# Patient Record
Sex: Male | Born: 1955 | Race: White | Hispanic: No | Marital: Married | Smoking: Never smoker
Health system: Southern US, Community
[De-identification: ages and names within clinical notes are randomized; demographics above are authoritative.]

## PROBLEM LIST (undated history)

## (undated) DIAGNOSIS — K219 Gastro-esophageal reflux disease without esophagitis: Secondary | ICD-10-CM

## (undated) DIAGNOSIS — M47816 Spondylosis without myelopathy or radiculopathy, lumbar region: Secondary | ICD-10-CM

## (undated) DIAGNOSIS — I10 Essential (primary) hypertension: Secondary | ICD-10-CM

## (undated) DIAGNOSIS — M1990 Unspecified osteoarthritis, unspecified site: Secondary | ICD-10-CM

## (undated) HISTORY — PX: KNEE SURGERY: SHX244

## (undated) HISTORY — PX: JOINT REPLACEMENT: SHX530

## (undated) HISTORY — PX: HAND SURGERY: SHX662

## (undated) HISTORY — PX: WRIST FRACTURE SURGERY: SHX121

---

## 2012-08-17 ENCOUNTER — Ambulatory Visit (HOSPITAL_COMMUNITY)
Admission: RE | Admit: 2012-08-17 | Discharge: 2012-08-17 | Disposition: A | Discharge status: Home / Self Care | Payer: BC Managed Care – PPO | Source: Ambulatory Visit | Attending: Family Medicine | Admitting: Family Medicine

## 2012-08-17 ENCOUNTER — Other Ambulatory Visit: Payer: Self-pay | Admitting: Family Medicine

## 2012-08-17 DIAGNOSIS — R05 Cough: Secondary | ICD-10-CM

## 2012-08-17 DIAGNOSIS — R042 Hemoptysis: Secondary | ICD-10-CM | POA: Insufficient documentation

## 2012-09-26 ENCOUNTER — Ambulatory Visit (INDEPENDENT_AMBULATORY_CARE_PROVIDER_SITE_OTHER): Payer: BC Managed Care – PPO | Admitting: Orthopedic Surgery

## 2012-09-26 ENCOUNTER — Encounter: Payer: Self-pay | Admitting: Orthopedic Surgery

## 2012-09-26 ENCOUNTER — Ambulatory Visit (INDEPENDENT_AMBULATORY_CARE_PROVIDER_SITE_OTHER): Payer: BC Managed Care – PPO

## 2012-09-26 DIAGNOSIS — M25579 Pain in unspecified ankle and joints of unspecified foot: Secondary | ICD-10-CM | POA: Insufficient documentation

## 2012-09-26 DIAGNOSIS — M19079 Primary osteoarthritis, unspecified ankle and foot: Secondary | ICD-10-CM | POA: Insufficient documentation

## 2012-09-26 MED ORDER — NABUMETONE 500 MG PO TABS: 500.0000 mg | ORAL_TABLET | Freq: Two times a day (BID) | ORAL | Status: DC

## 2012-09-26 MED ORDER — TRAMADOL-ACETAMINOPHEN 37.5-325 MG PO TABS: 1.0000 | ORAL_TABLET | ORAL | Status: DC | PRN

## 2012-09-26 NOTE — Patient Instructions (Addendum)
Ankle Arthritis  Syndesmosis injury

## 2012-09-26 NOTE — Progress Notes (Signed)
Patient ID: Dillon [name], male   DOB: 11/10/1955, 57 y.o.   MRN: [id number] Chief Complaint  Patient presents with  . 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  He notices that he has some pain when he is ambulating and he has difficulty going up inclines and the swelling is gone is pretty severe and is interfering with shoe wear. Review of systems  His review of systems shows that he has a history of snoring and has a musculoskeletal complaints all others complaints and her review of systems were negative  BP 122/70  Ht 6' (1.829 m)  Wt 145 lb 3.2 oz (65.862 kg)  BMI 19.69 kg/m2 His general appearance is normal is oriented x3 his mood and affect are normal his gait and station show he has a noticeable limp with swelling of his right ankle. He has maintained his ankle dorsiflexion which is painful he has some joint line tenderness anteromedially he has swelling on the soft tissue laterally he does have 20 of dorsiflexion 20 of plantarflexion inversion eversion painless stability normal strength normal skin normal good pulse normal temperature no edema  X-rays were done in the office order was placed and the x-rays were noted to show severe ankle arthritis abnormal mortise tib-fib syndesmotic calcifications and ossifications bone outside the ankle mortise lateral to the malleolus and a old avulsion type fracture the medial malleolus  Impression OA  (osteoarthritis) of ankle, right - Plan: traMADol-acetaminophen (ULTRACET) 37.5-325 MG per tablet, nabumetone (RELAFEN) 500 MG tablet, DISCONTINUED: nabumetone (RELAFEN) 500 MG tablet, DISCONTINUED: traMADol-acetaminophen (ULTRACET) 37.5-325 MG per tablet  Pain in joint, ankle and foot - Plan: DG Ankle Complete Right, traMADol-acetaminophen (ULTRACET) 37.5-325 MG per tablet, nabumetone (RELAFEN) 500 MG tablet, DISCONTINUED: nabumetone (RELAFEN) 500 MG tablet, DISCONTINUED: traMADol-acetaminophen (ULTRACET) 37.5-325 MG per tablet   Recommend soft ASO brace for now anti-inflammatories pain medication and we discussed possible fusion and he asked about joint replacement  I recommend a six-month x-ray and close evaluation regarding his motion and function if he deteriorates we will have to address the possibility of ankle replacement versus ankle effusion

## 2012-12-04 ENCOUNTER — Other Ambulatory Visit: Payer: Self-pay | Admitting: Orthopedic Surgery

## 2012-12-04 DIAGNOSIS — M25579 Pain in unspecified ankle and joints of unspecified foot: Secondary | ICD-10-CM

## 2012-12-04 MED ORDER — TRAMADOL-ACETAMINOPHEN 37.5-325 MG PO TABS: 1.0000 | ORAL_TABLET | ORAL | Status: DC | PRN

## 2013-03-19 ENCOUNTER — Other Ambulatory Visit: Payer: Self-pay | Admitting: *Deleted

## 2013-03-19 MED ORDER — NABUMETONE 500 MG PO TABS: 500.0000 mg | ORAL_TABLET | Freq: Two times a day (BID) | ORAL | Status: DC

## 2013-03-27 ENCOUNTER — Ambulatory Visit: Payer: BC Managed Care – PPO | Admitting: Orthopedic Surgery

## 2013-03-28 ENCOUNTER — Encounter: Payer: Self-pay | Admitting: Orthopedic Surgery

## 2013-03-28 ENCOUNTER — Ambulatory Visit (INDEPENDENT_AMBULATORY_CARE_PROVIDER_SITE_OTHER): Payer: BC Managed Care – PPO | Admitting: Orthopedic Surgery

## 2013-03-28 ENCOUNTER — Ambulatory Visit (INDEPENDENT_AMBULATORY_CARE_PROVIDER_SITE_OTHER): Payer: BC Managed Care – PPO

## 2013-03-28 VITALS — BP 146/92 | Ht 72.0 in | Wt 240.0 lb

## 2013-03-28 DIAGNOSIS — M19071 Primary osteoarthritis, right ankle and foot: Secondary | ICD-10-CM

## 2013-03-28 DIAGNOSIS — M19079 Primary osteoarthritis, unspecified ankle and foot: Secondary | ICD-10-CM

## 2013-03-28 DIAGNOSIS — M25579 Pain in unspecified ankle and joints of unspecified foot: Secondary | ICD-10-CM

## 2013-03-28 MED ORDER — NABUMETONE 500 MG PO TABS: 500.0000 mg | ORAL_TABLET | Freq: Two times a day (BID) | ORAL | Status: DC

## 2013-03-28 MED ORDER — TRAMADOL-ACETAMINOPHEN 37.5-325 MG PO TABS: 1.0000 | ORAL_TABLET | ORAL | Status: DC | PRN

## 2013-03-28 NOTE — Patient Instructions (Addendum)
Continue medications

## 2013-03-28 NOTE — Progress Notes (Signed)
Patient ID: Dillon [name], male   DOB: 08-31-1955, 57 y.o.   MRN: [id number]  Chief Complaint  Patient presents with  . Follow-up    6 month recheck right ankle with xray    Ankle arthritis followup x-ray is from 6 months. 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 X-ray in 6 months. Diagnosis ankle arthritis right ankle

## 2013-10-22 ENCOUNTER — Other Ambulatory Visit: Payer: Self-pay | Admitting: *Deleted

## 2013-10-22 DIAGNOSIS — M1990 Unspecified osteoarthritis, unspecified site: Secondary | ICD-10-CM

## 2013-10-22 MED ORDER — TRAMADOL-ACETAMINOPHEN 37.5-325 MG PO TABS: 1.0000 | ORAL_TABLET | ORAL | Status: DC | PRN

## 2014-03-11 ENCOUNTER — Other Ambulatory Visit: Payer: Self-pay | Admitting: *Deleted

## 2014-03-11 MED ORDER — NABUMETONE 500 MG PO TABS: 500.0000 mg | ORAL_TABLET | Freq: Two times a day (BID) | ORAL | Status: DC

## 2014-03-25 ENCOUNTER — Encounter: Payer: Self-pay | Admitting: Orthopedic Surgery

## 2014-03-25 ENCOUNTER — Ambulatory Visit (INDEPENDENT_AMBULATORY_CARE_PROVIDER_SITE_OTHER): Payer: BC Managed Care – PPO | Admitting: Orthopedic Surgery

## 2014-03-25 ENCOUNTER — Ambulatory Visit (INDEPENDENT_AMBULATORY_CARE_PROVIDER_SITE_OTHER): Payer: BC Managed Care – PPO

## 2014-03-25 VITALS — BP 135/88 | Ht 72.0 in | Wt 241.0 lb

## 2014-03-25 DIAGNOSIS — M1990 Unspecified osteoarthritis, unspecified site: Secondary | ICD-10-CM

## 2014-03-25 DIAGNOSIS — M19079 Primary osteoarthritis, unspecified ankle and foot: Secondary | ICD-10-CM

## 2014-03-25 DIAGNOSIS — M129 Arthropathy, unspecified: Secondary | ICD-10-CM

## 2014-03-25 MED ORDER — TRAMADOL-ACETAMINOPHEN 37.5-325 MG PO TABS: 1.0000 | ORAL_TABLET | ORAL | Status: DC | PRN

## 2014-03-25 NOTE — Progress Notes (Signed)
Followup visit  One year followup for osteoarthritis of the right ankle and tearing ankle joint  Fortunately the patient was able to continue with his activities of daily living and recreational activities which include but are not limited to the following  He walked 18 holes of golf when he wants to. He mows his lawn with a push mower. He coaches. He can do this he is having some pain but is getting good relief from Relafen 500 mg twice a day and Ultracet one to 2 tablets per day  Review of systems he did have some knee pain it was transient and resolved  Fortunately he still has about 20 of total motion in his flexion extension arc minimal pronation supination. Neurovascular exam is intact he walks without assist device  His vital signs are stable his appearance x3 his mood was excellent skin was intact there were no sensory deficits there is deformity of the ankle medial and lateral from extra-articular bone formation  X-rays show deteriorating arthritis of the ankle joint  Recommend continue current medications followup in a year repeat x-ray unless symptoms change

## 2014-04-15 ENCOUNTER — Telehealth: Payer: Self-pay | Admitting: Orthopedic Surgery

## 2014-04-15 ENCOUNTER — Other Ambulatory Visit: Payer: Self-pay | Admitting: *Deleted

## 2014-04-15 MED ORDER — NABUMETONE 500 MG PO TABS: 500.0000 mg | ORAL_TABLET | Freq: Two times a day (BID) | ORAL | Status: DC

## 2014-04-15 NOTE — Telephone Encounter (Signed)
Patient called over holiday weekend and left voice message regarding request for refill on Nabumetone 500 mg, [employer].  Called back to patient - left message, regarding calling pharmacy and ask that they fax a request.

## 2014-04-15 NOTE — Telephone Encounter (Signed)
Ok to refill 

## 2014-09-19 ENCOUNTER — Other Ambulatory Visit: Payer: Self-pay | Admitting: *Deleted

## 2014-09-19 DIAGNOSIS — M1990 Unspecified osteoarthritis, unspecified site: Secondary | ICD-10-CM

## 2014-09-19 MED ORDER — TRAMADOL-ACETAMINOPHEN 37.5-325 MG PO TABS: 1.0000 | ORAL_TABLET | ORAL | Status: DC | PRN

## 2014-09-19 MED ORDER — NABUMETONE 500 MG PO TABS: 500.0000 mg | ORAL_TABLET | Freq: Two times a day (BID) | ORAL | Status: DC

## 2015-01-10 ENCOUNTER — Emergency Department (HOSPITAL_COMMUNITY)
Admission: EM | Admit: 2015-01-10 | Discharge: 2015-01-11 | Disposition: A | Discharge status: Home / Self Care | Payer: BC Managed Care – PPO | Attending: Emergency Medicine | Admitting: Emergency Medicine

## 2015-01-10 ENCOUNTER — Encounter (HOSPITAL_COMMUNITY): Payer: Self-pay | Admitting: *Deleted

## 2015-01-10 DIAGNOSIS — Z79899 Other long term (current) drug therapy: Secondary | ICD-10-CM | POA: Insufficient documentation

## 2015-01-10 DIAGNOSIS — L03116 Cellulitis of left lower limb: Secondary | ICD-10-CM | POA: Diagnosis not present

## 2015-01-10 DIAGNOSIS — R2242 Localized swelling, mass and lump, left lower limb: Secondary | ICD-10-CM | POA: Diagnosis present

## 2015-01-10 DIAGNOSIS — L02416 Cutaneous abscess of left lower limb: Secondary | ICD-10-CM | POA: Diagnosis not present

## 2015-01-10 NOTE — ED Notes (Signed)
Pt ? Insect bite, c/o left leg swelling and is able to "squeeze pus" from area on the back of the calf area,

## 2015-01-10 NOTE — ED Provider Notes (Signed)
CSN: [id number]     Arrival date & time 01/10/15  2248 History  This chart was scribed for Jola Schmidt, MD by Terressa Koyanagi, ED Scribe. This patient was seen in room APA03/APA03 and the patient's care was started at 11:55 PM.  Chief Complaint  Patient presents with  . Leg Swelling   The history is provided by the patient.   PCP: Mickie Hillier, MD HPI Comments: Dillon [name] is a 59 y.o. male, with PMH noted below, who presents to the Emergency Department complaining of sudden onset, mildly improving swelling of the left leg with associated drainage. began yesterday. No hx of DM. Wife became concerned because of the spreading erythema and warmth down his left calf. No signficant pain per pt. No hx of abscesses. No recent injury. Reports he squeezed the area tonight and "pus" came out  History reviewed. No pertinent past medical history. Past Surgical History  Procedure Laterality Date  . Knee surgery    . Wrist fracture surgery     Family History  Problem Relation Age of Onset  . Cancer Father    History  Substance Use Topics  . Smoking status: Never Smoker   . Smokeless tobacco: Not on file  . Alcohol Use: Yes    Review of Systems A complete 10 system review of systems was obtained and all systems are negative except as noted in the HPI and PMH.  Allergies  Review of patient's allergies indicates no known allergies.  Home Medications   Prior to Admission medications   Medication Sig Start Date End Date Taking? Authorizing Provider  nabumetone (RELAFEN) 500 MG tablet Take 1 tablet (500 mg total) by mouth 2 (two) times daily. 09/19/14   [name], MD  traMADol-acetaminophen (ULTRACET) 37.5-325 MG per tablet Take 1 tablet by mouth every 4 (four) hours as needed. 09/19/14   [name], MD   Triage Vitals: BP 160/92 mmHg  Pulse 71  Temp(Src) 97.9 F (36.6 C) (Oral)  Resp 22  Ht 6\' 1"  (1.854 m)  Wt 235 lb (106.595 kg)  BMI 31.01 kg/m2  SpO2 97% Physical  Exam  Constitutional: He is oriented to person, place, and time. He appears well-developed and well-nourished.  HENT:  Head: Normocephalic.  Eyes: EOM are normal.  Neck: Normal range of motion.  Pulmonary/Chest: Effort normal.  Abdominal: He exhibits no distension.  Musculoskeletal: Normal range of motion.  Fluctuant welling left proximal tibia on the lateral aspect, surrounding erythema involving most of this left lower leg from the popliteal space to the left ankle. No crepitus  Neurological: He is alert and oriented to person, place, and time.  Skin:     Psychiatric: He has a normal mood and affect.  Nursing note and vitals reviewed.   ED Course  Procedures  INCISION AND DRAINAGE Performed by:  Jola Schmidt, MD  Consent: Verbal consent obtained. Risks and benefits: risks, benefits and alternatives were discussed Type: abscess Body area: left lower leg Anesthesia: local infiltration Incision was made with a scalpel. Local anesthetic: lidocaine 2% with epinephrine Anesthetic total: 6cc Complexity: complex Blunt dissection to break up loculations Drainage: none Drainage amount: none Packing material: none Patient tolerance: Patient tolerated the procedure well with no immediate complications.    Labs Review Labs Reviewed - No data to display  Imaging Review No results found.   EKG Interpretation None      MDM   Final diagnoses:  Cellulitis of left lower extremity  Abscess of left lower  extremity    Incision and drainage. No additional pus was removed, majority of what was felt was induration. Wound left open. Started on abx for associated cellulitis. pts wife is a Agricultural consultant. Pt and family understand to return to ER for new or worsening symptoms. Home on bactrim  I personally performed the services described in this documentation, which was scribed in my presence. The recorded information has been reviewed and is accurate.      Jola Schmidt,  MD 01/11/15 [phone]

## 2015-01-11 DIAGNOSIS — L03116 Cellulitis of left lower limb: Secondary | ICD-10-CM | POA: Diagnosis not present

## 2015-01-11 MED ORDER — SULFAMETHOXAZOLE-TRIMETHOPRIM 800-160 MG PO TABS
1.0000 | ORAL_TABLET | Freq: Once | ORAL | Status: AC
  Administered 2015-01-11: 1 via ORAL
  Filled 2015-01-11: qty 1

## 2015-01-11 MED ORDER — POVIDONE-IODINE 10 % EX SOLN
CUTANEOUS | Status: AC
  Administered 2015-01-11
  Filled 2015-01-11: qty 118

## 2015-01-11 MED ORDER — SULFAMETHOXAZOLE-TRIMETHOPRIM 800-160 MG PO TABS
1.0000 | ORAL_TABLET | Freq: Two times a day (BID) | ORAL | Status: AC
Start: 2015-01-11 — End: 2015-01-18

## 2015-01-11 MED ORDER — LIDOCAINE-EPINEPHRINE (PF) 2 %-1:200000 IJ SOLN
20.0000 mL | Freq: Once | INTRAMUSCULAR | Status: AC
  Administered 2015-01-11: 20 mL
  Filled 2015-01-11: qty 20

## 2015-01-12 ENCOUNTER — Other Ambulatory Visit: Payer: Self-pay | Admitting: Obstetrics & Gynecology

## 2015-01-12 MED ORDER — SILVER SULFADIAZINE 1 % EX CREA: TOPICAL_CREAM | CUTANEOUS | Status: DC

## 2015-01-12 NOTE — Telephone Encounter (Signed)
Neighbor with boil on left calf, MRSA vs staph complex On bactrim  Recommend topical silver as well and local care instructions given

## 2015-03-16 ENCOUNTER — Other Ambulatory Visit: Payer: Self-pay | Admitting: *Deleted

## 2015-03-16 DIAGNOSIS — M1990 Unspecified osteoarthritis, unspecified site: Secondary | ICD-10-CM

## 2015-03-16 MED ORDER — TRAMADOL-ACETAMINOPHEN 37.5-325 MG PO TABS: 1.0000 | ORAL_TABLET | ORAL | Status: DC | PRN

## 2015-03-16 MED ORDER — NABUMETONE 500 MG PO TABS: 500.0000 mg | ORAL_TABLET | Freq: Two times a day (BID) | ORAL | Status: DC

## 2015-03-31 ENCOUNTER — Ambulatory Visit: Payer: BC Managed Care – PPO | Admitting: Orthopedic Surgery

## 2015-04-02 ENCOUNTER — Ambulatory Visit: Payer: BLUE CROSS/BLUE SHIELD | Admitting: Orthopedic Surgery

## 2015-04-07 ENCOUNTER — Ambulatory Visit (INDEPENDENT_AMBULATORY_CARE_PROVIDER_SITE_OTHER): Payer: BC Managed Care – PPO | Admitting: Orthopedic Surgery

## 2015-04-07 ENCOUNTER — Ambulatory Visit (INDEPENDENT_AMBULATORY_CARE_PROVIDER_SITE_OTHER): Payer: BC Managed Care – PPO

## 2015-04-07 VITALS — BP 145/97 | Ht 73.0 in | Wt 238.0 lb

## 2015-04-07 DIAGNOSIS — M129 Arthropathy, unspecified: Secondary | ICD-10-CM

## 2015-04-07 DIAGNOSIS — M19079 Primary osteoarthritis, unspecified ankle and foot: Secondary | ICD-10-CM

## 2015-04-07 DIAGNOSIS — M1990 Unspecified osteoarthritis, unspecified site: Secondary | ICD-10-CM | POA: Diagnosis not present

## 2015-04-07 MED ORDER — TRAMADOL-ACETAMINOPHEN 37.5-325 MG PO TABS: 1.0000 | ORAL_TABLET | ORAL | Status: DC | PRN

## 2015-04-07 NOTE — Progress Notes (Signed)
Patient ID: Dillon [name], male   DOB: 1956/04/22, 59 y.o.   MRN: [id number]  Follow up visit  Chief Complaint  Patient presents with  . Follow-up    yearly follow up + xray right ankle    BP 145/97 mmHg  Ht 6\' 1"  (1.854 m)  Wt 238 lb (107.956 kg)  BMI 31.41 kg/m2  Encounter Diagnoses  Name Primary?  Marland Kitchen Arthritis of ankle Yes  . Arthritis     Patient comes in for yearly follow-up has severe osteoarthritis of his right ankle. He is on tramadol and nabumetone doing well. 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 He has no tenderness but the ankle joint is swollen and deformed. His plantar flexion dorsiflexion strength remains normal neurovascular exam is intact  Vital signs are stable. His weight is maintained a good level [id number] with a BMI of 31  X-rays show severe arthritis right ankle compared to last year's films no significant change  Continue tramadol nabumetone follow-up in one year repeat x-rays call us if any symptoms change.

## 2015-09-21 ENCOUNTER — Telehealth: Payer: Self-pay

## 2015-09-21 ENCOUNTER — Other Ambulatory Visit: Payer: Self-pay | Admitting: *Deleted

## 2015-09-21 DIAGNOSIS — M1990 Unspecified osteoarthritis, unspecified site: Secondary | ICD-10-CM

## 2015-09-21 MED ORDER — TRAMADOL-ACETAMINOPHEN 37.5-325 MG PO TABS: 1.0000 | ORAL_TABLET | ORAL | Status: DC | PRN

## 2015-09-21 NOTE — Telephone Encounter (Signed)
Refill request from [facility] for Ultracet 37.5/325mg  take 1 every 4 hours prn, #60.  Please advise.

## 2015-09-21 NOTE — Telephone Encounter (Signed)
Last seen August 2016, ok to refill?

## 2015-09-21 NOTE — Telephone Encounter (Signed)
yes

## 2015-09-22 NOTE — Telephone Encounter (Signed)
Faxed as requested

## 2016-01-28 ENCOUNTER — Ambulatory Visit (INDEPENDENT_AMBULATORY_CARE_PROVIDER_SITE_OTHER): Payer: BC Managed Care – PPO | Admitting: Family Medicine

## 2016-01-28 VITALS — BP 122/74 | Temp 98.4°F | Ht 72.0 in | Wt 234.0 lb

## 2016-01-28 DIAGNOSIS — S30860A Insect bite (nonvenomous) of lower back and pelvis, initial encounter: Secondary | ICD-10-CM | POA: Diagnosis not present

## 2016-01-28 DIAGNOSIS — W57XXXA Bitten or stung by nonvenomous insect and other nonvenomous arthropods, initial encounter: Principal | ICD-10-CM

## 2016-01-28 NOTE — Patient Instructions (Signed)
Tick Bite Information Ticks are insects that attach themselves to the skin and draw blood for food. There are various types of ticks. Common types include wood ticks and deer ticks. Most ticks live in shrubs and grassy areas. Ticks can climb onto your body when you make contact with leaves or grass where the tick is waiting. The most common places on the body for ticks to attach themselves are the scalp, neck, armpits, waist, and groin. Most tick bites are harmless, but sometimes ticks carry germs that cause diseases. These germs can be spread to a person during the tick's feeding process. The chance of a disease spreading through a tick bite depends on:   The type of tick.  Time of year.   How long the tick is attached.   Geographic location.  HOW CAN YOU PREVENT TICK BITES? Take these steps to help prevent tick bites when you are outdoors:  Wear protective clothing. Long sleeves and long pants are best.   Wear white clothes so you can see ticks more easily.  Tuck your pant legs into your socks.   If walking on a trail, stay in the middle of the trail to avoid brushing against bushes.  Avoid walking through areas with long grass.  Put insect repellent on all exposed skin and along boot tops, pant legs, and sleeve cuffs.   Check clothing, hair, and skin repeatedly and before going inside.   Brush off any ticks that are not attached.  Take a shower or bath as soon as possible after being outdoors.  WHAT IS THE PROPER WAY TO REMOVE A TICK? Ticks should be removed as soon as possible to help prevent diseases caused by tick bites. 1. If latex gloves are available, put them on before trying to remove a tick.  2. Using fine-point tweezers, grasp the tick as close to the skin as possible. You may also use curved forceps or a tick removal tool. Grasp the tick as close to its head as possible. Avoid grasping the tick on its body. 3. Pull gently with steady upward pressure until  the tick lets go. Do not twist the tick or jerk it suddenly. This may break off the tick's head or mouth parts. 4. Do not squeeze or crush the tick's body. This could force disease-carrying fluids from the tick into your body.  5. After the tick is removed, wash the bite area and your hands with soap and water or other disinfectant such as alcohol. 6. Apply a small amount of antiseptic cream or ointment to the bite site.  7. Wash and disinfect any instruments that were used.  Do not try to remove a tick by applying a hot match, petroleum jelly, or fingernail polish to the tick. These methods do not work and may increase the chances of disease being spread from the tick bite.  WHEN SHOULD YOU SEEK MEDICAL CARE? Contact your health care provider if you are unable to remove a tick from your skin or if a part of the tick breaks off and is stuck in the skin.  After a tick bite, you need to be aware of signs and symptoms that could be related to diseases spread by ticks. Contact your health care provider if you develop any of the following in the days or weeks after the tick bite:  Unexplained fever.  Rash. A circular rash that appears days or weeks after the tick bite may indicate the possibility of Lyme disease. The rash may resemble   a target with a bull's-eye and may occur at a different part of your body than the tick bite.  Redness and swelling in the area of the tick bite.   Tender, swollen lymph glands.   Diarrhea.   Weight loss.   Cough.   Fatigue.   Muscle, joint, or bone pain.   Abdominal pain.   Headache.   Lethargy or a change in your level of consciousness.  Difficulty walking or moving your legs.   Numbness in the legs.   Paralysis.  Shortness of breath.   Confusion.   Repeated vomiting.    This information is not intended to replace advice given to you by your health care provider. Make sure you discuss any questions you have with your health  care provider.   Document Released: 07/22/2000 Document Revised: 08/15/2014 Document Reviewed: 01/02/2013 Elsevier Interactive Patient Education 2016 Else[employer] Inc.  

## 2016-01-28 NOTE — Progress Notes (Signed)
   Subjective:    Patient ID: Dillon [name], male    DOB: 1956-04-14, 60 y.o.   MRN: [id number]  HPIneed to get tick off of back.   The patient relates that he got bit on his back by a small tic. His wife tried to remove it. Left the head and the skin. There is no fever no chills no rash with this happened earlier today no particular troubles in the past with this.  Review of Systems    see above. Objective:   Physical Exam On exam there is no rash noted. There is a small spot on the lower back which is consistent with a tick bite and appears to have part of the tick still embedded This area was too small to use tweezers #15 blade was used to scrape the area to help achieve relief from this area what appeared to be tick head was removed without difficulty       Assessment & Plan:  Tick bite-information verbally given to the patient further information printed and sent to the patient regarding warning signs regarding tick bites and how to remove them daily cleaning with topical antibiotic ointment for the next 45 days recommended if fevers chills rash or unusual issues follow-up

## 2016-03-28 ENCOUNTER — Telehealth: Payer: Self-pay | Admitting: Orthopedic Surgery

## 2016-03-28 NOTE — Telephone Encounter (Signed)
Tramadol HCL-Acetaminophen 37.5/325mg  Qty 60 Tablets   Take 1 tablet by mouth every 4 (four) hours as needed.

## 2016-03-29 ENCOUNTER — Other Ambulatory Visit: Payer: Self-pay | Admitting: *Deleted

## 2016-03-29 DIAGNOSIS — M1990 Unspecified osteoarthritis, unspecified site: Secondary | ICD-10-CM

## 2016-03-29 MED ORDER — TRAMADOL-ACETAMINOPHEN 37.5-325 MG PO TABS: 1.0000 | ORAL_TABLET | ORAL | 5 refills | Status: DC | PRN

## 2016-03-29 NOTE — Telephone Encounter (Signed)
Okay to refill? 

## 2016-03-29 NOTE — Telephone Encounter (Signed)
approved

## 2016-04-06 ENCOUNTER — Ambulatory Visit (INDEPENDENT_AMBULATORY_CARE_PROVIDER_SITE_OTHER): Payer: BC Managed Care – PPO

## 2016-04-06 ENCOUNTER — Ambulatory Visit (INDEPENDENT_AMBULATORY_CARE_PROVIDER_SITE_OTHER): Payer: BC Managed Care – PPO | Admitting: Orthopedic Surgery

## 2016-04-06 ENCOUNTER — Encounter: Payer: Self-pay | Admitting: Orthopedic Surgery

## 2016-04-06 VITALS — BP 145/95 | HR 72 | Ht 72.0 in | Wt 237.0 lb

## 2016-04-06 DIAGNOSIS — M129 Arthropathy, unspecified: Secondary | ICD-10-CM

## 2016-04-06 DIAGNOSIS — M19079 Primary osteoarthritis, unspecified ankle and foot: Secondary | ICD-10-CM

## 2016-04-06 NOTE — Progress Notes (Signed)
Follow-up arthritis right ankle  Patient has osteoarthritis right ankle currently managed with nabumetone and Ultracet  He did not tolerate double upright bracing  He complains of increased swelling right leg asymmetric when compared to left. He does note increased use of elliptical trainer  Review of systems is negative for any numbness or tingling just positive for the swelling  Exam shows an altered gait favoring his right lower extremity with restricted and painful range of motion and swelling of the right leg painful range of motion in the right ankle. The ankle is stable there is no atrophy skin is somewhat warm  There is no edema on the left  Sensation remains intact  X-rays today compared to last year showed no major changes in his talar tilt lateral joint space narrowing and bone spurs  Impression arthritis right ankle  Plan It's time for consultation with a foot and ankle specialists he wants to go to [facility]  Continue nabumetone and Ultracet  Compression stocking  Follow-up one year repeat x-ray

## 2016-04-06 NOTE — Patient Instructions (Signed)
Referral to Dr. Doreatha Lew at [facility]

## 2016-04-07 ENCOUNTER — Ambulatory Visit: Payer: BC Managed Care – PPO | Admitting: Orthopedic Surgery

## 2016-04-20 ENCOUNTER — Telehealth: Payer: Self-pay | Admitting: Orthopedic Surgery

## 2016-04-20 NOTE — Telephone Encounter (Signed)
Patient called to relay - per referral by Dr Aline Brochure, his appointment has been scheduled at [facility] for 05/10/16.  States will request copy of film from [facility] radiology for this visit.

## 2016-06-14 ENCOUNTER — Ambulatory Visit (INDEPENDENT_AMBULATORY_CARE_PROVIDER_SITE_OTHER): Payer: BC Managed Care – PPO | Admitting: Family Medicine

## 2016-06-14 ENCOUNTER — Encounter: Payer: Self-pay | Admitting: Family Medicine

## 2016-06-14 VITALS — BP 136/94 | Ht 72.0 in | Wt 224.5 lb

## 2016-06-14 DIAGNOSIS — Z0000 Encounter for general adult medical examination without abnormal findings: Secondary | ICD-10-CM | POA: Diagnosis not present

## 2016-06-14 DIAGNOSIS — R030 Elevated blood-pressure reading, without diagnosis of hypertension: Secondary | ICD-10-CM

## 2016-06-14 NOTE — Progress Notes (Signed)
   Subjective:    Patient ID: Dillon [name], male    DOB: 05-05-1956, 60 y.o.   MRN: [id number]  HPI The patient comes in today for a wellness visit.  Flu shot given   A review of their health history was completed.  A review of medications was also completed.  Any needed refills; none   Eating habits: Patient states eating habits are ok. Eats power bar for breakfast, apple for lunch, and dinner.  Falls/  MVA accidents in past few months: None Regular exercise: Patient works out 3 days a week. Does push ups, and coaches football.   Specialist pt sees on regular basis: Patient currently Dr.Harrison for ankle   Preventative health issues were discussed.   Additional concerns: Patient states no other concerns tis visit.   Colon o five yrs ago, next on due in ten yrs from that one   Review of Systems  Constitutional: Negative for activity change, appetite change and fever.  HENT: Negative for congestion and rhinorrhea.   Eyes: Negative for discharge.  Respiratory: Negative for cough and wheezing.   Cardiovascular: Negative for chest pain.  Gastrointestinal: Negative for abdominal pain, blood in stool and vomiting.  Genitourinary: Negative for difficulty urinating and frequency.  Musculoskeletal: Negative for neck pain.  Skin: Negative for rash.  Allergic/Immunologic: Negative for environmental allergies and food allergies.  Neurological: Negative for weakness and headaches.  Psychiatric/Behavioral: Negative for agitation.  All other systems reviewed and are negative.      Objective:   Physical Exam  Constitutional: He appears well-developed and well-nourished.  Blood pressure 132/92 on repeat  HENT:  Head: Normocephalic and atraumatic.  Right Ear: External ear normal.  Left Ear: External ear normal.  Nose: Nose normal.  Mouth/Throat: Oropharynx is clear and moist.  Eyes: EOM are normal. Pupils are equal, round, and reactive to light.  Neck: Normal range of motion.  Neck supple. No thyromegaly present.  Cardiovascular: Normal rate, regular rhythm and normal heart sounds.   No murmur heard. Pulmonary/Chest: Effort normal and breath sounds normal. No respiratory distress. He has no wheezes.  Abdominal: Soft. Bowel sounds are normal. He exhibits no distension and no mass. There is no tenderness.  Genitourinary: Penis normal.  Musculoskeletal: Normal range of motion. He exhibits no edema.  Right ankle chronic inflammatory post fracture changes noted  Lymphadenopathy:    He has no cervical adenopathy.  Neurological: He is alert. He exhibits normal muscle tone.  Skin: Skin is warm and dry. No erythema.  Psychiatric: He has a normal mood and affect. His behavior is normal. Judgment normal.  Vitals reviewed.  Prostate within normal limits       Assessment & Plan:  Impression 1 wellness exam #2 elevated blood pressure too early yet to consider medications. Patient feels it is due to his chronic prednisone and other meds which he hopes to stop soon after surgery. #3 pending right ankle surgery plan shingles vaccine. Appropriate blood work. Patient states up to date on colonoscopy

## 2016-06-15 LAB — BASIC METABOLIC PANEL
BUN / CREAT RATIO: 12 (ref 10–24)
BUN: 11 mg/dL (ref 8–27)
CALCIUM: 9.5 mg/dL (ref 8.6–10.2)
CHLORIDE: 102 mmol/L (ref 96–106)
CO2: 26 mmol/L (ref 18–29)
Creatinine, Ser: 0.9 mg/dL (ref 0.76–1.27)
GFR, EST AFRICAN AMERICAN: 107 mL/min/{1.73_m2} (ref >59)
GFR, EST NON AFRICAN AMERICAN: 93 mL/min/{1.73_m2} (ref >59)
Glucose: 87 mg/dL (ref 65–99)
POTASSIUM: 4.2 mmol/L (ref 3.5–5.2)
SODIUM: 144 mmol/L (ref 134–144)

## 2016-06-15 LAB — LIPID PANEL
CHOLESTEROL TOTAL: 191 mg/dL (ref 100–199)
Chol/HDL Ratio: 3.1 ratio units (ref 0.0–5.0)
HDL: 62 mg/dL (ref >39)
LDL CALC: 111 mg/dL — AB (ref 0–99)
TRIGLYCERIDES: 91 mg/dL (ref 0–149)
VLDL Cholesterol Cal: 18 mg/dL (ref 5–40)

## 2016-06-15 LAB — HEPATIC FUNCTION PANEL
ALBUMIN: 4.3 g/dL (ref 3.6–4.8)
ALK PHOS: 81 IU/L (ref 39–117)
ALT: 21 IU/L (ref 0–44)
AST: 19 IU/L (ref 0–40)
BILIRUBIN TOTAL: 0.5 mg/dL (ref 0.0–1.2)
BILIRUBIN, DIRECT: 0.14 mg/dL (ref 0.00–0.40)
Total Protein: 6.6 g/dL (ref 6.0–8.5)

## 2016-06-15 LAB — PSA: Prostate Specific Ag, Serum: 1.5 ng/mL (ref 0.0–4.0)

## 2016-06-17 ENCOUNTER — Encounter: Payer: Self-pay | Admitting: Family Medicine

## 2016-06-29 ENCOUNTER — Other Ambulatory Visit (HOSPITAL_COMMUNITY): Payer: Self-pay | Admitting: Orthopedic Surgery

## 2016-06-29 DIAGNOSIS — M19171 Post-traumatic osteoarthritis, right ankle and foot: Secondary | ICD-10-CM

## 2016-07-07 ENCOUNTER — Ambulatory Visit (HOSPITAL_COMMUNITY)
Admission: RE | Admit: 2016-07-07 | Discharge: 2016-07-07 | Disposition: A | Discharge status: Home / Self Care | Payer: BC Managed Care – PPO | Source: Ambulatory Visit | Attending: Orthopedic Surgery | Admitting: Orthopedic Surgery

## 2016-07-07 DIAGNOSIS — X58XXXA Exposure to other specified factors, initial encounter: Secondary | ICD-10-CM | POA: Insufficient documentation

## 2016-07-07 DIAGNOSIS — S96811A Strain of other specified muscles and tendons at ankle and foot level, right foot, initial encounter: Secondary | ICD-10-CM | POA: Diagnosis not present

## 2016-07-07 DIAGNOSIS — M19171 Post-traumatic osteoarthritis, right ankle and foot: Secondary | ICD-10-CM | POA: Insufficient documentation

## 2016-07-07 DIAGNOSIS — M24071 Loose body in right ankle: Secondary | ICD-10-CM | POA: Diagnosis not present

## 2016-08-30 ENCOUNTER — Encounter (HOSPITAL_COMMUNITY): Payer: Self-pay | Admitting: Physical Therapy

## 2016-08-30 ENCOUNTER — Ambulatory Visit (HOSPITAL_COMMUNITY): Payer: BC Managed Care – PPO | Attending: Orthopedic Surgery | Admitting: Physical Therapy

## 2016-08-30 DIAGNOSIS — R2689 Other abnormalities of gait and mobility: Secondary | ICD-10-CM

## 2016-08-30 DIAGNOSIS — M25671 Stiffness of right ankle, not elsewhere classified: Secondary | ICD-10-CM | POA: Diagnosis not present

## 2016-08-30 DIAGNOSIS — M25571 Pain in right ankle and joints of right foot: Secondary | ICD-10-CM

## 2016-08-30 DIAGNOSIS — R600 Localized edema: Secondary | ICD-10-CM | POA: Diagnosis present

## 2016-08-30 NOTE — Therapy (Signed)
[place], [place], [address] Phone: [phone]   Fax:  [phone]  Physical Therapy Evaluation  Patient Details  Name: Dillon [name] MRN: [id number] Date of Birth: 1956-02-24 Referring Provider: Lennox Grumbles, MD  Encounter Date: 08/30/2016      PT End of Session - 08/30/16 1641    Visit Number 1   Number of Visits 16   Date for PT Re-Evaluation 09/27/16   Authorization Type BCBS   Authorization Time Period 08/30/16 to 10/25/16   PT Start Time 1437   PT Stop Time 1515   PT Time Calculation (min) 38 min   Equipment Utilized During Treatment Other (comment)  Rt CAM boot and axillary crutches   Activity Tolerance Patient tolerated treatment well;No increased pain   Behavior During Therapy [facility] for tasks assessed/performed      History reviewed. No pertinent past medical history.  Past Surgical History:  Procedure Laterality Date  . KNEE SURGERY    . WRIST FRACTURE SURGERY      There were no vitals filed for this visit.       Subjective Assessment - 08/30/16 1439    Subjective Pt reports getting hit on the sidelines during a football game. He had knee surgery, but never realized something was also wrong with is ankle. His ankle has progressively gotten more painful to the point where he could hardly even walk forwards and participate in any leisure activity. He underwent Rt ankel replacement on 07/22/16. He just had an MD appointment yesterday and his cast. He is now in a boot and using crutches.    Pertinent History Rt meniscectomy 7 years ago   Limitations Walking   How long can you sit comfortably? unlimited    How long can you stand comfortably? unlimited, but unable to bear weight    Patient Stated Goals improve mobility and get back to golfing   Currently in Pain? No/denies            [facility] PT Assessment - 08/30/16 0001      Assessment   Medical Diagnosis Rt ankle replacement    Referring Provider Lennox Grumbles, MD   Onset Date/Surgical Date 07/22/16   Next MD Visit 10/10/16   Prior Therapy None      Precautions   Precautions Other (comment)   Precaution Comments ankle replacement protocol     Restrictions   Weight Bearing Restrictions Yes   Other Position/Activity Restrictions see copy of WB protocol in epic     Balance Screen   Has the patient fallen in the past 6 months Yes   How many times? 2  caused by crutches   Has the patient had a decrease in activity level because of a fear of falling?  No   Is the patient reluctant to leave their home because of a fear of falling?  No     Home Ecologist residence     Prior Function   Level of Independence Independent   Vocation Full time employment   Vocation Requirements coaches football      Cognition   Overall Cognitive Status Within Functional Limits for tasks assessed     Observation/Other Assessments   Observations Rt foot pink, swelling noted, surgical incision covering by steri strips      Sensation   Additional Comments numbness and tingling from incision down     ROM / Strength   AROM / PROM / Strength  AROM;Strength     AROM   AROM Assessment Site Ankle   Right/Left Ankle Right   Right Ankle Dorsiflexion --  -5 deg; PROM to 0 deg    Right Ankle Plantar Flexion 5   Right Ankle Inversion 0   Right Ankle Eversion 0     Strength   Strength Assessment Site Ankle   Right/Left Ankle Right   Right Ankle Dorsiflexion 1/5   Right Ankle Plantar Flexion 1/5     Ambulation/Gait   Pre-Gait Activities Ascend/descend 4, 6" steps with B axillary crutches, Rt CAM boot and step to pattern. 1 episode of near fall with self correction during descending portion of activity.   Gait Comments ambulates with B axillary crutches, Rt CAM boot, 25% weight bearing      Balance   Balance Assessed No  unable at this time                   [facility] Adult PT  Treatment/Exercise - 08/30/16 0001      Exercises   Exercises Ankle     Ankle Exercises: Stretches   Soleus Stretch 30 seconds;2 reps   Soleus Stretch Limitations long sitting    Gastroc Stretch 2 reps;30 seconds   Gastroc Stretch Limitations long sitting      Ankle Exercises: Supine   Other Supine Ankle Exercises ankle DF x10 reps, ankle PF x10 reps                 PT Education - 08/30/16 1638    Education provided Yes   Education Details reviewed weight bearing status and purpose of the CAM boot to allow surgical site to heal appropriately; reviewed HEP/exercises provided by MD to address limitations in ankle ROM; proper adjustment of axillary crutches   Person(s) Educated Patient   Methods Explanation;Demonstration;Handout;Verbal cues   Comprehension Returned demonstration;Verbalized understanding          PT Short Term Goals - 08/30/16 1748      PT SHORT TERM GOAL #1   Title Pt will demo consistency and independence with his HEP to improve ankle ROM and strength.    Time 2   Period Weeks   Status New     PT SHORT TERM GOAL #2   Title Pt will demo improved ankle DF PROM to atleast 10 deg, to prepare for ambulation out of the CAM boot.    Time 4   Period Weeks   Status New     PT SHORT TERM GOAL #3   Title Pt will demo understanding of the importance of edema/inflammation management evident by his ability to verbalize ice/elevation parameters.    Time 2   Period Weeks   Status New     PT SHORT TERM GOAL #4   Title Pt will ascend/descend atleast 4, 6" steps with LRAD, 50% weightbearing and without LOB, to improve safety with stair negotiation at home.    Time 2   Period Weeks   Status New           PT Long Term Goals - 08/30/16 1752      PT LONG TERM GOAL #1   Title Pt will demo ankle AROM to atleast 10 deg, to improve foot clearance with ambulation.    Time 8   Period Weeks   Status New     PT LONG TERM GOAL #2   Title Pt will demo improved  ankle PF AROM to atleast 45 degrees to allow him to return  to golfing activities with his peers.   Time 8   Period Weeks   Status New     PT LONG TERM GOAL #3   Title Pt will perform SLS on each LE for up to 15 sec on a firm surface, to demonstrate improved proprioception during weight bearing activity.    Time 8   Period Weeks   Status New     PT LONG TERM GOAL #4   Title Pt will demo ankle DF and PF strength that is atleast 4/5 MMT, to allow him to ambulate without difficulty.   Time 8   Period Weeks   Status New               Plan - 08/30/16 1643    Clinical Impression Statement Pt is a pleasant 60yo M referred to OPPT s/p Rt ankle replacement on 07/22/17. He spent almost 6 weeks in a hard cast which was just removed yesterday, and he was placed in a CAM boot with weight bearing restrictions for the next several weeks. Upon inspection, pt's Rt foot is pink and there is notable swelling, steri strips intact. He demonstrates significant limitations in ankle strength and ROM as expected following immobilization, with no more than 0-5 degrees of DF/PF on the Rt. He is using his crutches with fair stability, reporting 2 falls secondary to poor crutch placement. Otherwise he is following his weight bearing restrictions without cues and demonstrates the ability to don/doff his CAM boot without assistance. He was provided with several exercises by his surgeon and these were reviewed with the exception of a few exercises until further information can be obtained regarding any other restrictions. He would benefit from skilled PT to address his limitations in ROM/strength and mobility to facilitate return to demands of his job, home and leisure activity.    Rehab Potential Good   PT Frequency 2x / week   PT Duration 8 weeks   PT Treatment/Interventions ADLs/Self Care Home Management;Cryotherapy;Therapeutic exercise;Therapeutic activities;Functional mobility training;Stair training;Gait  training;Balance training;Neuromuscular re-education;Patient/family education;Orthotic Fit/Training;Manual techniques;Scar mobilization;Passive range of motion;Dry needling;Taping   PT Next Visit Plan Follow with MD protocol; ankle ROM: DF/PF; manual to address swelling   PT Home Exercise Plan ankle pumps, ankle DF stretch (gastroc and soleus) x30 sec hold   Recommended Other Services possible compression garment for swelling management   Consulted and Agree with Plan of Care Patient      Patient will benefit from skilled therapeutic intervention in order to improve the following deficits and impairments:  Abnormal gait, Decreased activity tolerance, Decreased strength, Increased fascial restricitons, Impaired flexibility, Pain, Improper body mechanics, Increased edema, Decreased scar mobility, Decreased range of motion, Decreased balance, Decreased endurance, Decreased mobility, Decreased skin integrity, Difficulty walking, Hypomobility, Impaired sensation  Visit Diagnosis: Stiffness of right ankle, not elsewhere classified - Plan: PT plan of care cert/re-cert  Pain in right ankle and joints of right foot - Plan: PT plan of care cert/re-cert  Other abnormalities of gait and mobility - Plan: PT plan of care cert/re-cert  Localized edema - Plan: PT plan of care cert/re-cert     Problem List Patient Active Problem List   Diagnosis Date Noted  . OA (osteoarthritis) of ankle 09/26/2012  . Pain in joint, ankle and foot 09/26/2012     6:10 PM,08/30/16 Elly Modena PT, DPT [facility] [place] [address] [place], [place], [address] Phone: [phone]   Fax:  [phone]  Name: Dillon [name] MRN: [id number] Date of Birth: 12-Feb-1956

## 2016-09-01 ENCOUNTER — Telehealth (HOSPITAL_COMMUNITY): Payer: Self-pay | Admitting: Physical Therapy

## 2016-09-01 ENCOUNTER — Ambulatory Visit (HOSPITAL_COMMUNITY): Payer: BC Managed Care – PPO

## 2016-09-01 DIAGNOSIS — M25671 Stiffness of right ankle, not elsewhere classified: Secondary | ICD-10-CM

## 2016-09-01 DIAGNOSIS — R2689 Other abnormalities of gait and mobility: Secondary | ICD-10-CM

## 2016-09-01 DIAGNOSIS — M25571 Pain in right ankle and joints of right foot: Secondary | ICD-10-CM

## 2016-09-01 DIAGNOSIS — R600 Localized edema: Secondary | ICD-10-CM

## 2016-09-01 NOTE — Therapy (Signed)
[place], [place], [address] Phone: [phone]   Fax:  [phone]  Physical Therapy Treatment  Patient Details  Name: Dillon [name] MRN: [id number] Date of Birth: Apr 29, 1956 Referring Provider: Lennox Grumbles, MD  Encounter Date: 09/01/2016      PT End of Session - 09/01/16 1556    Visit Number 2   Number of Visits 16   Date for PT Re-Evaluation 09/27/16   Authorization Type BCBS   Authorization Time Period 08/30/16 to 10/25/16   PT Start Time 1521   PT Stop Time 1600   PT Time Calculation (min) 39 min   Equipment Utilized During Treatment --  Rt CAM boot with axillary crutches   Activity Tolerance Patient tolerated treatment well;No increased pain   Behavior During Therapy [facility] for tasks assessed/performed      No past medical history on file.  Past Surgical History:  Procedure Laterality Date  . KNEE SURGERY    . WRIST FRACTURE SURGERY      There were no vitals filed for this visit.      Subjective Assessment - 09/01/16 1513    Subjective Pt stated he has been compliant with MD therex.  No reoprts of pain currently.  Stated edema has reduced   Pertinent History Rt meniscectomy 7 years ago   Patient Stated Goals improve mobility and get back to golfing   Currently in Pain? No/denies            [facility] Adult PT Treatment/Exercise - 09/01/16 0001      Ambulation/Gait   Pre-Gait Activities Ascend/descend 2 flights of stairs (7in) step to pattern wtih 2 crutches then 1 with handrail A   Gait Comments ambulates with B axillary crutches, Rt CAM boot, 25% weight bearing      Manual Therapy   Manual Therapy Edema management;Passive ROM   Manual therapy comments Manual techniques complete separate rest of tx   Edema Management Retro massage with LE elevated   Passive ROM Passive PROM each direction with prolonged stretches     Ankle Exercises: Seated   ABC's 1 rep   Ankle Circles/Pumps 20  reps  both directions   Other Seated Ankle Exercises 2x 10 DF/PF passive stretches   Other Seated Ankle Exercises 2x 10 Inv/Ev passive stretches     Ankle Exercises: Stretches   Gastroc Stretch 3 reps;30 seconds   Gastroc Stretch Limitations long sitting                   PT Short Term Goals - 08/30/16 1748      PT SHORT TERM GOAL #1   Title Pt will demo consistency and independence with his HEP to improve ankle ROM and strength.    Time 2   Period Weeks   Status New     PT SHORT TERM GOAL #2   Title Pt will demo improved ankle DF PROM to atleast 10 deg, to prepare for ambulation out of the CAM boot.    Time 4   Period Weeks   Status New     PT SHORT TERM GOAL #3   Title Pt will demo understanding of the importance of edema/inflammation management evident by his ability to verbalize ice/elevation parameters.    Time 2   Period Weeks   Status New     PT SHORT TERM GOAL #4   Title Pt will ascend/descend atleast 4, 6" steps with LRAD, 50% weightbearing and without LOB,  to improve safety with stair negotiation at home.    Time 2   Period Weeks   Status New           PT Long Term Goals - 08/30/16 1752      PT LONG TERM GOAL #1   Title Pt will demo ankle AROM to atleast 10 deg, to improve foot clearance with ambulation.    Time 8   Period Weeks   Status New     PT LONG TERM GOAL #2   Title Pt will demo improved ankle PF AROM to atleast 45 degrees to allow him to return to golfing activities with his peers.   Time 8   Period Weeks   Status New     PT LONG TERM GOAL #3   Title Pt will perform SLS on each LE for up to 15 sec on a firm surface, to demonstrate improved proprioception during weight bearing activity.    Time 8   Period Weeks   Status New     PT LONG TERM GOAL #4   Title Pt will demo ankle DF and PF strength that is atleast 4/5 MMT, to allow him to ambulate without difficulty.   Time 8   Period Weeks   Status New                Plan - 09/01/16 1557    Clinical Impression Statement Reviewed complaiance, assured compliance with current MD HEP and copy of eval given to pt.  Session focus on improving ankle mobilty with manual retro massage for edema control, passive stretches and PROM per pt. tolerance.  Noted improveds AROM all directions with PF at 16 degrees, DR at 6 degrees and Inv 4 degrees and Ev 3 degrees following manual.  Reviewed weight bearing status (25% for 1st week) with gait and pt able to demonstrate safe mechanics ascend/descending stairs with 2 then 1 crutch and handrail A.  No reports of pain through session.  Pt encouraged to apply ice following therex for edema and pain    Rehab Potential Good   PT Frequency 2x / week   PT Duration 8 weeks   PT Treatment/Interventions ADLs/Self Care Home Management;Cryotherapy;Therapeutic exercise;Therapeutic activities;Functional mobility training;Stair training;Gait training;Balance training;Neuromuscular re-education;Patient/family education;Orthotic Fit/Training;Manual techniques;Scar mobilization;Passive range of motion;Dry needling;Taping   PT Next Visit Plan Follow with MD protocol; ankle ROM: DF/PF; manual to address swelling   PT Home Exercise Plan ankle pumps, ankle DF stretch (gastroc and soleus) x30 sec hold      Patient will benefit from skilled therapeutic intervention in order to improve the following deficits and impairments:  Abnormal gait, Decreased activity tolerance, Decreased strength, Increased fascial restricitons, Impaired flexibility, Pain, Improper body mechanics, Increased edema, Decreased scar mobility, Decreased range of motion, Decreased balance, Decreased endurance, Decreased mobility, Decreased skin integrity, Difficulty walking, Hypomobility, Impaired sensation  Visit Diagnosis: Stiffness of right ankle, not elsewhere classified  Pain in right ankle and joints of right foot  Other abnormalities of gait and mobility  Localized  edema     Problem List Patient Active Problem List   Diagnosis Date Noted  . OA (osteoarthritis) of ankle 09/26/2012  . Pain in joint, ankle and foot 09/26/2012   Ihor Austin, LPTA; CBIS [phone]  Aldona Lento 09/01/2016, 5:04 PM  [place], [place], [address] Phone: [phone]   Fax:  [phone]  Name: NURI BILLEY MRN: [id number] Date of Birth: Jun 27, 1956

## 2016-09-01 NOTE — Telephone Encounter (Signed)
Spoke with someone from Dr. Susa Simmonds office concerning pt's ankle protocol for rehab. She will reach out to him and get in contact with me with more information once he is available.   3:16 PM,09/01/16 Elly Modena PT, [facility] [phone]

## 2016-09-01 NOTE — Telephone Encounter (Signed)
Attempted to call surgeon requesting more information regarding an ankle protocol for pt's rehab. Left message on machine and provided contact number to return call.  3:06 PM,09/01/16 Francois Elk Kiser PT, [facility] [phone]

## 2016-09-06 ENCOUNTER — Ambulatory Visit (HOSPITAL_COMMUNITY): Payer: BC Managed Care – PPO

## 2016-09-06 DIAGNOSIS — R600 Localized edema: Secondary | ICD-10-CM

## 2016-09-06 DIAGNOSIS — M25671 Stiffness of right ankle, not elsewhere classified: Secondary | ICD-10-CM | POA: Diagnosis not present

## 2016-09-06 DIAGNOSIS — R2689 Other abnormalities of gait and mobility: Secondary | ICD-10-CM

## 2016-09-06 DIAGNOSIS — M25571 Pain in right ankle and joints of right foot: Secondary | ICD-10-CM

## 2016-09-06 NOTE — Therapy (Signed)
[place], [place], [address] Phone: [phone]   Fax:  [phone]  Physical Therapy Treatment  Patient Details  Name: Dillon [name] MRN: [id number] Date of Birth: 1956/02/25 Referring Provider: Lennox Grumbles, MD  Encounter Date: 09/06/2016      PT End of Session - 09/06/16 [id number]    Visit Number 3   Number of Visits 16   Date for PT Re-Evaluation 09/27/16   Authorization Type BCBS   Authorization Time Period 08/30/16 to 10/25/16   PT Start Time 1435   PT Stop Time 1514   PT Time Calculation (min) 39 min   Equipment Utilized During Treatment --  Rt CAM boot and auxillary crutches   Activity Tolerance Patient tolerated treatment well;No increased pain   Behavior During Therapy [facility] for tasks assessed/performed      No past medical history on file.  Past Surgical History:  Procedure Laterality Date  . KNEE SURGERY    . WRIST FRACTURE SURGERY      There were no vitals filed for this visit.      Subjective Assessment - 09/06/16 1435    Subjective No reports of pain today, compliant with HEP.  No real updates.  Edema present.   Pertinent History Rt meniscectomy 7 years ago   Patient Stated Goals improve mobility and get back to golfing   Currently in Pain? No/denies            [facility] PT Assessment - 09/06/16 0001      Assessment   Medical Diagnosis Rt ankle replacement   Referring Provider Lennox Grumbles, MD   Onset Date/Surgical Date 07/22/16   Next MD Visit 10/10/16   Prior Therapy None      AROM   Right Ankle Dorsiflexion 7   Right Ankle Plantar Flexion 18   Right Ankle Inversion 10   Right Ankle Eversion 7                     [facility] Adult PT Treatment/Exercise - 09/06/16 0001      Manual Therapy   Manual Therapy Edema management;Passive ROM   Manual therapy comments Manual techniques complete separate rest of tx   Edema Management Retro massage with LE elevated    Passive ROM Passive PROM each direction with prolonged stretches     Ankle Exercises: Seated   Ankle Circles/Pumps 20 reps   Towel Crunch 1 rep  prolonged time to complete; decreased ROM great toe   Other Seated Ankle Exercises 2x 10 DF/PF passive stretches   Other Seated Ankle Exercises 2x 10 Inv/Ev passive stretches     Ankle Exercises: Stretches   Gastroc Stretch 3 reps;30 seconds   Gastroc Stretch Limitations long sitting    Other Stretch ankle cirles with fitter   Other Stretch pick up marbles 5 large;5 small                  PT Short Term Goals - 08/30/16 1748      PT SHORT TERM GOAL #1   Title Pt will demo consistency and independence with his HEP to improve ankle ROM and strength.    Time 2   Period Weeks   Status New     PT SHORT TERM GOAL #2   Title Pt will demo improved ankle DF PROM to atleast 10 deg, to prepare for ambulation out of the CAM boot.    Time 4   Period Weeks  Status New     PT SHORT TERM GOAL #3   Title Pt will demo understanding of the importance of edema/inflammation management evident by his ability to verbalize ice/elevation parameters.    Time 2   Period Weeks   Status New     PT SHORT TERM GOAL #4   Title Pt will ascend/descend atleast 4, 6" steps with LRAD, 50% weightbearing and without LOB, to improve safety with stair negotiation at home.    Time 2   Period Weeks   Status New           PT Long Term Goals - 08/30/16 1752      PT LONG TERM GOAL #1   Title Pt will demo ankle AROM to atleast 10 deg, to improve foot clearance with ambulation.    Time 8   Period Weeks   Status New     PT LONG TERM GOAL #2   Title Pt will demo improved ankle PF AROM to atleast 45 degrees to allow him to return to golfing activities with his peers.   Time 8   Period Weeks   Status New     PT LONG TERM GOAL #3   Title Pt will perform SLS on each LE for up to 15 sec on a firm surface, to demonstrate improved proprioception during  weight bearing activity.    Time 8   Period Weeks   Status New     PT LONG TERM GOAL #4   Title Pt will demo ankle DF and PF strength that is atleast 4/5 MMT, to allow him to ambulate without difficulty.   Time 8   Period Weeks   Status New               Plan - 09/06/16 1531    Clinical Impression Statement Continued session foucs on improving ankle mobility.  Began session with manual retro massage for edema control and PROM per pt. tolerance.   Overall ankle mobility is improving with all directions.  Added instrinsic strengtheing this session with noted decreased mobilty great toe and increased time to complete towel curls.  Reviewed appropraite weight bearing with crutches, pt at 50% this session, able to demonstrate appropriate weight bearing.  No reports of increased pain through session.     Rehab Potential Good   PT Frequency 2x / week   PT Duration 8 weeks   PT Treatment/Interventions ADLs/Self Care Home Management;Cryotherapy;Therapeutic exercise;Therapeutic activities;Functional mobility training;Stair training;Gait training;Balance training;Neuromuscular re-education;Patient/family education;Orthotic Fit/Training;Manual techniques;Scar mobilization;Passive range of motion;Dry needling;Taping   PT Next Visit Plan Continue therex to improve ankle ROM: DF/PF; manual to address swelling      Patient will benefit from skilled therapeutic intervention in order to improve the following deficits and impairments:  Abnormal gait, Decreased activity tolerance, Decreased strength, Increased fascial restricitons, Impaired flexibility, Pain, Improper body mechanics, Increased edema, Decreased scar mobility, Decreased range of motion, Decreased balance, Decreased endurance, Decreased mobility, Decreased skin integrity, Difficulty walking, Hypomobility, Impaired sensation  Visit Diagnosis: Stiffness of right ankle, not elsewhere classified  Pain in right ankle and joints of right  foot  Other abnormalities of gait and mobility  Localized edema     Problem List Patient Active Problem List   Diagnosis Date Noted  . OA (osteoarthritis) of ankle 09/26/2012  . Pain in joint, ankle and foot 09/26/2012   Ihor Austin, LPTA; [place]  Aldona Lento 09/06/2016, 4:22 PM  [place]  [place], [place], [address] Phone: [phone]   Fax:  [phone]  Name: Dillon [name] MRN: [id number] Date of Birth: 1955-08-17

## 2016-09-08 ENCOUNTER — Ambulatory Visit (HOSPITAL_COMMUNITY): Payer: BC Managed Care – PPO | Attending: Orthopedic Surgery

## 2016-09-08 DIAGNOSIS — M25571 Pain in right ankle and joints of right foot: Secondary | ICD-10-CM

## 2016-09-08 DIAGNOSIS — M25671 Stiffness of right ankle, not elsewhere classified: Secondary | ICD-10-CM | POA: Diagnosis not present

## 2016-09-08 DIAGNOSIS — R2689 Other abnormalities of gait and mobility: Secondary | ICD-10-CM | POA: Diagnosis present

## 2016-09-08 DIAGNOSIS — R600 Localized edema: Secondary | ICD-10-CM

## 2016-09-08 NOTE — Therapy (Signed)
[place], [place], [address] Phone: [phone]   Fax:  [phone]  Physical Therapy Treatment  Patient Details  Name: Dillon [name] MRN: [id number] Date of Birth: [birth year] Referring Provider: Lennox Grumbles, MD  Encounter Date: 09/08/2016      PT End of Session - 09/08/16 1544    Visit Number 4   Number of Visits 16   Date for PT Re-Evaluation 09/27/16   Authorization Type BCBS   Authorization Time Period 08/30/16 to 10/25/16   PT Start Time 1512   PT Stop Time 1601   PT Time Calculation (min) 49 min   Equipment Utilized During Treatment --  Rt CAM boot and bilateral axillary crutches   Activity Tolerance Patient tolerated treatment well;No increased pain   Behavior During Therapy [facility] for tasks assessed/performed      No past medical history on file.  Past Surgical History:  Procedure Laterality Date  . KNEE SURGERY    . WRIST FRACTURE SURGERY      There were no vitals filed for this visit.      Subjective Assessment - 09/08/16 1515    Subjective No reports of pain today, complance with HEP.  No real updates continues to ambulate with brace and bil crutches with 50% weight bearing            [facility] PT Assessment - 09/08/16 0001      AROM   Right Ankle Dorsiflexion 8   Right Ankle Plantar Flexion 26   Right Ankle Inversion 10   Right Ankle Eversion 8                     [facility] Adult PT Treatment/Exercise - 09/08/16 0001      Manual Therapy   Manual Therapy Edema management;Passive ROM   Manual therapy comments Manual techniques complete separate rest of tx   Edema Management Retro massage with LE elevated   Passive ROM Passive PROM each direction with prolonged stretches     Ankle Exercises: Stretches   Gastroc Stretch 3 reps;30 seconds   Gastroc Stretch Limitations long sitting    Other Stretch DIP passive strech 3x30"   Other Stretch PIP passive stretch 3x 30"      Ankle Exercises: Seated   Ankle Circles/Pumps 20 reps   Marble Pickup 10 marbles (5 big, 5 small)   Other Seated Ankle Exercises 2x 10 DF/PF passive stretches   Other Seated Ankle Exercises 2x 10 Inv/Ev passive stretches                  PT Short Term Goals - 08/30/16 1748      PT SHORT TERM GOAL #1   Title Pt will demo consistency and independence with his HEP to improve ankle ROM and strength.    Time 2   Period Weeks   Status New     PT SHORT TERM GOAL #2   Title Pt will demo improved ankle DF PROM to atleast 10 deg, to prepare for ambulation out of the CAM boot.    Time 4   Period Weeks   Status New     PT SHORT TERM GOAL #3   Title Pt will demo understanding of the importance of edema/inflammation management evident by his ability to verbalize ice/elevation parameters.    Time 2   Period Weeks   Status New     PT SHORT TERM GOAL #4   Title Pt will  ascend/descend atleast 4, 6" steps with LRAD, 50% weightbearing and without LOB, to improve safety with stair negotiation at home.    Time 2   Period Weeks   Status New           PT Long Term Goals - 08/30/16 1752      PT LONG TERM GOAL #1   Title Pt will demo ankle AROM to atleast 10 deg, to improve foot clearance with ambulation.    Time 8   Period Weeks   Status New     PT LONG TERM GOAL #2   Title Pt will demo improved ankle PF AROM to atleast 45 degrees to allow him to return to golfing activities with his peers.   Time 8   Period Weeks   Status New     PT LONG TERM GOAL #3   Title Pt will perform SLS on each LE for up to 15 sec on a firm surface, to demonstrate improved proprioception during weight bearing activity.    Time 8   Period Weeks   Status New     PT LONG TERM GOAL #4   Title Pt will demo ankle DF and PF strength that is atleast 4/5 MMT, to allow him to ambulate without difficulty.   Time 8   Period Weeks   Status New               Plan - 09/08/16 1544    Clinical  Impression Statement Continued session focus on improving ankle mobiltiy.  Began session with manual retro massage for edema control, PROM all directions as tolerated and joint mobs to improve dorsiflexion.  Overall ankle mobiltiy is improving all directions.  Continued with intrinsic musculature strengthening with addition of arch supporting exercise.  Noted most difficulty with towel crunches partially due to reduced mobility with great toe.  Added great toe flexion stretches to improve mobilty of DIP and PIP joints.  No reoprts of pain through session.     Rehab Potential Good   PT Frequency 2x / week   PT Duration 8 weeks   PT Treatment/Interventions ADLs/Self Care Home Management;Cryotherapy;Therapeutic exercise;Therapeutic activities;Functional mobility training;Stair training;Gait training;Balance training;Neuromuscular re-education;Patient/family education;Orthotic Fit/Training;Manual techniques;Scar mobilization;Passive range of motion;Dry needling;Taping   PT Next Visit Plan Continue therex to improve ankle ROM: DF/PF; manual to address swelling      Patient will benefit from skilled therapeutic intervention in order to improve the following deficits and impairments:  Abnormal gait, Decreased activity tolerance, Decreased strength, Increased fascial restricitons, Impaired flexibility, Pain, Improper body mechanics, Increased edema, Decreased scar mobility, Decreased range of motion, Decreased balance, Decreased endurance, Decreased mobility, Decreased skin integrity, Difficulty walking, Hypomobility, Impaired sensation  Visit Diagnosis: Stiffness of right ankle, not elsewhere classified  Pain in right ankle and joints of right foot  Other abnormalities of gait and mobility  Localized edema     Problem List Patient Active Problem List   Diagnosis Date Noted  . OA (osteoarthritis) of ankle 09/26/2012  . Pain in joint, ankle and foot 09/26/2012   Ihor Austin, [place];  CBIS [phone]  Aldona Lento 09/08/2016, 4:49 PM  [place] [address] [place], [place], [address] Phone: [phone]   Fax:  [phone]  Name: ALFIO ALLSUP MRN: [id number] Date of Birth: 1956/03/06

## 2016-09-13 ENCOUNTER — Ambulatory Visit (HOSPITAL_COMMUNITY): Payer: BC Managed Care – PPO | Admitting: Physical Therapy

## 2016-09-13 DIAGNOSIS — M25671 Stiffness of right ankle, not elsewhere classified: Secondary | ICD-10-CM

## 2016-09-13 DIAGNOSIS — M25571 Pain in right ankle and joints of right foot: Secondary | ICD-10-CM

## 2016-09-13 DIAGNOSIS — R2689 Other abnormalities of gait and mobility: Secondary | ICD-10-CM

## 2016-09-13 DIAGNOSIS — R600 Localized edema: Secondary | ICD-10-CM

## 2016-09-13 NOTE — Therapy (Signed)
[place], [place], [address] Phone: [phone]   Fax:  [phone]  Physical Therapy Treatment  Patient Details  Name: Dillon [name] MRN: [id number] Date of Birth: 1956-03-26 Referring Provider: Lennox Grumbles, MD  Encounter Date: 09/13/2016      PT End of Session - 09/13/16 1530    Visit Number 5   Number of Visits 16   Date for PT Re-Evaluation 09/27/16   Authorization Type BCBS   Authorization Time Period 08/30/16 to 10/25/16   PT Start Time 1431   PT Stop Time 1520   PT Time Calculation (min) 49 min   Equipment Utilized During Treatment --  Rt CAM boot and bilateral axillary crutches   Activity Tolerance Patient tolerated treatment well;No increased pain   Behavior During Therapy [facility] for tasks assessed/performed      No past medical history on file.  Past Surgical History:  Procedure Laterality Date  . KNEE SURGERY    . WRIST FRACTURE SURGERY      There were no vitals filed for this visit.      Subjective Assessment - 09/13/16 1437    Subjective Pt reports things are going well. He continues to perform his HEP and has no complaints.    Currently in Pain? No/denies                         [facility] Adult PT Treatment/Exercise - 09/13/16 0001      Ambulation/Gait   Ambulation/Gait Yes   Ambulation/Gait Assistance 5: Supervision   Gait Comments Pt ambulating with 75% weight bearing, with Lt axillary crutch x47ft (+) lean to the Lt with therapist providing verbal cues for improved upright posturing. Pt instructed to lead with his Lt leg when stepping up onto a curb or ascending steps      Manual Therapy   Manual Therapy Joint mobilization;Passive ROM   Manual therapy comments separate rest of the session    Joint Mobilization Grade III/IV AP talocrural jt mobs    Passive ROM Rt DF/PF stretch 5x20 sec      Ankle Exercises: Seated   Other Seated Ankle Exercises Rt 1st MTP  stretch 10x10 sec    Other Seated Ankle Exercises ankle PF with blue TB x15 reps      Ankle Exercises: Stretches   Gastroc Stretch 5 reps;30 seconds   Gastroc Stretch Limitations RLE only with towel                 PT Education - 09/13/16 1529    Education provided Yes   Education Details reviewed 75% weight bearing status according to MD protocol; updated HEP; instructed pt in proper way to use 1 axillary crutch when ambulating and ascending/descending stairs    Person(s) Educated Patient   Methods Explanation;Demonstration;Verbal cues;Handout   Comprehension Verbalized understanding;Returned demonstration          PT Short Term Goals - 08/30/16 1748      PT SHORT TERM GOAL #1   Title Pt will demo consistency and independence with his HEP to improve ankle ROM and strength.    Time 2   Period Weeks   Status New     PT SHORT TERM GOAL #2   Title Pt will demo improved ankle DF PROM to atleast 10 deg, to prepare for ambulation out of the CAM boot.    Time 4   Period Weeks   Status New  PT SHORT TERM GOAL #3   Title Pt will demo understanding of the importance of edema/inflammation management evident by his ability to verbalize ice/elevation parameters.    Time 2   Period Weeks   Status New     PT SHORT TERM GOAL #4   Title Pt will ascend/descend atleast 4, 6" steps with LRAD, 50% weightbearing and without LOB, to improve safety with stair negotiation at home.    Time 2   Period Weeks   Status New           PT Long Term Goals - 08/30/16 1752      PT LONG TERM GOAL #1   Title Pt will demo ankle AROM to atleast 10 deg, to improve foot clearance with ambulation.    Time 8   Period Weeks   Status New     PT LONG TERM GOAL #2   Title Pt will demo improved ankle PF AROM to atleast 45 degrees to allow him to return to golfing activities with his peers.   Time 8   Period Weeks   Status New     PT LONG TERM GOAL #3   Title Pt will perform SLS on each LE  for up to 15 sec on a firm surface, to demonstrate improved proprioception during weight bearing activity.    Time 8   Period Weeks   Status New     PT LONG TERM GOAL #4   Title Pt will demo ankle DF and PF strength that is atleast 4/5 MMT, to allow him to ambulate without difficulty.   Time 8   Period Weeks   Status New               Plan - 09/13/16 1531    Clinical Impression Statement Pt continues to perform his HEP regularly, demonstrating improvements in ankle ROM. Session continued with exercise to improve ankle and foot mobility/strength. Performed manual techniques to improve ankle ROM and updated his HEP to further address this at home. Ended session without any report of pain and educated pt on 75% weight bearing status with ambulation and stair negotiation. Pt was able to demonstrate proper technique.    Rehab Potential Good   PT Frequency 2x / week   PT Duration 8 weeks   PT Treatment/Interventions ADLs/Self Care Home Management;Cryotherapy;Therapeutic exercise;Therapeutic activities;Functional mobility training;Stair training;Gait training;Balance training;Neuromuscular re-education;Patient/family education;Orthotic Fit/Training;Manual techniques;Scar mobilization;Passive range of motion;Dry needling;Taping   PT Next Visit Plan ankle DF ROM/ PF ROM, edema control, introduce BAPS seated   PT Home Exercise Plan ankle PF/DF with blue TB, ankle DF stretch with strap 3x30 sec, ankle PF stretch 5x10 sec, great toe extension stretch 5x10 sec    Consulted and Agree with Plan of Care Patient      Patient will benefit from skilled therapeutic intervention in order to improve the following deficits and impairments:  Abnormal gait, Decreased activity tolerance, Decreased strength, Increased fascial restricitons, Impaired flexibility, Pain, Improper body mechanics, Increased edema, Decreased scar mobility, Decreased range of motion, Decreased balance, Decreased endurance, Decreased  mobility, Decreased skin integrity, Difficulty walking, Hypomobility, Impaired sensation  Visit Diagnosis: Stiffness of right ankle, not elsewhere classified  Pain in right ankle and joints of right foot  Other abnormalities of gait and mobility  Localized edema     Problem List Patient Active Problem List   Diagnosis Date Noted  . OA (osteoarthritis) of ankle 09/26/2012  . Pain in joint, ankle and foot 09/26/2012    3:46  PM,09/13/16 [place], DPT [facility] [place] [address] [place], [place], [address] Phone: [phone]   Fax:  [phone]  Name: Dillon [name] MRN: [id number] Date of Birth: Dec 19, 1955

## 2016-09-15 ENCOUNTER — Ambulatory Visit (HOSPITAL_COMMUNITY): Payer: BC Managed Care – PPO | Admitting: Physical Therapy

## 2016-09-15 DIAGNOSIS — M25671 Stiffness of right ankle, not elsewhere classified: Secondary | ICD-10-CM | POA: Diagnosis not present

## 2016-09-15 DIAGNOSIS — R600 Localized edema: Secondary | ICD-10-CM

## 2016-09-15 DIAGNOSIS — M25571 Pain in right ankle and joints of right foot: Secondary | ICD-10-CM

## 2016-09-15 DIAGNOSIS — R2689 Other abnormalities of gait and mobility: Secondary | ICD-10-CM

## 2016-09-15 NOTE — Therapy (Signed)
[place], [place], [address] Phone: [phone]   Fax:  [phone]  Physical Therapy Treatment  Patient Details  Name: Dillon [name] MRN: [id number] Date of Birth: 12/03/1955 Referring Provider: Lennox Grumbles, MD  Encounter Date: 09/15/2016      PT End of Session - 09/15/16 1537    Visit Number 6   Number of Visits 16   Date for PT Re-Evaluation 09/27/16   Authorization Type BCBS   Authorization Time Period 08/30/16 to 10/25/16   PT Start Time 1432   PT Stop Time 1517   PT Time Calculation (min) 45 min   Equipment Utilized During Treatment --  Rt cam boot and 1 crutch   Activity Tolerance Patient tolerated treatment well;No increased pain   Behavior During Therapy [facility] for tasks assessed/performed      No past medical history on file.  Past Surgical History:  Procedure Laterality Date  . KNEE SURGERY    . WRIST FRACTURE SURGERY      There were no vitals filed for this visit.      Subjective Assessment - 09/15/16 1438    Subjective Pt reports that things are going well. His heel is a little sore when he walks, but thinks this is because he walks some with his foot turned out.    Pertinent History Rt meniscectomy 7 years ago   Currently in Pain? No/denies            [facility] PT Assessment - 09/15/16 0001      AROM   Right Ankle Dorsiflexion 6   Right Ankle Plantar Flexion 32                     [facility] Adult PT Treatment/Exercise - 09/15/16 0001      Manual Therapy   Joint Mobilization Grade III/IV Rt MTP PA mobs    Passive ROM Rt DF stretch 5x20 sec; PF stretch 10x20 sec      Ankle Exercises: Stretches   Gastroc Stretch 5 reps;20 seconds   Gastroc Stretch Limitations x2 rounds alternating with PF stretch    Other Stretch Rt PF stretch with towel 2x5reps, 20 sec hold      Ankle Exercises: Seated   Heel Raises 20 reps  x2 sets    Toe Raise 20 reps;Other (comment)  x2 sets     BAPS Sitting;Other (comment)  2x1 min alternating CW and CCW   Other Seated Ankle Exercises big toe extension x15 reps, Rt    Other Seated Ankle Exercises gross toe abduction x15 attempts                 PT Education - 09/15/16 1536    Education provided Yes   Education Details encouraged continued HEP adherence and taking time while at work to work on non weight bearing ankle strengthening activity    Person(s) Educated Patient   Methods Explanation   Comprehension Verbalized understanding          PT Short Term Goals - 08/30/16 1748      PT SHORT TERM GOAL #1   Title Pt will demo consistency and independence with his HEP to improve ankle ROM and strength.    Time 2   Period Weeks   Status New     PT SHORT TERM GOAL #2   Title Pt will demo improved ankle DF PROM to atleast 10 deg, to prepare for ambulation out of the CAM  boot.    Time 4   Period Weeks   Status New     PT SHORT TERM GOAL #3   Title Pt will demo understanding of the importance of edema/inflammation management evident by his ability to verbalize ice/elevation parameters.    Time 2   Period Weeks   Status New     PT SHORT TERM GOAL #4   Title Pt will ascend/descend atleast 4, 6" steps with LRAD, 50% weightbearing and without LOB, to improve safety with stair negotiation at home.    Time 2   Period Weeks   Status New           PT Long Term Goals - 08/30/16 1752      PT LONG TERM GOAL #1   Title Pt will demo ankle AROM to atleast 10 deg, to improve foot clearance with ambulation.    Time 8   Period Weeks   Status New     PT LONG TERM GOAL #2   Title Pt will demo improved ankle PF AROM to atleast 45 degrees to allow him to return to golfing activities with his peers.   Time 8   Period Weeks   Status New     PT LONG TERM GOAL #3   Title Pt will perform SLS on each LE for up to 15 sec on a firm surface, to demonstrate improved proprioception during weight bearing activity.    Time  8   Period Weeks   Status New     PT LONG TERM GOAL #4   Title Pt will demo ankle DF and PF strength that is atleast 4/5 MMT, to allow him to ambulate without difficulty.   Time 8   Period Weeks   Status New               Plan - 09/15/16 1538    Clinical Impression Statement Continued this session with manual stretching and therex to improve ankle ROM and strength. Noting improvements in ankle DF AROM, however ankle DF remains limited. Introduced seated BAPS this visit, pt reporting this is much easier than when he initially performed this. No pain reported by the end of the session.   Rehab Potential Good   PT Frequency 2x / week   PT Duration 8 weeks   PT Treatment/Interventions ADLs/Self Care Home Management;Cryotherapy;Therapeutic exercise;Therapeutic activities;Functional mobility training;Stair training;Gait training;Balance training;Neuromuscular re-education;Patient/family education;Orthotic Fit/Training;Manual techniques;Scar mobilization;Passive range of motion;Dry needling;Taping   PT Next Visit Plan seated ankle strength, manual to address DF/inversion/eversion limitations   PT Home Exercise Plan ankle PF/DF with blue TB, ankle DF stretch with strap 3x30 sec, ankle PF stretch 5x10 sec, great toe extension stretch 5x10 sec    Consulted and Agree with Plan of Care Patient      Patient will benefit from skilled therapeutic intervention in order to improve the following deficits and impairments:  Abnormal gait, Decreased activity tolerance, Decreased strength, Increased fascial restricitons, Impaired flexibility, Pain, Improper body mechanics, Increased edema, Decreased scar mobility, Decreased range of motion, Decreased balance, Decreased endurance, Decreased mobility, Decreased skin integrity, Difficulty walking, Hypomobility, Impaired sensation  Visit Diagnosis: Stiffness of right ankle, not elsewhere classified  Pain in right ankle and joints of right foot  Other  abnormalities of gait and mobility  Localized edema     Problem List Patient Active Problem List   Diagnosis Date Noted  . OA (osteoarthritis) of ankle 09/26/2012  . Pain in joint, ankle and foot 09/26/2012   3:48 PM,09/15/16  Elly Modena PT, DPT [facility] [place] [address] [place], [place], [address] Phone: [phone]   Fax:  [phone]  Name: Dillon [name] MRN: [id number] Date of Birth: 09/29/1955

## 2016-09-20 ENCOUNTER — Ambulatory Visit (HOSPITAL_COMMUNITY): Payer: BC Managed Care – PPO

## 2016-09-20 DIAGNOSIS — M25571 Pain in right ankle and joints of right foot: Secondary | ICD-10-CM

## 2016-09-20 DIAGNOSIS — M25671 Stiffness of right ankle, not elsewhere classified: Secondary | ICD-10-CM | POA: Diagnosis not present

## 2016-09-20 DIAGNOSIS — R600 Localized edema: Secondary | ICD-10-CM

## 2016-09-20 DIAGNOSIS — R2689 Other abnormalities of gait and mobility: Secondary | ICD-10-CM

## 2016-09-20 NOTE — Therapy (Signed)
[place], [place], [address] Phone: [phone]   Fax:  [phone]  Physical Therapy Treatment  Patient Details  Name: Dillon [name] MRN: [id number] Date of Birth: 07/22/1956 Referring Provider: Lennox Grumbles, MD  Encounter Date: 09/20/2016      PT End of Session - 09/20/16 1438    Visit Number 7   Number of Visits 16   Date for PT Re-Evaluation 09/27/16   Authorization Type BCBS   Authorization Time Period 08/30/16 to 10/25/16   PT Start Time 1430   PT Stop Time 1510   PT Time Calculation (min) 40 min   Equipment Utilized During Treatment --  Rt CAM boot, 1 crutch   Activity Tolerance Patient tolerated treatment well;No increased pain   Behavior During Therapy [facility] for tasks assessed/performed      No past medical history on file.  Past Surgical History:  Procedure Laterality Date  . KNEE SURGERY    . WRIST FRACTURE SURGERY      There were no vitals filed for this visit.      Subjective Assessment - 09/20/16 1437    Subjective Pt stated he is feeling good today, no reports of pain today.     Pertinent History Rt meniscectomy 7 years ago   Patient Stated Goals improve mobility and get back to golfing   Currently in Pain? No/denies            [facility] PT Assessment - 09/20/16 0001      Assessment   Medical Diagnosis Rt ankle replacement   Referring Provider Lennox Grumbles, MD   Onset Date/Surgical Date 07/22/16   Next MD Visit 10/10/16   Prior Therapy None      AROM   Right Ankle Dorsiflexion 6  was 6   Right Ankle Plantar Flexion 32  was 32   Right Ankle Inversion 10  was 10   Right Ankle Eversion 8  was 8                      [facility] Adult PT Treatment/Exercise - 09/20/16 0001      Ambulation/Gait   Ambulation/Gait (P)  Yes   Ambulation/Gait Assistance (P)  5: Supervision     Manual Therapy   Manual Therapy (P)  Joint mobilization;Passive ROM   Manual therapy  comments (P)  separate rest of the session    Edema Management (P)  Retro massage with LE elevated   Joint Mobilization (P)  Grade III/IV Rt MTP PA mobs    Passive ROM (P)  Rt DF stretch 5x20 sec; PF stretch 10x20 sec      Ankle Exercises: Stretches   Soleus Stretch 30 seconds;2 reps   Soleus Stretch Limitations long sitting    Gastroc Stretch 5 reps;20 seconds   Gastroc Stretch Limitations x2 rounds alternating with PF stretch    Other Stretch Rt PF stretch with towel 2x5reps, 20 sec hold      Ankle Exercises: Seated   Ankle Circles/Pumps (P)  20 reps   Towel Inversion/Eversion 3 reps  cueing to reduce compensation wiht assistance of knee   Heel Raises 20 reps   Toe Raise 20 reps   BAPS Sitting;Other (comment)  20x CW/CCW                   PT Short Term Goals - 08/30/16 1748      PT SHORT TERM GOAL #1   Title Pt  will demo consistency and independence with his HEP to improve ankle ROM and strength.    Time 2   Period Weeks   Status New     PT SHORT TERM GOAL #2   Title Pt will demo improved ankle DF PROM to atleast 10 deg, to prepare for ambulation out of the CAM boot.    Time 4   Period Weeks   Status New     PT SHORT TERM GOAL #3   Title Pt will demo understanding of the importance of edema/inflammation management evident by his ability to verbalize ice/elevation parameters.    Time 2   Period Weeks   Status New     PT SHORT TERM GOAL #4   Title Pt will ascend/descend atleast 4, 6" steps with LRAD, 50% weightbearing and without LOB, to improve safety with stair negotiation at home.    Time 2   Period Weeks   Status New           PT Long Term Goals - 08/30/16 1752      PT LONG TERM GOAL #1   Title Pt will demo ankle AROM to atleast 10 deg, to improve foot clearance with ambulation.    Time 8   Period Weeks   Status New     PT LONG TERM GOAL #2   Title Pt will demo improved ankle PF AROM to atleast 45 degrees to allow him to return to golfing  activities with his peers.   Time 8   Period Weeks   Status New     PT LONG TERM GOAL #3   Title Pt will perform SLS on each LE for up to 15 sec on a firm surface, to demonstrate improved proprioception during weight bearing activity.    Time 8   Period Weeks   Status New     PT LONG TERM GOAL #4   Title Pt will demo ankle DF and PF strength that is atleast 4/5 MMT, to allow him to ambulate without difficulty.   Time 8   Period Weeks   Status New               Plan - 09/20/16 1511    Clinical Impression Statement Continued session focus on ankle mobility with manual stretches and therex to improve ROM.  Pt making small gains with ROM, continues to be limited all directions.  Added Inv/Ev to improve mobility.  Pt at 4 week protocol, educated on weaning out of crutches.  No reports of increased pain through session.     Rehab Potential Good   PT Frequency 2x / week   PT Duration 8 weeks   PT Treatment/Interventions ADLs/Self Care Home Management;Cryotherapy;Therapeutic exercise;Therapeutic activities;Functional mobility training;Stair training;Gait training;Balance training;Neuromuscular re-education;Patient/family education;Orthotic Fit/Training;Manual techniques;Scar mobilization;Passive range of motion;Dry needling;Taping   PT Next Visit Plan seated ankle strength, manual to address DF/inversion/eversion limitations; Next session to wean out of boot, begin standing on foam next session.     PT Home Exercise Plan ankle PF/DF with blue TB, ankle DF stretch with strap 3x30 sec, ankle PF stretch 5x10 sec, great toe extension stretch 5x10 sec       Patient will benefit from skilled therapeutic intervention in order to improve the following deficits and impairments:  Abnormal gait, Decreased activity tolerance, Decreased strength, Increased fascial restricitons, Impaired flexibility, Pain, Improper body mechanics, Increased edema, Decreased scar mobility, Decreased range of motion,  Decreased balance, Decreased endurance, Decreased mobility, Decreased skin integrity, Difficulty walking, Hypomobility,  Impaired sensation  Visit Diagnosis: Stiffness of right ankle, not elsewhere classified  Pain in right ankle and joints of right foot  Other abnormalities of gait and mobility  Localized edema     Problem List Patient Active Problem List   Diagnosis Date Noted  . OA (osteoarthritis) of ankle 09/26/2012  . Pain in joint, ankle and foot 09/26/2012   Ihor Austin, LPTA; CBIS [phone]  Aldona Lento 09/20/2016, 3:19 PM  [place], [place], [address] Phone: [phone]   Fax:  [phone]  Name: Dillon [name] MRN: [id number] Date of Birth: Sep 02, 1955

## 2016-09-22 ENCOUNTER — Ambulatory Visit (HOSPITAL_COMMUNITY): Payer: BC Managed Care – PPO | Admitting: Physical Therapy

## 2016-09-22 DIAGNOSIS — M25571 Pain in right ankle and joints of right foot: Secondary | ICD-10-CM

## 2016-09-22 DIAGNOSIS — R2689 Other abnormalities of gait and mobility: Secondary | ICD-10-CM

## 2016-09-22 DIAGNOSIS — M25671 Stiffness of right ankle, not elsewhere classified: Secondary | ICD-10-CM | POA: Diagnosis not present

## 2016-09-22 DIAGNOSIS — R600 Localized edema: Secondary | ICD-10-CM

## 2016-09-22 NOTE — Therapy (Signed)
[name], [place], [address] Phone: [phone]   Fax:  [phone]  Physical Therapy Treatment  Patient Details  Name: Dillon [name] MRN: [id number] Date of Birth: [birth year] Referring Provider: Lennox Grumbles, MD  Encounter Date: 09/22/2016      PT End of Session - 09/22/16 1540    Visit Number 8   Number of Visits 16   Date for PT Re-Evaluation 09/27/16   Authorization Type BCBS   Authorization Time Period 08/30/16 to 10/25/16   PT Start Time 1431   PT Stop Time 1516   PT Time Calculation (min) 45 min   Equipment Utilized During Treatment --  Rt CAM boot   Activity Tolerance Patient tolerated treatment well;No increased pain   Behavior During Therapy [facility] for tasks assessed/performed      No past medical history on file.  Past Surgical History:  Procedure Laterality Date  . KNEE SURGERY    . WRIST FRACTURE SURGERY      There were no vitals filed for this visit.      Subjective Assessment - 09/22/16 1432    Subjective (P)  Pt stated he is feeling good today, no reports of pain today aside from heel soreness with walking.    Pertinent History (P)  Rt meniscectomy 7 years ago   Patient Stated Goals (P)  improve mobility and get back to golfing                         [facility] Adult PT Treatment/Exercise - 09/22/16 0001      Manual Therapy   Manual Therapy Joint mobilization   Manual therapy comments separate rest of the session    Joint Mobilization Grade III/IV AP and PA talocrural jt mobs, Grade III/IV medial and lateral subtalar joint mobs   Passive ROM Rt DF stretch 4x20 sec, knee extended      Ankle Exercises: Standing   Other Standing Ankle Exercises NBOS on foam with EO 2x30 sec; NBOS with foam pad 3x30 sec, NBOS with trunk rotation Lt/Rt  x10 reps with and without orange weight ball      Ankle Exercises: Seated   Ankle Circles/Pumps Other (comment)  x2 min clockwise  and counterclockwise    Other Seated Ankle Exercises ankle PF x25 reps, using blue TB                 PT Education - 09/22/16 1539    Education provided Yes   Education Details reviewed weight bearing precautions; technique with therex and implications for manual techniques    Person(s) Educated Patient   Methods Explanation;Verbal cues   Comprehension Verbalized understanding;Returned demonstration          PT Short Term Goals - 08/30/16 1748      PT SHORT TERM GOAL #1   Title Pt will demo consistency and independence with his HEP to improve ankle ROM and strength.    Time 2   Period Weeks   Status New     PT SHORT TERM GOAL #2   Title Pt will demo improved ankle DF PROM to atleast 10 deg, to prepare for ambulation out of the CAM boot.    Time 4   Period Weeks   Status New     PT SHORT TERM GOAL #3   Title Pt will demo understanding of the importance of edema/inflammation management evident by his ability to verbalize ice/elevation parameters.  Time 2   Period Weeks   Status New     PT SHORT TERM GOAL #4   Title Pt will ascend/descend atleast 4, 6" steps with LRAD, 50% weightbearing and without LOB, to improve safety with stair negotiation at home.    Time 2   Period Weeks   Status New           PT Long Term Goals - 08/30/16 1752      PT LONG TERM GOAL #1   Title Pt will demo ankle AROM to atleast 10 deg, to improve foot clearance with ambulation.    Time 8   Period Weeks   Status New     PT LONG TERM GOAL #2   Title Pt will demo improved ankle PF AROM to atleast 45 degrees to allow him to return to golfing activities with his peers.   Time 8   Period Weeks   Status New     PT LONG TERM GOAL #3   Title Pt will perform SLS on each LE for up to 15 sec on a firm surface, to demonstrate improved proprioception during weight bearing activity.    Time 8   Period Weeks   Status New     PT LONG TERM GOAL #4   Title Pt will demo ankle DF and PF  strength that is atleast 4/5 MMT, to allow him to ambulate without difficulty.   Time 8   Period Weeks   Status New               Plan - 09/22/16 1540    Clinical Impression Statement Today's session continued with manual techniques to address ankle hypomobility. Pt arrived without a crutch, demonstrating good steadiness but with excess foot eversion and lean to the Lt. Performed manual techniques to address limitations in ankle mobility in all directions and followed this with standing activity to improve weight shift and ankle proprioception without weight bearing status provided by the surgeon. Pt reporting medial and lateral heel discomfort/soreness, likely secondary to his altered gait mechanics. This remained unchanged during his sessions and he ended the session without increase in pain.    Rehab Potential Good   PT Frequency 2x / week   PT Duration 8 weeks   PT Treatment/Interventions ADLs/Self Care Home Management;Cryotherapy;Therapeutic exercise;Therapeutic activities;Functional mobility training;Stair training;Gait training;Balance training;Neuromuscular re-education;Patient/family education;Orthotic Fit/Training;Manual techniques;Scar mobilization;Passive range of motion;Dry needling;Taping   PT Next Visit Plan wean out of the boot per MD protocol, gait mechanics; continue with manual techniques and exercise to improve ankle ROM   PT Home Exercise Plan ankle PF/DF with blue TB, ankle DF stretch with strap 3x30 sec, ankle PF stretch 5x10 sec, great toe extension stretch 5x10 sec    Consulted and Agree with Plan of Care Patient      Patient will benefit from skilled therapeutic intervention in order to improve the following deficits and impairments:  Abnormal gait, Decreased activity tolerance, Decreased strength, Increased fascial restricitons, Impaired flexibility, Pain, Improper body mechanics, Increased edema, Decreased scar mobility, Decreased range of motion, Decreased  balance, Decreased endurance, Decreased mobility, Decreased skin integrity, Difficulty walking, Hypomobility, Impaired sensation  Visit Diagnosis: Stiffness of right ankle, not elsewhere classified  Pain in right ankle and joints of right foot  Other abnormalities of gait and mobility  Localized edema     Problem List Patient Active Problem List   Diagnosis Date Noted  . OA (osteoarthritis) of ankle 09/26/2012  . Pain in joint, ankle and foot  09/26/2012   3:47 PM,09/22/16 Elly Modena PT, DPT [facility] [place] [address] [place], [place], [address] Phone: [phone]   Fax:  [phone]  Name: NASEEM STEPHAN MRN: [id number] Date of Birth: 02/20/1956

## 2016-09-27 ENCOUNTER — Ambulatory Visit (HOSPITAL_COMMUNITY): Payer: BC Managed Care – PPO | Admitting: Physical Therapy

## 2016-09-27 DIAGNOSIS — M25571 Pain in right ankle and joints of right foot: Secondary | ICD-10-CM

## 2016-09-27 DIAGNOSIS — M25671 Stiffness of right ankle, not elsewhere classified: Secondary | ICD-10-CM | POA: Diagnosis not present

## 2016-09-27 DIAGNOSIS — R2689 Other abnormalities of gait and mobility: Secondary | ICD-10-CM

## 2016-09-27 NOTE — Patient Instructions (Addendum)
PRE: Eversion (Side-Lying)    With _0___ pound weight around right foot, big toe down, bend ankle up and turn foot out. Repeat _10_-15__ times per set. Do 1____ sets per session. Do __2__ sessions per day.  http://orth.exer.us/58   Copyright  VHI. All rights reserved.  PRE: Inversion (Side-Lying)    With __0__ pound weight around left foot, big toe up, bend ankle up and turn foot in. Repeat __10-15__ times per set. Do _1___ sets per session. Do _2___ sessions per day.  http://orth.exer.us/56   Copyright  VHI. All rights reserved.  Toe Raise (Standing)    Rock back on heels. Repeat _10___ times per set. Do _1___ sets per session. Do __2__ sessions per day.  http://orth.exer.us/42   Copyright  VHI. All rights reserved.

## 2016-09-27 NOTE — Therapy (Signed)
[place], [place], [address] Phone: [phone]   Fax:  [phone]  Physical Therapy Treatment  Patient Details  Name: Dillon [name] MRN: [id number] Date of Birth: May 20, 1956 Referring Provider: Lennox Grumbles, MD  Encounter Date: 09/27/2016      PT End of Session - 09/27/16 1517    Visit Number 9   Number of Visits 16   Date for PT Re-Evaluation 09/27/16   Authorization Type BCBS   Authorization Time Period 08/30/16 to 10/25/16   PT Start Time 1437   PT Stop Time 1518   PT Time Calculation (min) 41 min   Equipment Utilized During Treatment --  Rt CAM boot   Activity Tolerance Patient tolerated treatment well;No increased pain   Behavior During Therapy [facility] for tasks assessed/performed      No past medical history on file.  Past Surgical History:  Procedure Laterality Date  . KNEE SURGERY    . WRIST FRACTURE SURGERY      There were no vitals filed for this visit.      Subjective Assessment - 09/27/16 1507    Subjective Pt states most of his pain is on his heel and on the side of his foot.   Pt states that he is suppose to be weaning off the cam boot today.    Pertinent History Rt meniscectomy 7 years ago   Patient Stated Goals improve mobility and get back to golfing   Currently in Pain? No/denies                         [facility] Adult PT Treatment/Exercise - 09/27/16 0001      Ambulation/Gait   Ambulation/Gait Yes   Ambulation/Gait Assistance 5: Supervision   Ambulation Distance (Feet) 226 Feet   Assistive device None     Exercises   Exercises Ankle     Manual Therapy   Manual Therapy Edema management;Passive ROM   Edema Management manual decongestive techniques to decrease swelling    Passive ROM for all motions including contract relax techniques      Ankle Exercises: Stretches   Other Stretch Rt PF stretch with towel 2x5reps, 20 sec hold      Ankle Exercises:  Standing   Heel Raises 5 reps   Toe Raise 10 reps   Other Standing Ankle Exercises tandem stance x 3 x 30 seconds Rt;front then back front    Other Standing Ankle Exercises heel toe gait in //      Ankle Exercises: Sidelying   Ankle Inversion Strengthening;Right;10 reps   Ankle Eversion Right;10 reps     Ambulation   Ambulation/Gait Assistance Details --  pt told to wean out of boot 1 hr off, 4 on                 PT Education - 09/27/16 1517    Education provided Yes   Education Details new HEP   Person(s) Educated Patient   Methods Explanation   Comprehension Verbalized understanding          PT Short Term Goals - 08/30/16 1748      PT SHORT TERM GOAL #1   Title Pt will demo consistency and independence with his HEP to improve ankle ROM and strength.    Time 2   Period Weeks   Status New     PT SHORT TERM GOAL #2   Title Pt will demo improved ankle DF  PROM to atleast 10 deg, to prepare for ambulation out of the CAM boot.    Time 4   Period Weeks   Status New     PT SHORT TERM GOAL #3   Title Pt will demo understanding of the importance of edema/inflammation management evident by his ability to verbalize ice/elevation parameters.    Time 2   Period Weeks   Status New     PT SHORT TERM GOAL #4   Title Pt will ascend/descend atleast 4, 6" steps with LRAD, 50% weightbearing and without LOB, to improve safety with stair negotiation at home.    Time 2   Period Weeks   Status New           PT Long Term Goals - 08/30/16 1752      PT LONG TERM GOAL #1   Title Pt will demo ankle AROM to atleast 10 deg, to improve foot clearance with ambulation.    Time 8   Period Weeks   Status New     PT LONG TERM GOAL #2   Title Pt will demo improved ankle PF AROM to atleast 45 degrees to allow him to return to golfing activities with his peers.   Time 8   Period Weeks   Status New     PT LONG TERM GOAL #3   Title Pt will perform SLS on each LE for up to 15  sec on a firm surface, to demonstrate improved proprioception during weight bearing activity.    Time 8   Period Weeks   Status New     PT LONG TERM GOAL #4   Title Pt will demo ankle DF and PF strength that is atleast 4/5 MMT, to allow him to ambulate without difficulty.   Time 8   Period Weeks   Status New               Plan - 09/27/16 1518    Clinical Impression Statement Manual for swelling, Contract relax for increased ROM.  Pt able to come out of cam boot today worked on heel toe gait with pt with good form.  Pt given weaning instructions.    Rehab Potential Good   PT Frequency 2x / week   PT Duration 8 weeks   PT Treatment/Interventions ADLs/Self Care Home Management;Cryotherapy;Therapeutic exercise;Therapeutic activities;Functional mobility training;Stair training;Gait training;Balance training;Neuromuscular re-education;Patient/family education;Orthotic Fit/Training;Manual techniques;Scar mobilization;Passive range of motion;Dry needling;Taping   PT Next Visit Plan wean out of the boot per MD protocol, gait mechanics; continue with manual techniques and exercise to improve ankle ROM   PT Home Exercise Plan ankle PF/DF with blue TB, ankle DF stretch with strap 3x30 sec, ankle PF stretch 5x10 sec, great toe extension stretch 5x10 sec    Consulted and Agree with Plan of Care Patient      Patient will benefit from skilled therapeutic intervention in order to improve the following deficits and impairments:  Abnormal gait, Decreased activity tolerance, Decreased strength, Increased fascial restricitons, Impaired flexibility, Pain, Improper body mechanics, Increased edema, Decreased scar mobility, Decreased range of motion, Decreased balance, Decreased endurance, Decreased mobility, Decreased skin integrity, Difficulty walking, Hypomobility, Impaired sensation  Visit Diagnosis: Stiffness of right ankle, not elsewhere classified  Pain in right ankle and joints of right  foot  Other abnormalities of gait and mobility     Problem List Patient Active Problem List   Diagnosis Date Noted  . OA (osteoarthritis) of ankle 09/26/2012  . Pain in joint, ankle and foot  09/26/2012  Rayetta Humphrey, PT CLT [phone] 09/27/2016, 3:20 PM  [place] [address] [place], [place], [address] Phone: [phone]   Fax:  [phone]  Name: ZIGGY DICAPUA MRN: [id number] Date of Birth: 03/16/1956

## 2016-09-29 ENCOUNTER — Ambulatory Visit (HOSPITAL_COMMUNITY): Payer: BC Managed Care – PPO

## 2016-09-29 DIAGNOSIS — M25571 Pain in right ankle and joints of right foot: Secondary | ICD-10-CM

## 2016-09-29 DIAGNOSIS — R600 Localized edema: Secondary | ICD-10-CM

## 2016-09-29 DIAGNOSIS — R2689 Other abnormalities of gait and mobility: Secondary | ICD-10-CM

## 2016-09-29 DIAGNOSIS — M25671 Stiffness of right ankle, not elsewhere classified: Secondary | ICD-10-CM | POA: Diagnosis not present

## 2016-09-29 NOTE — Therapy (Signed)
[place], [place], [address] Phone: [phone]   Fax:  [phone]  Physical Therapy Treatment  Patient Details  Name: Dillon [name] MRN: [id number] Date of Birth: 22-May-1956 Referring Provider: Lennox Grumbles, MD  Encounter Date: 09/29/2016      PT End of Session - 09/29/16 1513    Visit Number 10   Number of Visits 16   Date for PT Re-Evaluation 09/27/16   Authorization Type BCBS   Authorization Time Period 08/30/16 to 10/25/16   PT Start Time 1511   PT Stop Time 1558   PT Time Calculation (min) 47 min   Equipment Utilized During Treatment --  Rt CAM boot progress to tennis shoes   Activity Tolerance Patient tolerated treatment well;No increased pain   Behavior During Therapy [facility] for tasks assessed/performed      No past medical history on file.  Past Surgical History:  Procedure Laterality Date  . KNEE SURGERY    . WRIST FRACTURE SURGERY      There were no vitals filed for this visit.      Subjective Assessment - 09/29/16 1505    Subjective Pt stated he is feeling good.  Has began weaning off the cam boot for 2 hours and ambulated ~500 yards in tennis shoes.   Pertinent History Rt meniscectomy 7 years ago   Patient Stated Goals improve mobility and get back to golfing   Currently in Pain? No/denies            [facility] PT Assessment - 09/29/16 0001      Assessment   Medical Diagnosis Rt ankle replacement   Referring Provider Lennox Grumbles, MD   Onset Date/Surgical Date 07/22/16   Next MD Visit 10/10/16   Prior Therapy None      Precautions   Precautions Other (comment)   Precaution Comments ankle replacement protocol     AROM   Right Ankle Dorsiflexion 8  was 6   Right Ankle Plantar Flexion 34  was 32   Right Ankle Inversion 12  was 10   Right Ankle Eversion 9  was 8                     [facility] Adult PT Treatment/Exercise - 09/29/16 0001      Ambulation/Gait   Ambulation/Gait Yes   Ambulation/Gait Assistance 5: Supervision   Ambulation Distance (Feet) 226 Feet   Assistive device Straight cane;None  to improve stance phase with gait   Gait Comments Minimal cueing and good presentation with heel to toe, increase Lt LE stance phase to improve stance phase Rt; utilized SPC to improve stability with gait     Manual Therapy   Manual Therapy Edema management;Passive ROM   Manual therapy comments separate rest of the session    Edema Management Manual retro massage with LE elevated   Joint Mobilization Grade III/IV AP and PA talocrural jt mobs, Grade III/IV medial and lateral subtalar joint mobs   Passive ROM for all motions including contract relax techniques      Ankle Exercises: Seated   BAPS Sitting;Other (comment)  DF/PF, CW/CCW   Other Seated Ankle Exercises Inversion with leg crossed 15x 5"      Ankle Exercises: Stretches   Gastroc Stretch 3 reps;30 seconds   Gastroc Stretch Limitations against wall with HEP printout      Ankle Exercises: Standing   Rocker Board 2 minutes  to improve Inv/Ev and stance phase  with gait   Heel Raises 10 reps   Toe Raise 10 reps   Other Standing Ankle Exercises tandem stance 2x 30 first stable then on airex                  PT Short Term Goals - 08/30/16 1748      PT SHORT TERM GOAL #1   Title Pt will demo consistency and independence with his HEP to improve ankle ROM and strength.    Time 2   Period Weeks   Status New     PT SHORT TERM GOAL #2   Title Pt will demo improved ankle DF PROM to atleast 10 deg, to prepare for ambulation out of the CAM boot.    Time 4   Period Weeks   Status New     PT SHORT TERM GOAL #3   Title Pt will demo understanding of the importance of edema/inflammation management evident by his ability to verbalize ice/elevation parameters.    Time 2   Period Weeks   Status New     PT SHORT TERM GOAL #4   Title Pt will ascend/descend atleast 4, 6" steps with  LRAD, 50% weightbearing and without LOB, to improve safety with stair negotiation at home.    Time 2   Period Weeks   Status New           PT Long Term Goals - 08/30/16 1752      PT LONG TERM GOAL #1   Title Pt will demo ankle AROM to atleast 10 deg, to improve foot clearance with ambulation.    Time 8   Period Weeks   Status New     PT LONG TERM GOAL #2   Title Pt will demo improved ankle PF AROM to atleast 45 degrees to allow him to return to golfing activities with his peers.   Time 8   Period Weeks   Status New     PT LONG TERM GOAL #3   Title Pt will perform SLS on each LE for up to 15 sec on a firm surface, to demonstrate improved proprioception during weight bearing activity.    Time 8   Period Weeks   Status New     PT LONG TERM GOAL #4   Title Pt will demo ankle DF and PF strength that is atleast 4/5 MMT, to allow him to ambulate without difficulty.   Time 8   Period Weeks   Status New               Plan - 09/29/16 1601    Clinical Impression Statement Continued session focus with ROM and gait mechanics weaning out of boot.  Pt continues to have edema present limiting range.  Manual retro massage techniques for edema control followed by ROM based therex.  Added rocker board to improve weight distribution with gait as well as inversion/eversion range.  Pt making slow gains wiht improved ROM, measurements takens at EOS.     Rehab Potential Good   PT Frequency 2x / week   PT Duration 8 weeks   PT Treatment/Interventions ADLs/Self Care Home Management;Cryotherapy;Therapeutic exercise;Therapeutic activities;Functional mobility training;Stair training;Gait training;Balance training;Neuromuscular re-education;Patient/family education;Orthotic Fit/Training;Manual techniques;Scar mobilization;Passive range of motion;Dry needling;Taping   PT Next Visit Plan Reassess next session.  wean out of the boot per MD protocol, gait mechanics; continue with manual techniques  and exercise to improve ankle ROM   PT Home Exercise Plan ankle PF/DF with blue TB, ankle DF  stretch with strap 3x30 sec, ankle PF stretch 5x10 sec, great toe extension stretch 5x10 sec ; 2/22: standing gastroc stretch      Patient will benefit from skilled therapeutic intervention in order to improve the following deficits and impairments:  Abnormal gait, Decreased activity tolerance, Decreased strength, Increased fascial restricitons, Impaired flexibility, Pain, Improper body mechanics, Increased edema, Decreased scar mobility, Decreased range of motion, Decreased balance, Decreased endurance, Decreased mobility, Decreased skin integrity, Difficulty walking, Hypomobility, Impaired sensation  Visit Diagnosis: Stiffness of right ankle, not elsewhere classified  Pain in right ankle and joints of right foot  Other abnormalities of gait and mobility  Localized edema     Problem List Patient Active Problem List   Diagnosis Date Noted  . OA (osteoarthritis) of ankle 09/26/2012  . Pain in joint, ankle and foot 09/26/2012   Ihor Austin, LPTA; CBIS [phone]  Aldona Lento 09/29/2016, 4:19 PM  [place], [place], [address] Phone: [phone]   Fax:  [phone]  Name: EMEAL MAILHOT MRN: [id number] Date of Birth: 01-Aug-1956

## 2016-09-29 NOTE — Patient Instructions (Signed)
Achilles / Gastroc, Standing    Stand, right foot behind, heel on floor and turned slightly out, leg straight, forward leg bent. Move hips forward. Hold 30 seconds. Repeat 3  times per session. Do 2 sessions per day.  Copyright  VHI. All rights reserved.   

## 2016-10-04 ENCOUNTER — Ambulatory Visit (HOSPITAL_COMMUNITY): Payer: BC Managed Care – PPO | Admitting: Physical Therapy

## 2016-10-04 DIAGNOSIS — R2689 Other abnormalities of gait and mobility: Secondary | ICD-10-CM

## 2016-10-04 DIAGNOSIS — M25671 Stiffness of right ankle, not elsewhere classified: Secondary | ICD-10-CM | POA: Diagnosis not present

## 2016-10-04 DIAGNOSIS — M25571 Pain in right ankle and joints of right foot: Secondary | ICD-10-CM

## 2016-10-04 DIAGNOSIS — R600 Localized edema: Secondary | ICD-10-CM

## 2016-10-04 NOTE — Therapy (Signed)
[place], [place], [address] Phone: [phone]   Fax:  [phone]  Physical Therapy Treatment  Patient Details  Name: Dillon [name] MRN: [id number] Date of Birth: [birth year] Referring Provider: Lennox Grumbles, MD  Encounter Date: 10/04/2016      PT End of Session - 10/04/16 1713    Visit Number 11   Number of Visits 16   Date for PT Re-Evaluation 09/27/16   Authorization Type BCBS   Authorization Time Period 08/30/16 to 10/25/16   PT Start Time 1435   PT Stop Time 1514   PT Time Calculation (min) 39 min   Equipment Utilized During Treatment --  arrived in Rt CAM boot; tennis shoe worn during session   Activity Tolerance Patient tolerated treatment well;No increased pain   Behavior During Therapy [facility] for tasks assessed/performed      No past medical history on file.  Past Surgical History:  Procedure Laterality Date  . KNEE SURGERY    . WRIST FRACTURE SURGERY      There were no vitals filed for this visit.      Subjective Assessment - 10/04/16 1710    Subjective Pt reports things are going well. He has been gradually weaning out of his boot and feels like his ankle is feeling good. He continues to work on his HEP.   Pertinent History Rt meniscectomy 7 years ago   Patient Stated Goals improve mobility and get back to golfing   Currently in Pain? No/denies                         [facility] Adult PT Treatment/Exercise - 10/04/16 0001      Ambulation/Gait   Gait Comments Pt ambulating x24ft without AD, wearing tennis shoe, therapist encouraging improved weight shift to the Rt and narrowed BOS     Ankle Exercises: Stretches   Other Stretch Rt gastroc stretch on slant board   16 deg DF   Other Stretch PF stretch on slantboard with over pressure 5x20 sec on the Rt      Ankle Exercises: Standing   SLS on Rt, LLE propped 3x30 sec; RLE stance with Lt hip flexion/march x10 reps    Other Standing Ankle Exercises heel raises with weight shift Lt  x20 reps; Standing DF on decline with BLE x20 reps   Other Standing Ankle Exercises retrorocking with LLE forward in // bars x2 min                PT Education - 10/04/16 1711    Education provided Yes   Education Details encouraged pt to bring compression garments to his next appointment to address the swelling   Person(s) Educated Patient   Methods Explanation   Comprehension Verbalized understanding          PT Short Term Goals - 08/30/16 1748      PT SHORT TERM GOAL #1   Title Pt will demo consistency and independence with his HEP to improve ankle ROM and strength.    Time 2   Period Weeks   Status New     PT SHORT TERM GOAL #2   Title Pt will demo improved ankle DF PROM to atleast 10 deg, to prepare for ambulation out of the CAM boot.    Time 4   Period Weeks   Status New     PT SHORT TERM GOAL #3   Title Pt will demo  understanding of the importance of edema/inflammation management evident by his ability to verbalize ice/elevation parameters.    Time 2   Period Weeks   Status New     PT SHORT TERM GOAL #4   Title Pt will ascend/descend atleast 4, 6" steps with LRAD, 50% weightbearing and without LOB, to improve safety with stair negotiation at home.    Time 2   Period Weeks   Status New           PT Long Term Goals - 08/30/16 1752      PT LONG TERM GOAL #1   Title Pt will demo ankle AROM to atleast 10 deg, to improve foot clearance with ambulation.    Time 8   Period Weeks   Status New     PT LONG TERM GOAL #2   Title Pt will demo improved ankle PF AROM to atleast 45 degrees to allow him to return to golfing activities with his peers.   Time 8   Period Weeks   Status New     PT LONG TERM GOAL #3   Title Pt will perform SLS on each LE for up to 15 sec on a firm surface, to demonstrate improved proprioception during weight bearing activity.    Time 8   Period Weeks   Status New      PT LONG TERM GOAL #4   Title Pt will demo ankle DF and PF strength that is atleast 4/5 MMT, to allow him to ambulate without difficulty.   Time 8   Period Weeks   Status New               Plan - 10/04/16 1714    Clinical Impression Statement Continued this session with exercise to improve ankle strength, ROM and proprioception. Pt demonstrating improvements in ankle DF ROM following exercises up to ~15 deg. Also addressed balance and gait mechanics, noting poor Rt ankle proprioception during SLS as he was unable to hold for more than 1-2 sec at a time. Although expected after his surgery, the pt continues to have increased swelling and has been unable to find proper compression to address this. He was encouraged to bring his items to his next appointment for further instruction and direction from the therapist. He verbalized understanding.    Rehab Potential Good   PT Frequency 2x / week   PT Duration 8 weeks   PT Treatment/Interventions ADLs/Self Care Home Management;Cryotherapy;Therapeutic exercise;Therapeutic activities;Functional mobility training;Stair training;Gait training;Balance training;Neuromuscular re-education;Patient/family education;Orthotic Fit/Training;Manual techniques;Scar mobilization;Passive range of motion;Dry needling;Taping   PT Next Visit Plan Assess ankle compression; wean out of the boot per MD protocol, single leg balance, trial MWM to improve ankle DF    PT Home Exercise Plan ankle PF/DF with blue TB, ankle DF stretch with strap 3x30 sec, ankle PF stretch 5x10 sec, great toe extension stretch 5x10 sec ; 2/22: standing gastroc stretch   Consulted and Agree with Plan of Care Patient      Patient will benefit from skilled therapeutic intervention in order to improve the following deficits and impairments:  Abnormal gait, Decreased activity tolerance, Decreased strength, Increased fascial restricitons, Impaired flexibility, Pain, Improper body mechanics,  Increased edema, Decreased scar mobility, Decreased range of motion, Decreased balance, Decreased endurance, Decreased mobility, Decreased skin integrity, Difficulty walking, Hypomobility, Impaired sensation  Visit Diagnosis: Stiffness of right ankle, not elsewhere classified  Pain in right ankle and joints of right foot  Other abnormalities of gait and mobility  Localized edema  Problem List Patient Active Problem List   Diagnosis Date Noted  . OA (osteoarthritis) of ankle 09/26/2012  . Pain in joint, ankle and foot 09/26/2012    5:25 PM,10/04/16 Elly Modena PT, DPT [facility] Outpatient Physical Therapy [place] [address] [place], [place], [address] Phone: [phone]   Fax:  [phone]  Name: OKOYE HAGUE MRN: [id number] Date of Birth: 1955/10/20

## 2016-10-06 ENCOUNTER — Ambulatory Visit (HOSPITAL_COMMUNITY): Payer: BC Managed Care – PPO | Attending: Orthopedic Surgery | Admitting: Physical Therapy

## 2016-10-06 DIAGNOSIS — M25571 Pain in right ankle and joints of right foot: Secondary | ICD-10-CM | POA: Diagnosis present

## 2016-10-06 DIAGNOSIS — M25671 Stiffness of right ankle, not elsewhere classified: Secondary | ICD-10-CM | POA: Diagnosis not present

## 2016-10-06 DIAGNOSIS — R2689 Other abnormalities of gait and mobility: Secondary | ICD-10-CM | POA: Insufficient documentation

## 2016-10-06 DIAGNOSIS — R600 Localized edema: Secondary | ICD-10-CM | POA: Insufficient documentation

## 2016-10-06 NOTE — Therapy (Signed)
[place], [place], [address] Phone: [phone]   Fax:  [phone]  Physical Therapy Treatment  Patient Details  Name: Dillon [name] MRN: [id number] Date of Birth: 1955/11/22 Referring Provider: Lennox Grumbles, MD  Encounter Date: 10/06/2016      PT End of Session - 10/06/16 1521    Visit Number 12   Number of Visits 16   Date for PT Re-Evaluation 10/25/16   Authorization Type BCBS   Authorization Time Period 08/30/16 to 10/25/16   PT Start Time 1430   PT Stop Time 1515   PT Time Calculation (min) 45 min   Equipment Utilized During Treatment --  Arrived wearing tennis shoe to session, using 1 crutch    Activity Tolerance Patient tolerated treatment well;No increased pain   Behavior During Therapy [facility] for tasks assessed/performed      No past medical history on file.  Past Surgical History:  Procedure Laterality Date  . KNEE SURGERY    . WRIST FRACTURE SURGERY      There were no vitals filed for this visit.                       [facility] Adult PT Treatment/Exercise - 10/06/16 0001      Manual Therapy   Manual Therapy Joint mobilization   Manual therapy comments separate rest of the session    Joint Mobilization Rt closed chain DF MWM on 12" step, Rt PF stretch in quadruped with anterior talocrural glide and over pressure 10x10 sec, Grade III/IV Rt medial subtalar glide      Ankle Exercises: Standing   SLS RLE on foam, Lt toe touch, 4x30 sec     Ankle Exercises: Seated   Other Seated Ankle Exercises Rt ankle DF with blue TB x25 reps; Rt ankle PF with blue TB x25 reps    Other Seated Ankle Exercises Rt gross toe abduction 15x2 sec hold; 1st MTP flexion isometric 15x3 sec hold                 PT Education - 10/06/16 1520    Education provided Yes   Education Details discussed proper wear of compression garments; noted ROM by the end of the session   Person(s) Educated  Patient   Methods Explanation;Demonstration   Comprehension Verbalized understanding          PT Short Term Goals - 08/30/16 1748      PT SHORT TERM GOAL #1   Title Pt will demo consistency and independence with his HEP to improve ankle ROM and strength.    Time 2   Period Weeks   Status New     PT SHORT TERM GOAL #2   Title Pt will demo improved ankle DF PROM to atleast 10 deg, to prepare for ambulation out of the CAM boot.    Time 4   Period Weeks   Status New     PT SHORT TERM GOAL #3   Title Pt will demo understanding of the importance of edema/inflammation management evident by his ability to verbalize ice/elevation parameters.    Time 2   Period Weeks   Status New     PT SHORT TERM GOAL #4   Title Pt will ascend/descend atleast 4, 6" steps with LRAD, 50% weightbearing and without LOB, to improve safety with stair negotiation at home.    Time 2   Period Weeks   Status New  PT Long Term Goals - 08/30/16 1752      PT LONG TERM GOAL #1   Title Pt will demo ankle AROM to atleast 10 deg, to improve foot clearance with ambulation.    Time 8   Period Weeks   Status New     PT LONG TERM GOAL #2   Title Pt will demo improved ankle PF AROM to atleast 45 degrees to allow him to return to golfing activities with his peers.   Time 8   Period Weeks   Status New     PT LONG TERM GOAL #3   Title Pt will perform SLS on each LE for up to 15 sec on a firm surface, to demonstrate improved proprioception during weight bearing activity.    Time 8   Period Weeks   Status New     PT LONG TERM GOAL #4   Title Pt will demo ankle DF and PF strength that is atleast 4/5 MMT, to allow him to ambulate without difficulty.   Time 8   Period Weeks   Status New               Plan - 10/06/16 1521    Clinical Impression Statement Pt arrived today wearing his compression garments on the RLE. He is now wearing his tennis shoe full time and reports no significant  pain, only discomfort along the lateral midfoot. Overall, he is making progress towards all goals with improved ankle ROM, strength, mobility and proprioception. Session focused heavily on manual techniques and mobilizations to improve ankle DF/PF for carry over with ambulation. Ended session with Rt ankle ROM at 35 deg PF and ~8 deg DF. He continues to demonstrate unsteadiness with Rt single leg stance, requiring assistance to maintain his balance and we will continue to address this in future sessions for improved safety at home and in the community without an AD. Pt encouraged to continue with HEP and introduce resistance bands to improve ankle strength. Verbalized understanding and reporting no increase in pain following the session.    Rehab Potential Good   PT Frequency 2x / week   PT Duration 8 weeks   PT Treatment/Interventions ADLs/Self Care Home Management;Cryotherapy;Therapeutic exercise;Therapeutic activities;Functional mobility training;Stair training;Gait training;Balance training;Neuromuscular re-education;Patient/family education;Orthotic Fit/Training;Manual techniques;Scar mobilization;Passive range of motion;Dry needling;Taping   PT Next Visit Plan Manual techniques to address ankle ROM limitations, gait mechanics to improve weight acceptance on RLE, single leg balance, Ankle PF/DF strength   PT Home Exercise Plan ankle PF/DF with blue TB, ankle DF stretch with strap 3x30 sec, ankle PF stretch 5x10 sec, great toe extension stretch 5x10 sec ; 2/22: standing gastroc stretch   Consulted and Agree with Plan of Care Patient      Patient will benefit from skilled therapeutic intervention in order to improve the following deficits and impairments:  Abnormal gait, Decreased activity tolerance, Decreased strength, Increased fascial restricitons, Impaired flexibility, Pain, Improper body mechanics, Increased edema, Decreased scar mobility, Decreased range of motion, Decreased balance, Decreased  endurance, Decreased mobility, Decreased skin integrity, Difficulty walking, Hypomobility, Impaired sensation  Visit Diagnosis: Stiffness of right ankle, not elsewhere classified  Pain in right ankle and joints of right foot  Other abnormalities of gait and mobility  Localized edema     Problem List Patient Active Problem List   Diagnosis Date Noted  . OA (osteoarthritis) of ankle 09/26/2012  . Pain in joint, ankle and foot 09/26/2012   3:41 PM,10/06/16 Elly Modena PT, DPT [facility] [place] [address] [place], [place], [address] Phone: [phone]   Fax:  [phone]  Name: Dillon [name] MRN: [id number] Date of Birth: Feb 10, 1956

## 2016-10-11 ENCOUNTER — Encounter (HOSPITAL_COMMUNITY): Payer: BC Managed Care – PPO | Admitting: Physical Therapy

## 2016-10-13 ENCOUNTER — Ambulatory Visit (HOSPITAL_COMMUNITY): Payer: BC Managed Care – PPO

## 2016-10-13 DIAGNOSIS — M25671 Stiffness of right ankle, not elsewhere classified: Secondary | ICD-10-CM | POA: Diagnosis not present

## 2016-10-13 DIAGNOSIS — R2689 Other abnormalities of gait and mobility: Secondary | ICD-10-CM

## 2016-10-13 DIAGNOSIS — R600 Localized edema: Secondary | ICD-10-CM

## 2016-10-13 DIAGNOSIS — M25571 Pain in right ankle and joints of right foot: Secondary | ICD-10-CM

## 2016-10-13 NOTE — Therapy (Signed)
[facility] [facility] [place], [place], [address] Phone: [phone]   Fax:  [phone]  Physical Therapy Treatment  Patient Details  Name: Dillon [name] MRN: [id number] Date of Birth: May 16, 1956 Referring Provider: Lennox Grumbles, MD  Encounter Date: 10/13/2016      PT End of Session - 10/13/16 2033    Visit Number 13   Number of Visits 16   Date for PT Re-Evaluation 10/25/16   Authorization Type BCBS   Authorization Time Period 08/30/16 to 10/25/16   PT Start Time 1436   PT Stop Time 1518   PT Time Calculation (min) 42 min   Activity Tolerance Patient tolerated treatment well;No increased pain   Behavior During Therapy [facility] for tasks assessed/performed      No past medical history on file.  Past Surgical History:  Procedure Laterality Date  . KNEE SURGERY    . WRIST FRACTURE SURGERY      There were no vitals filed for this visit.      Subjective Assessment - 10/13/16 1442    Subjective Pt reports ankle is feeling ok, mild pain, says he has some continued pain but still making progress overall. He continues to walk daily.    Pertinent History Rt meniscectomy 7 years ago   Currently in Pain? No/denies  doe snot rate. Mild pain across dorsum of ankle.                          [facility] Adult PT Treatment/Exercise - 10/13/16 0001      Ankle Exercises: Stretches   Other Stretch Seated heel slides in sagittal plane 2" Rt; rotational foot slides 2" Rt   Other Stretch Door push calf stretch Rt: adducted RLE c mild toe-in: 5x30s  ROM remains more limited by capsular swelling than by      Ankle Exercises: Standing   SLS Bilat SLS on firm surface 5x20sec   Other Standing Ankle Exercises Lateral Toe walk, Heel walk; Retro Toe Walk: 2x51ft  Standing NBOS trunk rotation against band    Other Standing Ankle Exercises Bilat Lateral step-up 4' wide step: 2x15      Ankle Exercises: Seated   Towel  Inversion/Eversion --  *see above             Balance Exercises - 10/13/16 2032      Balance Exercises: Standing   Tandem Stance 5 reps;30 secs  Rt only: ball rebound, dribbling, self toss, etc.              PT Short Term Goals - 08/30/16 1748      PT SHORT TERM GOAL #1   Title Pt will demo consistency and independence with his HEP to improve ankle ROM and strength.    Time 2   Period Weeks   Status New     PT SHORT TERM GOAL #2   Title Pt will demo improved ankle DF PROM to atleast 10 deg, to prepare for ambulation out of the CAM boot.    Time 4   Period Weeks   Status New     PT SHORT TERM GOAL #3   Title Pt will demo understanding of the importance of edema/inflammation management evident by his ability to verbalize ice/elevation parameters.    Time 2   Period Weeks   Status New     PT SHORT TERM GOAL #4   Title Pt will ascend/descend atleast 4, 6" steps with  LRAD, 50% weightbearing and without LOB, to improve safety with stair negotiation at home.    Time 2   Period Weeks   Status New           PT Long Term Goals - 08/30/16 1752      PT LONG TERM GOAL #1   Title Pt will demo ankle AROM to atleast 10 deg, to improve foot clearance with ambulation.    Time 8   Period Weeks   Status New     PT LONG TERM GOAL #2   Title Pt will demo improved ankle PF AROM to atleast 45 degrees to allow him to return to golfing activities with his peers.   Time 8   Period Weeks   Status New     PT LONG TERM GOAL #3   Title Pt will perform SLS on each LE for up to 15 sec on a firm surface, to demonstrate improved proprioception during weight bearing activity.    Time 8   Period Weeks   Status New     PT LONG TERM GOAL #4   Title Pt will demo ankle DF and PF strength that is atleast 4/5 MMT, to allow him to ambulate without difficulty.   Time 8   Period Weeks   Status New               Plan - 10/13/16 2034    Clinical Impression Statement Pt  tolerating session well overall. Transitioned patient from TBand ankle strength to CKC ankle strengthing with lateral load bearing/stepping. Balance is also progressed to higher volume as well as multi tasking. SLS remains heavily limited by poor gross motor control adn limited proprioception. The patient remains focused on performance of each task as a whole but requires min verbal cues to focus on quality of movement and perform correctly as decribed.He deomnstrates decline in performance toward end of session, but when asked to describe whether related to pain, patigue or other, he does not wish to discuss. Making some progress toward goals overall. Pt expresses desire to better know when he might be DC from PT and this Pryor Curia described the need to focus on goals achievement and restoration of symmetrical ankle funciton.    Rehab Potential Good   PT Frequency 2x / week   PT Duration 8 weeks   PT Treatment/Interventions ADLs/Self Care Home Management;Cryotherapy;Therapeutic exercise;Therapeutic activities;Functional mobility training;Stair training;Gait training;Balance training;Neuromuscular re-education;Patient/family education;Orthotic Fit/Training;Manual techniques;Scar mobilization;Passive range of motion;Dry needling;Taping   PT Next Visit Plan Manual techniques to address ankle ROM limitations, gait mechanics to improve weight acceptance on RLE, single leg balance, Ankle PF/DF strength   PT Home Exercise Plan ankle PF/DF with blue TB, ankle DF stretch with strap 3x30 sec, ankle PF stretch 5x10 sec, great toe extension stretch 5x10 sec ; 2/22: standing gastroc stretch   Consulted and Agree with Plan of Care Patient      Patient will benefit from skilled therapeutic intervention in order to improve the following deficits and impairments:  Abnormal gait, Decreased activity tolerance, Decreased strength, Increased fascial restricitons, Impaired flexibility, Pain, Improper body mechanics, Increased  edema, Decreased scar mobility, Decreased range of motion, Decreased balance, Decreased endurance, Decreased mobility, Decreased skin integrity, Difficulty walking, Hypomobility, Impaired sensation  Visit Diagnosis: Stiffness of right ankle, not elsewhere classified  Pain in right ankle and joints of right foot  Other abnormalities of gait and mobility  Localized edema     Problem List Patient Active Problem List  Diagnosis Date Noted  . OA (osteoarthritis) of ankle 09/26/2012  . Pain in joint, ankle and foot 09/26/2012    8:39 PM, 10/13/16 Etta Grandchild, PT, DPT Physical Therapist - [place] [phone] (mobile)   [place], [place], [address] Phone: [phone]   Fax:  [phone]  Name: Dillon [name] MRN: [id number] Date of Birth: September 12, 1955

## 2016-10-18 ENCOUNTER — Ambulatory Visit (HOSPITAL_COMMUNITY): Payer: BC Managed Care – PPO

## 2016-10-18 DIAGNOSIS — M25671 Stiffness of right ankle, not elsewhere classified: Secondary | ICD-10-CM | POA: Diagnosis not present

## 2016-10-18 DIAGNOSIS — R600 Localized edema: Secondary | ICD-10-CM

## 2016-10-18 DIAGNOSIS — R2689 Other abnormalities of gait and mobility: Secondary | ICD-10-CM

## 2016-10-18 DIAGNOSIS — M25571 Pain in right ankle and joints of right foot: Secondary | ICD-10-CM

## 2016-10-18 NOTE — Therapy (Addendum)
[place], [place], [address] Phone: [phone]   Fax:  [phone]  Physical Therapy Treatment  Patient Details  Name: Dillon [name] MRN: [id number] Date of Birth: [birth year] Referring Provider: Lennox Grumbles, MD  Encounter Date: 10/18/2016      PT End of Session - 10/18/16 1439    Visit Number 14   Number of Visits 16   Date for PT Re-Evaluation 10/25/16   Authorization Type BCBS   Authorization Time Period 08/30/16 to 10/25/16   PT Start Time 1435   PT Stop Time 1514   PT Time Calculation (min) 39 min   Activity Tolerance Patient tolerated treatment well;No increased pain   Behavior During Therapy [facility] for tasks assessed/performed      No past medical history on file.  Past Surgical History:  Procedure Laterality Date  . KNEE SURGERY    . WRIST FRACTURE SURGERY      There were no vitals filed for this visit.      Subjective Assessment - 10/18/16 1436    Subjective Pt stated he was down for a couple days following last session, current pain scale 3-4/10 on lateral aspect of Rt foot.     Pertinent History Rt meniscectomy 7 years ago   Patient Stated Goals improve mobility and get back to golfing   Currently in Pain? Yes   Pain Score 4    Pain Location Foot   Pain Orientation Right   Pain Descriptors / Indicators Discomfort   Pain Type Chronic pain   Pain Radiating Towards none   Pain Onset More than a month ago   Pain Frequency Intermittent   Aggravating Factors  unsure   Pain Relieving Factors sleep, ice elevation   Effect of Pain on Daily Activities minimal impact                         [facility] Adult PT Treatment/Exercise - 10/18/16 0001      Manual Therapy   Manual Therapy Joint mobilization   Manual therapy comments separate rest of the session    Joint Mobilization Rt closed chain DF MWM on 12" step, Rt PF stretch in quadruped with anterior talocrural glide and over  pressure 10x10 sec, Grade III/IV Rt medial subtalar glide      Ankle Exercises: Stretches   Gastroc Stretch 3 reps;30 seconds   Gastroc Stretch Limitations against wall      Ankle Exercises: Standing   SLS Bilat SLS on firm surface 5x20sec   Rocker Board 2 minutes  A/P and R/L with minimal HHA   Heel Raises 10 reps   Toe Raise 10 reps   Balance Beam NBOS on foam with rotation for balance and to simulate golf swing   Other Standing Ankle Exercises Theraband rotation to simulate golf swing   Other Standing Ankle Exercises Bilat Lateral step-up 4' wide step: 2x15              Balance Exercises - 10/18/16 1512      Balance Exercises: Standing   Tandem Stance Foam/compliant surface;3 reps  Tandem stance with tband rotation   [facility] Rotation Limitations Tandem stance with tband rotation both directions     A: Session focus on CKC for ankle strengthening, balance and mobility to improve gait mechanics. Pt progressing well towards goals with reoprts of compliance with HEP and has began walking on treadmill today to address gait mechanisc. Pt is improving  mechanics with heel to toe and minimal cueing to equalize stance phase to reduce "limping". This session incorporated dynamic balance activties with rotation to simulate return to playing golf with therapist facilitation to improve mechanics. No reports of increased pain through session.  [email or web address],[email or web address] [id number]      P: Next session dynamic gait mechanics with RTS (Golf). Manual techniques to address ankle ROM limitations, gait mechanics to improve weight acceptance on RLE, single leg balance, Ankle PF/DF strength       PT Short Term Goals - 08/30/16 1748      PT SHORT TERM GOAL #1   Title Pt will demo consistency and independence with his HEP to improve ankle ROM and strength.    Time 2   Period Weeks   Status New     PT SHORT TERM GOAL #2   Title Pt will demo improved ankle DF PROM to atleast 10 deg, to  prepare for ambulation out of the CAM boot.    Time 4   Period Weeks   Status New     PT SHORT TERM GOAL #3   Title Pt will demo understanding of the importance of edema/inflammation management evident by his ability to verbalize ice/elevation parameters.    Time 2   Period Weeks   Status New     PT SHORT TERM GOAL #4   Title Pt will ascend/descend atleast 4, 6" steps with LRAD, 50% weightbearing and without LOB, to improve safety with stair negotiation at home.    Time 2   Period Weeks   Status New           PT Long Term Goals - 08/30/16 1752      PT LONG TERM GOAL #1   Title Pt will demo ankle AROM to atleast 10 deg, to improve foot clearance with ambulation.    Time 8   Period Weeks   Status New     PT LONG TERM GOAL #2   Title Pt will demo improved ankle PF AROM to atleast 45 degrees to allow him to return to golfing activities with his peers.   Time 8   Period Weeks   Status New     PT LONG TERM GOAL #3   Title Pt will perform SLS on each LE for up to 15 sec on a firm surface, to demonstrate improved proprioception during weight bearing activity.    Time 8   Period Weeks   Status New     PT LONG TERM GOAL #4   Title Pt will demo ankle DF and PF strength that is atleast 4/5 MMT, to allow him to ambulate without difficulty.   Time 8   Period Weeks   Status New               Plan - 10/18/16 1754    Clinical Impression Statement --      Patient will benefit from skilled therapeutic intervention in order to improve the following deficits and impairments:  Abnormal gait, Decreased activity tolerance, Decreased strength, Increased fascial restricitons, Impaired flexibility, Pain, Improper body mechanics, Increased edema, Decreased scar mobility, Decreased range of motion, Decreased balance, Decreased endurance, Decreased mobility, Decreased skin integrity, Difficulty walking, Hypomobility, Impaired sensation  Visit Diagnosis: Stiffness of right ankle,  not elsewhere classified  Pain in right ankle and joints of right foot  Other abnormalities of gait and mobility  Localized edema     Problem List Patient Active Problem List   Diagnosis Date Noted  .  OA (osteoarthritis) of ankle 09/26/2012  . Pain in joint, ankle and foot 09/26/2012   Ihor Austin, LPTA; CBIS [phone]  Aldona Lento 10/18/2016, 6:03 PM  [place], [place], [address] Phone: [phone]   Fax:  [phone]  Name: Dillon [name] MRN: [id number] Date of Birth: Dec 19, 1955

## 2016-10-20 ENCOUNTER — Ambulatory Visit (HOSPITAL_COMMUNITY): Payer: BC Managed Care – PPO

## 2016-10-20 DIAGNOSIS — M25571 Pain in right ankle and joints of right foot: Secondary | ICD-10-CM

## 2016-10-20 DIAGNOSIS — M25671 Stiffness of right ankle, not elsewhere classified: Secondary | ICD-10-CM | POA: Diagnosis not present

## 2016-10-20 DIAGNOSIS — R2689 Other abnormalities of gait and mobility: Secondary | ICD-10-CM

## 2016-10-20 DIAGNOSIS — R600 Localized edema: Secondary | ICD-10-CM

## 2016-10-20 NOTE — Therapy (Signed)
[place], [place], [address] Phone: [phone]   Fax:  [phone]  Physical Therapy Treatment  Patient Details  Name: Dillon [name] MRN: [id number] Date of Birth: 1956/06/24 Referring Provider: Lennox Grumbles, MD  Encounter Date: 10/20/2016      PT End of Session - 10/20/16 1436    Visit Number 15   Number of Visits 16   Date for PT Re-Evaluation 10/25/16   Authorization Type BCBS   Authorization Time Period 08/30/16 to 10/25/16   PT Start Time 1434   PT Stop Time 1512   PT Time Calculation (min) 38 min   Activity Tolerance Patient tolerated treatment well;No increased pain   Behavior During Therapy [facility] for tasks assessed/performed      No past medical history on file.  Past Surgical History:  Procedure Laterality Date  . KNEE SURGERY    . WRIST FRACTURE SURGERY      There were no vitals filed for this visit.      Subjective Assessment - 10/20/16 1435    Subjective Pt stated he is feeling good today, pain minimal maybe 2/10 tpoday.     Pertinent History Rt meniscectomy 7 years ago   Patient Stated Goals improve mobility and get back to golfing   Currently in Pain? Yes   Pain Score 2    Pain Location Foot   Pain Orientation Right;Lateral   Pain Descriptors / Indicators Discomfort   Pain Type Chronic pain   Pain Radiating Towards none   Pain Onset More than a month ago   Pain Frequency Intermittent   Aggravating Factors  unsure   Pain Relieving Factors sleep, ice elevation   Effect of Pain on Daily Activities minimal impact             [facility] Adult PT Treatment/Exercise - 10/20/16 0001      Ankle Exercises: Standing   Heel Raises 15 reps   Toe Raise 15 reps   Toe Walk (Round Trip) attempted; limited range unable    Balance Beam NBOS on foam with rotation for balance and to simulate golf swing   Other Standing Ankle Exercises Theraband rotation to simulate golf swing             Balance Exercises - 10/20/16 1443      Balance Exercises: Standing   Tandem Stance Foam/compliant surface;Eyes open  Tandem stance on foam with rotation yellow ball   Other Standing Exercises Golf swing 20x with SPC to simulate     vector stance 5x5"        PT Short Term Goals - 08/30/16 1748      PT SHORT TERM GOAL #1   Title Pt will demo consistency and independence with his HEP to improve ankle ROM and strength.    Time 2   Period Weeks   Status New     PT SHORT TERM GOAL #2   Title Pt will demo improved ankle DF PROM to atleast 10 deg, to prepare for ambulation out of the CAM boot.    Time 4   Period Weeks   Status New     PT SHORT TERM GOAL #3   Title Pt will demo understanding of the importance of edema/inflammation management evident by his ability to verbalize ice/elevation parameters.    Time 2   Period Weeks   Status New     PT SHORT TERM GOAL #4   Title Pt will ascend/descend atleast 4,  6" steps with LRAD, 50% weightbearing and without LOB, to improve safety with stair negotiation at home.    Time 2   Period Weeks   Status New           PT Long Term Goals - 08/30/16 1752      PT LONG TERM GOAL #1   Title Pt will demo ankle AROM to atleast 10 deg, to improve foot clearance with ambulation.    Time 8   Period Weeks   Status New     PT LONG TERM GOAL #2   Title Pt will demo improved ankle PF AROM to atleast 45 degrees to allow him to return to golfing activities with his peers.   Time 8   Period Weeks   Status New     PT LONG TERM GOAL #3   Title Pt will perform SLS on each LE for up to 15 sec on a firm surface, to demonstrate improved proprioception during weight bearing activity.    Time 8   Period Weeks   Status New     PT LONG TERM GOAL #4   Title Pt will demo ankle DF and PF strength that is atleast 4/5 MMT, to allow him to ambulate without difficulty.   Time 8   Period Weeks   Status New               Plan - 10/20/16  1525    Clinical Impression Statement Session focus on ankle strengthenig on dynamic surface to improve proprioception to assist with balance and RTS activities.  Pt demonstrated good stable balance with BLE NBOS and tandem stance on dynamic surface, pt continues to have difficutly with SLS activities, min guard for safety.  No reports of increased pain through session.     Rehab Potential Good   PT Frequency 2x / week   PT Duration 8 weeks   PT Treatment/Interventions ADLs/Self Care Home Management;Cryotherapy;Therapeutic exercise;Therapeutic activities;Functional mobility training;Stair training;Gait training;Balance training;Neuromuscular re-education;Patient/family education;Orthotic Fit/Training;Manual techniques;Scar mobilization;Passive range of motion;Dry needling;Taping   PT Next Visit Plan Reassess next session.     PT Home Exercise Plan ankle PF/DF with blue TB, ankle DF stretch with strap 3x30 sec, ankle PF stretch 5x10 sec, great toe extension stretch 5x10 sec ; 2/22: standing gastroc stretch      Patient will benefit from skilled therapeutic intervention in order to improve the following deficits and impairments:  Abnormal gait, Decreased activity tolerance, Decreased strength, Increased fascial restricitons, Impaired flexibility, Pain, Improper body mechanics, Increased edema, Decreased scar mobility, Decreased range of motion, Decreased balance, Decreased endurance, Decreased mobility, Decreased skin integrity, Difficulty walking, Hypomobility, Impaired sensation  Visit Diagnosis: Pain in right ankle and joints of right foot  Other abnormalities of gait and mobility  Localized edema  Stiffness of right ankle, not elsewhere classified     Problem List Patient Active Problem List   Diagnosis Date Noted  . OA (osteoarthritis) of ankle 09/26/2012  . Pain in joint, ankle and foot 09/26/2012   Ihor Austin, [place]; CBIS [phone]  Aldona Lento 10/20/2016, 3:31  PM  [place], [place], [address] Phone: [phone]   Fax:  [phone]  Name: Dillon [name] MRN: [id number] Date of Birth: [birth year]

## 2016-10-25 ENCOUNTER — Ambulatory Visit (HOSPITAL_COMMUNITY): Payer: BC Managed Care – PPO | Admitting: Physical Therapy

## 2016-10-25 DIAGNOSIS — R2689 Other abnormalities of gait and mobility: Secondary | ICD-10-CM

## 2016-10-25 DIAGNOSIS — M25671 Stiffness of right ankle, not elsewhere classified: Secondary | ICD-10-CM | POA: Diagnosis not present

## 2016-10-25 DIAGNOSIS — R600 Localized edema: Secondary | ICD-10-CM

## 2016-10-25 DIAGNOSIS — M25571 Pain in right ankle and joints of right foot: Secondary | ICD-10-CM

## 2016-10-25 NOTE — Therapy (Signed)
[place], [place], [address] Phone: [phone]   Fax:  [phone]  Physical Therapy Treatment/Discharge  Patient Details  Name: Dillon [name] MRN: [id number] Date of Birth: 1956/06/25 Referring Provider: Lennox Grumbles, MD  Encounter Date: 10/25/2016      PT End of Session - 10/25/16 1741    Visit Number 16   Number of Visits 16   Date for PT Re-Evaluation 10/25/16   Authorization Type [employer]   Authorization Time Period 08/30/16 to 10/25/16   PT Start Time 1435   PT Stop Time 1505   PT Time Calculation (min) 30 min   Activity Tolerance Patient tolerated treatment well;No increased pain   Behavior During Therapy [facility] for tasks assessed/performed      No past medical history on file.  Past Surgical History:  Procedure Laterality Date  . KNEE SURGERY    . WRIST FRACTURE SURGERY      There were no vitals filed for this visit.      Subjective Assessment - 10/25/16 1437    Subjective Pt reports that things are going well. He is very pleased with his progress and even reports that he was able to work on his golf swing this past week.    Pertinent History Rt meniscectomy 7 years ago   Patient Stated Goals improve mobility and get back to golfing   Currently in Pain? Other (Comment)  None at this time, just a little across the forefoot.    Pain Onset More than a month ago            [facility] PT Assessment - 10/25/16 0001      Assessment   Medical Diagnosis Rt ankle replacement   Referring Provider Lennox Grumbles, MD   Onset Date/Surgical Date 07/22/16   Next MD Visit Sometime in December    Prior Therapy None      Precautions   Precautions Other (comment)   Precaution Comments ankle replacement protocol     Restrictions   Weight Bearing Restrictions --   Other Position/Activity Restrictions --     [place] residence     Prior Function   Level of  Independence Independent   Vocation Full time employment   Vocation Requirements coaches football      Cognition   Overall Cognitive Status Within Functional Limits for tasks assessed     Observation/Other Assessments   Observations Pt wearing compression garments on RLE     Sensation   Additional Comments Numbness and tingling right at the end of his toes      AROM   Right Ankle Dorsiflexion 6   Right Ankle Plantar Flexion 34   Right Ankle Inversion 12   Right Ankle Eversion 10     Strength   Right Ankle Dorsiflexion 5/5   Right Ankle Plantar Flexion 1/5   Right Ankle Eversion 4/5  inversion 3+/5     Transfers   Five time sit to stand comments  7.11 sec, no UE assistance.      Ambulation/Gait   Gait Comments ambulates with B axillary crutches, Rt CAM boot, 25% weight bearing      Balance   Balance Assessed No  unable at this time                             PT Education - 10/25/16 1740    Education provided  Yes   Education Details goals and remaining areas of limitation that pt should continue to work on at home; updated HEP   Person(s) Educated Patient   Methods Explanation;Other (comment);Demonstration  emailed pt a copy    Comprehension Verbalized understanding;Returned demonstration          PT Short Term Goals - 10/25/16 1444      PT SHORT TERM GOAL #1   Title Pt will demo consistency and independence with his HEP to improve ankle ROM and strength.    Time 2   Period Weeks   Status Achieved     PT SHORT TERM GOAL #2   Title Pt will demo improved ankle DF PROM to atleast 10 deg, to prepare for ambulation out of the CAM boot.    Baseline 5 deg   Time 4   Period Weeks   Status Partially Met     PT SHORT TERM GOAL #3   Title Pt will demo understanding of the importance of edema/inflammation management evident by his ability to verbalize ice/elevation parameters.    Time 2   Period Weeks   Status Achieved     PT SHORT TERM  GOAL #4   Title Pt will ascend/descend atleast 4, 6" steps with LRAD, 50% weightbearing and without LOB, to improve safety with stair negotiation at home.    Time 2   Period Weeks   Status Achieved           PT Long Term Goals - 10/25/16 1445      PT LONG TERM GOAL #1   Title Pt will demo ankle AROM to atleast 10 deg, to improve foot clearance with ambulation.    Baseline 5 deg    Time 8   Period Weeks   Status Partially Met     PT LONG TERM GOAL #2   Title Pt will demo improved ankle PF AROM to atleast 45 degrees to allow him to return to golfing activities with his peers.   Baseline 35 deg PF   Time 8   Period Weeks   Status Partially Met     PT LONG TERM GOAL #3   Title Pt will perform SLS on each LE for up to 15 sec on a firm surface, to demonstrate improved proprioception during weight bearing activity.    Baseline Lt: 6 sec, Rt: 6 sec    Time 8   Period Weeks   Status Partially Met     PT LONG TERM GOAL #4   Title Pt will demo ankle DF and PF strength that is atleast 4/5 MMT, to allow him to ambulate without difficulty.   Baseline Ankle DF strength 5/5, PF 3+/5   Time 8   Period Weeks   Status Achieved               Plan - 10/25/16 1741    Clinical Impression Statement Pt was discharged this visit having made great progress since beginning PT several weeks ago. He demonstrates improved ankle ROM, ankle strength, proprioception and activity tolerance, and he has been able to return to full work/leisure activity without reported difficulty as well. His current AROM of the Rt ankle reveals ~5 deg DF and 35 deg PF which is he very pleased with. He does continue to present with limitations in BLE proprioception, only able to maintain SLS for ~6 sec on each LE, and his ankle PF strength is a remaining limitation as well. At this time, he is pleased with  his progress and is walking/biking daily. Although he could continue to benefit from skilled PT, he feels  comfortable with discharge at this time to allow him to continue with his advanced HEP for further improvements in strength, ROM and mobility. His HEP was updated and reviewed and he had no further questions or concerns. Will sign off.    Rehab Potential Good   PT Frequency 2x / week   PT Duration 8 weeks   PT Treatment/Interventions ADLs/Self Care Home Management;Cryotherapy;Therapeutic exercise;Therapeutic activities;Functional mobility training;Stair training;Gait training;Balance training;Neuromuscular re-education;Patient/family education;Orthotic Fit/Training;Manual techniques;Scar mobilization;Passive range of motion;Dry needling;Taping   PT Next Visit Plan d/c home with HEP   PT Home Exercise Plan ankle PF with TB progressed to standing heel raises, rolling plantar fascia, SLS   Consulted and Agree with Plan of Care Patient      Patient will benefit from skilled therapeutic intervention in order to improve the following deficits and impairments:  Abnormal gait, Decreased activity tolerance, Decreased strength, Increased fascial restricitons, Impaired flexibility, Pain, Improper body mechanics, Increased edema, Decreased scar mobility, Decreased range of motion, Decreased balance, Decreased endurance, Decreased mobility, Decreased skin integrity, Difficulty walking, Hypomobility, Impaired sensation  Visit Diagnosis: Pain in right ankle and joints of right foot  Other abnormalities of gait and mobility  Localized edema  Stiffness of right ankle, not elsewhere classified    PHYSICAL THERAPY DISCHARGE SUMMARY  Visits from Start of Care: 16  Current functional level related to goals / functional outcomes: See above for more details   Remaining deficits: See above for more details   Education / Equipment: See above for more details Plan: Patient agrees to discharge.  Patient goals were partially met. Patient is being discharged due to being pleased with the current functional  level.  ?????       Problem List Patient Active Problem List   Diagnosis Date Noted  . OA (osteoarthritis) of ankle 09/26/2012  . Pain in joint, ankle and foot 09/26/2012    5:48 PM,10/25/16 Elly Modena PT, DPT [facility] [place] [address] [place], [place], [address] Phone: [phone]   Fax:  [phone]  Name: Dillon [name] MRN: [id number] Date of Birth: [birth year]

## 2017-04-04 ENCOUNTER — Ambulatory Visit: Payer: BC Managed Care – PPO | Admitting: Orthopedic Surgery

## 2017-09-06 ENCOUNTER — Encounter: Payer: Self-pay | Admitting: Family Medicine

## 2017-09-06 ENCOUNTER — Ambulatory Visit (INDEPENDENT_AMBULATORY_CARE_PROVIDER_SITE_OTHER): Payer: BC Managed Care – PPO | Admitting: Family Medicine

## 2017-09-06 VITALS — BP 134/84 | Ht 72.0 in | Wt 234.0 lb

## 2017-09-06 DIAGNOSIS — Z13220 Encounter for screening for lipoid disorders: Secondary | ICD-10-CM

## 2017-09-06 DIAGNOSIS — Z125 Encounter for screening for malignant neoplasm of prostate: Secondary | ICD-10-CM

## 2017-09-06 DIAGNOSIS — Z0000 Encounter for general adult medical examination without abnormal findings: Secondary | ICD-10-CM | POA: Diagnosis not present

## 2017-09-06 DIAGNOSIS — Z79899 Other long term (current) drug therapy: Secondary | ICD-10-CM

## 2017-09-06 MED ORDER — AMOXICILLIN-POT CLAVULANATE 875-125 MG PO TABS: 1.0000 | ORAL_TABLET | Freq: Two times a day (BID) | ORAL | 0 refills | Status: AC

## 2017-09-06 NOTE — Progress Notes (Signed)
   Subjective:    Patient ID: Dillon [name], male    DOB: 17-Jan-1956, 62 y.o.   MRN: [id number]  HPI The patient comes in today for a wellness visit.    A review of their health history was completed.  A review of medications was also completed.  Any needed refills; No  Eating habits: Good  Falls/  MVA accidents in past few months: No Regular exercise: yes  Specialist pt sees on regular basis: No  Preventative health issues were discussed.    Exercise going well at leat four days per week, not counting practi ceo rn mowing yard  Overall good control of diet   Additional concerns: cough  Was called in z pk and   Down in the chest productive cough     lno eye doc for a couple yrs    Ankle replacement has help ed a und h Stuck in the chest and alread   Review of Systems  Constitutional: Negative for activity change, appetite change and fever.  HENT: Negative for congestion and rhinorrhea.   Eyes: Negative for discharge.  Respiratory: Negative for cough and wheezing.   Cardiovascular: Negative for chest pain.  Gastrointestinal: Negative for abdominal pain, blood in stool and vomiting.  Genitourinary: Negative for difficulty urinating and frequency.  Musculoskeletal: Negative for neck pain.  Skin: Negative for rash.  Allergic/Immunologic: Negative for environmental allergies and food allergies.  Neurological: Negative for weakness and headaches.  Psychiatric/Behavioral: Negative for agitation.  All other systems reviewed and are negative.      Objective:   Physical Exam  Constitutional: He appears well-developed and well-nourished.  HENT:  Head: Normocephalic and atraumatic.  Right Ear: External ear normal.  Left Ear: External ear normal.  Nose: Nose normal.  Mouth/Throat: Oropharynx is clear and moist.  Eyes: Right eye exhibits no discharge. Left eye exhibits no discharge. No scleral icterus.  Neck: Normal range of motion. Neck supple. No thyromegaly  present.  Cardiovascular: Normal rate, regular rhythm and normal heart sounds.  No murmur heard. Pulmonary/Chest: Effort normal and breath sounds normal. No respiratory distress. He has no wheezes.  Abdominal: Soft. Bowel sounds are normal. He exhibits no distension and no mass. There is no tenderness.  Genitourinary: Penis normal.  Genitourinary Comments: Prostate exam within normal limits  Musculoskeletal: Normal range of motion. He exhibits no edema.  Lymphadenopathy:    He has no cervical adenopathy.  Neurological: He is alert. He exhibits normal muscle tone. Coordination normal.  Skin: Skin is warm and dry. No erythema.  Psychiatric: He has a normal mood and affect. His behavior is normal. Judgment normal.  Vitals reviewed.         Assessment & Plan:  Impression 1 wellness exam diet discussed.  Exercise discussed.  Proper blood work ordered.  Up-to-date on colonoscopy.  2.  Subacute bronchitis.  Patient given Z-Pak by a friend of his.  Did not get rid of his symptoms we will treat with Augmentin await blood results

## 2017-09-07 ENCOUNTER — Encounter: Payer: Self-pay | Admitting: Family Medicine

## 2017-09-07 LAB — LIPID PANEL
CHOLESTEROL TOTAL: 180 mg/dL (ref 100–199)
Chol/HDL Ratio: 3.3 ratio (ref 0.0–5.0)
HDL: 55 mg/dL (ref >39)
LDL Calculated: 112 mg/dL — ABNORMAL HIGH (ref 0–99)
Triglycerides: 65 mg/dL (ref 0–149)
VLDL CHOLESTEROL CAL: 13 mg/dL (ref 5–40)

## 2017-09-07 LAB — PSA: Prostate Specific Ag, Serum: 2.1 ng/mL (ref 0.0–4.0)

## 2017-09-07 LAB — HEPATIC FUNCTION PANEL
ALBUMIN: 4.2 g/dL (ref 3.6–4.8)
ALK PHOS: 94 IU/L (ref 39–117)
ALT: 22 IU/L (ref 0–44)
AST: 22 IU/L (ref 0–40)
BILIRUBIN TOTAL: 0.4 mg/dL (ref 0.0–1.2)
BILIRUBIN, DIRECT: 0.13 mg/dL (ref 0.00–0.40)
TOTAL PROTEIN: 6.8 g/dL (ref 6.0–8.5)

## 2017-09-07 LAB — BASIC METABOLIC PANEL
BUN / CREAT RATIO: 13 (ref 10–24)
BUN: 12 mg/dL (ref 8–27)
CO2: 25 mmol/L (ref 20–29)
CREATININE: 0.9 mg/dL (ref 0.76–1.27)
Calcium: 9.4 mg/dL (ref 8.6–10.2)
Chloride: 104 mmol/L (ref 96–106)
GFR, EST AFRICAN AMERICAN: 106 mL/min/{1.73_m2} (ref >59)
GFR, EST NON AFRICAN AMERICAN: 92 mL/min/{1.73_m2} (ref >59)
Glucose: 99 mg/dL (ref 65–99)
POTASSIUM: 4.6 mmol/L (ref 3.5–5.2)
Sodium: 141 mmol/L (ref 134–144)

## 2017-09-14 ENCOUNTER — Encounter: Payer: Self-pay | Admitting: Family Medicine

## 2018-03-07 ENCOUNTER — Encounter: Payer: Self-pay | Admitting: Family Medicine

## 2018-03-07 ENCOUNTER — Telehealth: Payer: Self-pay | Admitting: Family Medicine

## 2018-03-07 NOTE — Telephone Encounter (Signed)
Prophylactic antibiotics for tick bites is not recommended It is wise to watch for any signs of infection any large redness or unusual rashes or fever combined with body aches it would be wise to get checked The majority of tick bites that are removed within 24 hours typically do not cause infection If any problems coming up follow-up with an office visit

## 2018-03-07 NOTE — Telephone Encounter (Signed)
Left message to return call 

## 2018-03-07 NOTE — Telephone Encounter (Signed)
Pt returned call and verbalized understanding  

## 2018-03-07 NOTE — Telephone Encounter (Signed)
Pt sent MyChart message stating that wife pulled tick off him this morning. The tick was in his waistline area. He states he must have picked the tick up yesterday. Pt states he is feeling ok at the moment. No swelling or redness at the site at this time  Pt wanted to know if an ABT needed to be called in. Please advise. Thanks.

## 2018-06-11 ENCOUNTER — Telehealth: Payer: Self-pay | Admitting: Family Medicine

## 2018-06-11 NOTE — Telephone Encounter (Signed)
Received paper from [facility] stating that Flu vaccine was given. The date says 10/04/17; called pharmacy to make sure this was the right date. Jeani Hawking at [facility] told me to call Marcie Bal at [facility]. Left message to return call on [employer].

## 2018-06-20 NOTE — Telephone Encounter (Signed)
Flu shot was given 05/04/18 per [facility]

## 2018-09-05 ENCOUNTER — Telehealth: Payer: Self-pay | Admitting: Family Medicine

## 2018-09-05 DIAGNOSIS — Z0000 Encounter for general adult medical examination without abnormal findings: Secondary | ICD-10-CM

## 2018-09-05 DIAGNOSIS — Z79899 Other long term (current) drug therapy: Secondary | ICD-10-CM

## 2018-09-05 DIAGNOSIS — Z125 Encounter for screening for malignant neoplasm of prostate: Secondary | ICD-10-CM

## 2018-09-05 DIAGNOSIS — Z13220 Encounter for screening for lipoid disorders: Secondary | ICD-10-CM

## 2018-09-05 NOTE — Telephone Encounter (Signed)
Pt has cpe scheduled for 09/12/2018. He would like to have lab work done before appt.

## 2018-09-05 NOTE — Telephone Encounter (Signed)
Last had Hep,met7,psa,lipid. Please advise.

## 2018-09-05 NOTE — Telephone Encounter (Signed)
Rep same 

## 2018-09-06 NOTE — Telephone Encounter (Signed)
Orders put in

## 2018-09-06 NOTE — Telephone Encounter (Signed)
Patient is aware 

## 2018-09-09 LAB — BASIC METABOLIC PANEL
BUN / CREAT RATIO: 14 (ref 10–24)
BUN: 14 mg/dL (ref 8–27)
CHLORIDE: 104 mmol/L (ref 96–106)
CO2: 24 mmol/L (ref 20–29)
CREATININE: 1.01 mg/dL (ref 0.76–1.27)
Calcium: 9.5 mg/dL (ref 8.6–10.2)
GFR calc non Af Amer: 79 mL/min/{1.73_m2} (ref >59)
GFR, EST AFRICAN AMERICAN: 92 mL/min/{1.73_m2} (ref >59)
GLUCOSE: 97 mg/dL (ref 65–99)
Potassium: 4.8 mmol/L (ref 3.5–5.2)
Sodium: 144 mmol/L (ref 134–144)

## 2018-09-09 LAB — LIPID PANEL
CHOLESTEROL TOTAL: 164 mg/dL (ref 100–199)
Chol/HDL Ratio: 3 ratio (ref 0.0–5.0)
HDL: 54 mg/dL (ref >39)
LDL Calculated: 101 mg/dL — ABNORMAL HIGH (ref 0–99)
Triglycerides: 46 mg/dL (ref 0–149)
VLDL Cholesterol Cal: 9 mg/dL (ref 5–40)

## 2018-09-09 LAB — HEPATIC FUNCTION PANEL
ALK PHOS: 94 IU/L (ref 39–117)
ALT: 15 IU/L (ref 0–44)
AST: 16 IU/L (ref 0–40)
Albumin: 4.5 g/dL (ref 3.8–4.8)
BILIRUBIN, DIRECT: 0.11 mg/dL (ref 0.00–0.40)
Bilirubin Total: 0.5 mg/dL (ref 0.0–1.2)
Total Protein: 6.6 g/dL (ref 6.0–8.5)

## 2018-09-09 LAB — PSA: PROSTATE SPECIFIC AG, SERUM: 1.7 ng/mL (ref 0.0–4.0)

## 2018-09-12 ENCOUNTER — Ambulatory Visit (INDEPENDENT_AMBULATORY_CARE_PROVIDER_SITE_OTHER): Payer: BC Managed Care – PPO | Admitting: Family Medicine

## 2018-09-12 ENCOUNTER — Encounter: Payer: Self-pay | Admitting: Family Medicine

## 2018-09-12 VITALS — BP 138/88 | Ht 72.0 in | Wt 232.8 lb

## 2018-09-12 DIAGNOSIS — Z23 Encounter for immunization: Secondary | ICD-10-CM

## 2018-09-12 DIAGNOSIS — Z0000 Encounter for general adult medical examination without abnormal findings: Secondary | ICD-10-CM | POA: Diagnosis not present

## 2018-09-12 NOTE — Progress Notes (Signed)
Subjective:    Patient ID: Dillon [name], male    DOB: 09/03/1955, 63 y.o.   MRN: [id number]  HPI The patient comes in today for a wellness visit.    A review of their health history was completed.  A review of medications was also completed.  Any needed refills; no  Eating habits:  Eating pretty good  Falls/  MVA accidents in past few months: none  Regular exercise: yes  Specialist pt sees on regular basis: no  Preventative health issues were discussed.   Additional concerns: none  Diet overall decent   Results for orders placed or performed in visit on 09/05/18  Lipid panel  Result Value Ref Range   Cholesterol, Total 164 100 - 199 mg/dL   Triglycerides 46 0 - 149 mg/dL   HDL 54 >39 mg/dL   VLDL Cholesterol Cal 9 5 - 40 mg/dL   LDL Calculated 101 (H) 0 - 99 mg/dL   Chol/HDL Ratio 3.0 0.0 - 5.0 ratio  Basic metabolic panel  Result Value Ref Range   Glucose 97 65 - 99 mg/dL   BUN 14 8 - 27 mg/dL   Creatinine, Ser 1.01 0.76 - 1.27 mg/dL   GFR calc non Af Amer 79 >59 mL/min/1.73   GFR calc Af Amer 92 >59 mL/min/1.73   BUN/Creatinine Ratio 14 10 - 24   Sodium 144 134 - 144 mmol/L   Potassium 4.8 3.5 - 5.2 mmol/L   Chloride 104 96 - 106 mmol/L   CO2 24 20 - 29 mmol/L   Calcium 9.5 8.6 - 10.2 mg/dL  Hepatic function panel  Result Value Ref Range   Total Protein 6.6 6.0 - 8.5 g/dL   Albumin 4.5 3.8 - 4.8 g/dL   Bilirubin Total 0.5 0.0 - 1.2 mg/dL   Bilirubin, Direct 0.11 0.00 - 0.40 mg/dL   Alkaline Phosphatase 94 39 - 117 IU/L   AST 16 0 - 40 IU/L   ALT 15 0 - 44 IU/L  PSA  Result Value Ref Range   Prostate Specific Ag, Serum 1.7 0.0 - 4.0 ng/mL   nubers awesoe   Number sover all good on  Review of Systems  Constitutional: Negative for activity change, appetite change and fever.  HENT: Negative for congestion and rhinorrhea.   Eyes: Negative for discharge.  Respiratory: Negative for cough and wheezing.   Cardiovascular: Negative for chest pain.    Gastrointestinal: Negative for abdominal pain, blood in stool and vomiting.  Genitourinary: Negative for difficulty urinating and frequency.  Musculoskeletal: Negative for neck pain.  Skin: Negative for rash.  Allergic/Immunologic: Negative for environmental allergies and food allergies.  Neurological: Negative for weakness and headaches.  Psychiatric/Behavioral: Negative for agitation.  All other systems reviewed and are negative.      Objective:   Physical Exam Vitals signs reviewed.  Constitutional:      Appearance: He is well-developed.  HENT:     Head: Normocephalic and atraumatic.     Right Ear: External ear normal.     Left Ear: External ear normal.     Nose: Nose normal.  Eyes:     Pupils: Pupils are equal, round, and reactive to light.  Neck:     Musculoskeletal: Normal range of motion and neck supple.     Thyroid: No thyromegaly.  Cardiovascular:     Rate and Rhythm: Normal rate and regular rhythm.     Heart sounds: Normal heart sounds. No murmur.  Pulmonary:  Effort: Pulmonary effort is normal. No respiratory distress.     Breath sounds: Normal breath sounds. No wheezing.  Abdominal:     General: Bowel sounds are normal. There is no distension.     Palpations: Abdomen is soft. There is no mass.     Tenderness: There is no abdominal tenderness.  Genitourinary:    Penis: Normal.      Prostate: Normal.  Musculoskeletal: Normal range of motion.  Lymphadenopathy:     Cervical: No cervical adenopathy.  Skin:    General: Skin is warm and dry.     Findings: No erythema.  Neurological:     Mental Status: He is alert.     Motor: No abnormal muscle tone.  Psychiatric:        Behavior: Behavior normal.        Judgment: Judgment normal.           Assessment & Plan:  1 wellness exam   Colo sceen ing discussed.   Last colon done ten yrs ago,pt developed dropped foot afterwards, patient would prefer to not do a colonoscopy for these reasons.  He is  strongly requesting both Cologuard instead.  We discussed that this is not as good a test in terms of sensitivity.  And he understands that. Diet discussed.  Exercise discussed.  Blood work discussed.  Vaccine options discussed.  Patient to receive Tdap today.  Up-to-date on shingles vaccine and flu shot

## 2018-10-08 ENCOUNTER — Encounter: Payer: Self-pay | Admitting: Family Medicine

## 2018-10-09 ENCOUNTER — Other Ambulatory Visit: Payer: Self-pay | Admitting: Family Medicine

## 2018-10-17 ENCOUNTER — Telehealth: Payer: Self-pay | Admitting: Family Medicine

## 2018-10-17 NOTE — Telephone Encounter (Signed)
Pt dropped off form for [place] to send in for cologuard. Form placed in nurse box at nurse station.

## 2018-10-17 NOTE — Telephone Encounter (Signed)
Pt filled out patient part. Will mail to company.

## 2018-10-23 ENCOUNTER — Encounter: Payer: Self-pay | Admitting: Family Medicine

## 2018-10-23 ENCOUNTER — Ambulatory Visit (INDEPENDENT_AMBULATORY_CARE_PROVIDER_SITE_OTHER): Payer: BC Managed Care – PPO | Admitting: Family Medicine

## 2018-10-23 ENCOUNTER — Other Ambulatory Visit: Payer: Self-pay

## 2018-10-23 VITALS — Temp 98.1°F | Wt 231.4 lb

## 2018-10-23 DIAGNOSIS — S30860A Insect bite (nonvenomous) of lower back and pelvis, initial encounter: Secondary | ICD-10-CM

## 2018-10-23 DIAGNOSIS — W57XXXA Bitten or stung by nonvenomous insect and other nonvenomous arthropods, initial encounter: Secondary | ICD-10-CM

## 2018-10-23 MED ORDER — DOXYCYCLINE HYCLATE 100 MG PO TABS: 100.0000 mg | ORAL_TABLET | Freq: Two times a day (BID) | ORAL | 0 refills | Status: DC

## 2018-10-23 NOTE — Telephone Encounter (Signed)
Pt in for appt today.

## 2018-10-23 NOTE — Progress Notes (Signed)
   Subjective:    Patient ID: Dillon [name], male    DOB: 12/29/1955, 63 y.o.   MRN: [id number]  HPI Pt here today for tick bite. Occurred yesterday. On bottom on left buttock area. Not painful but is red at bite site. Pt wife did pull tick out last night, came out in pieces.   No fevers or joint pain or h/a .  Review of Systems  Constitutional: Negative for chills and fever.  Musculoskeletal: Negative for arthralgias.  Skin: Negative for rash.  Neurological: Negative for headaches.       Objective:   Physical Exam Vitals signs and nursing note reviewed. Exam conducted with a chaperone present.  Constitutional:      General: He is not in acute distress.    Appearance: Normal appearance. He is not toxic-appearing.  HENT:     Head: Normocephalic and atraumatic.  Neck:     Musculoskeletal: Neck supple.  Pulmonary:     Effort: No respiratory distress.  Skin:    General: Skin is warm and dry.     Comments: Small area of erythema with a dark spot centrally to left buttock, possibly still part of the tick remaining. Non-tender, no drainage noted.   Neurological:     Mental Status: He is alert and oriented to person, place, and time.  Psychiatric:        Behavior: Behavior normal.           Assessment & Plan:  Tick bite, initial encounter  Discussed with patient that remaining parts of tick should naturally expel on their own over time. Will treat prophylactically with doxy x 10 days. Should monitor for signs of infection or tick-borne illness, f/u if area becomes swollen, purulent drainage, fever, joint pain, h/a, rash develops.

## 2018-10-30 ENCOUNTER — Encounter: Payer: Self-pay | Admitting: Family Medicine

## 2018-11-02 LAB — COLOGUARD

## 2018-11-12 ENCOUNTER — Encounter: Payer: Self-pay | Admitting: Family Medicine

## 2018-11-12 ENCOUNTER — Telehealth: Payer: Self-pay | Admitting: *Deleted

## 2018-11-12 ENCOUNTER — Other Ambulatory Visit: Payer: Self-pay | Admitting: Family Medicine

## 2018-11-12 DIAGNOSIS — K921 Melena: Secondary | ICD-10-CM

## 2018-11-12 NOTE — Telephone Encounter (Signed)
cologurd results

## 2018-11-12 NOTE — Telephone Encounter (Signed)
Call pt. Let him know his cologuard test came back positive. I know this is disappointing to him cause he clearly did not want a colonoscopy, but he definietley needs one! Chance of colon cancer one out o f 25 with this, chance of advanced polyp that will turn into cancer at one point one out of five, we will put referr in to g I of his choice, there wlikely will be delay in getting this done which is appropriat e under circumstances

## 2018-11-12 NOTE — Telephone Encounter (Signed)
Pt contacted and verbalized understanding. Referral placed to [place].

## 2018-11-14 ENCOUNTER — Encounter (INDEPENDENT_AMBULATORY_CARE_PROVIDER_SITE_OTHER): Payer: Self-pay | Admitting: Internal Medicine

## 2018-11-14 ENCOUNTER — Other Ambulatory Visit: Payer: Self-pay

## 2018-11-14 ENCOUNTER — Ambulatory Visit (INDEPENDENT_AMBULATORY_CARE_PROVIDER_SITE_OTHER): Payer: BC Managed Care – PPO | Admitting: Internal Medicine

## 2018-11-14 VITALS — BP 155/98 | HR 77 | Temp 98.2°F | Ht 72.0 in | Wt 223.7 lb

## 2018-11-14 DIAGNOSIS — R195 Other fecal abnormalities: Secondary | ICD-10-CM

## 2018-11-14 NOTE — Progress Notes (Signed)
   Subjective:    Patient ID: Dillon [name], male    DOB: 1956-01-20, 63 y.o.   MRN: [id number]  Patient had drop foot after his last colonoscopy in [place] x 2 days  HPI  Referred by Dr. Mickie Hillier for positive cologuard. There has been no change in his stool. His last colonoscopy 10 yrs ago in [place] and was normal. No unintentional weight loss. His appetite is good.  Grandmother had colon cancer around 68 yrs old.    Review of Systems History reviewed. No pertinent past medical history.  Past Surgical History:  Procedure Laterality Date  . KNEE SURGERY    . WRIST FRACTURE SURGERY      No Known Allergies  Current Outpatient Medications on File Prior to Visit  Medication Sig Dispense Refill  . aspirin 81 MG chewable tablet Chew by mouth daily.    . cetirizine (ZYRTEC) 10 MG chewable tablet Chew 10 mg by mouth daily.    . diazepam (VALIUM) 5 MG tablet Take 5 mg by mouth every 6 (six) hours as needed for anxiety.    . lactobacillus acidophilus (BACID) TABS tablet Take 2 tablets by mouth 3 (three) times daily.    . Omega-3 Fatty Acids (FISH OIL) 1200 MG CAPS Take by mouth daily.    Marland Kitchen omeprazole (PRILOSEC) 20 MG capsule Take 20 mg by mouth daily.    Marland Kitchen OVER THE COUNTER MEDICATION Elerberry with zince    . TURMERIC PO Take 1 tablet by mouth daily.    . vitamin C (ASCORBIC ACID) 250 MG tablet Take 250 mg by mouth daily.     No current facility-administered medications on file prior to visit.         Objective:   Physical Exam Blood pressure (!) 155/98, pulse 77, temperature 98.2 F (36.8 C), height 6' (1.829 m), weight 223 lb 11.2 oz (101.5 kg).        Assessment & Plan:  Positive cologuard. Colonic neoplasm needs to be ruled out.  Colonoscopy.

## 2018-11-14 NOTE — Patient Instructions (Signed)
The risks of bleeding, perforation and infection were reviewed with patient.  

## 2018-12-25 ENCOUNTER — Encounter (INDEPENDENT_AMBULATORY_CARE_PROVIDER_SITE_OTHER): Payer: Self-pay

## 2018-12-29 ENCOUNTER — Encounter: Payer: Self-pay | Admitting: Family Medicine

## 2019-01-14 ENCOUNTER — Encounter (INDEPENDENT_AMBULATORY_CARE_PROVIDER_SITE_OTHER): Payer: Self-pay | Admitting: *Deleted

## 2019-01-14 ENCOUNTER — Telehealth (INDEPENDENT_AMBULATORY_CARE_PROVIDER_SITE_OTHER): Payer: Self-pay | Admitting: *Deleted

## 2019-01-14 ENCOUNTER — Other Ambulatory Visit (INDEPENDENT_AMBULATORY_CARE_PROVIDER_SITE_OTHER): Payer: Self-pay | Admitting: Internal Medicine

## 2019-01-14 DIAGNOSIS — R195 Other fecal abnormalities: Secondary | ICD-10-CM | POA: Insufficient documentation

## 2019-01-14 MED ORDER — SUPREP BOWEL PREP KIT 17.5-3.13-1.6 GM/177ML PO SOLN: 1.0000 | Freq: Once | ORAL | 0 refills | Status: AC

## 2019-01-14 NOTE — Telephone Encounter (Signed)
Patient needs suprep 

## 2019-01-21 ENCOUNTER — Other Ambulatory Visit: Payer: Self-pay

## 2019-01-21 ENCOUNTER — Other Ambulatory Visit (HOSPITAL_COMMUNITY)
Admission: RE | Admit: 2019-01-21 | Discharge: 2019-01-21 | Disposition: A | Discharge status: Home / Self Care | Payer: BC Managed Care – PPO | Source: Ambulatory Visit | Attending: Internal Medicine | Admitting: Internal Medicine

## 2019-01-21 DIAGNOSIS — Z1159 Encounter for screening for other viral diseases: Secondary | ICD-10-CM | POA: Diagnosis present

## 2019-01-21 DIAGNOSIS — R195 Other fecal abnormalities: Secondary | ICD-10-CM | POA: Diagnosis not present

## 2019-01-22 LAB — NOVEL CORONAVIRUS, NAA (HOSP ORDER, SEND-OUT TO REF LAB; TAT 18-24 HRS): SARS-CoV-2, NAA: NOT DETECTED

## 2019-01-24 ENCOUNTER — Other Ambulatory Visit: Payer: Self-pay

## 2019-01-24 ENCOUNTER — Ambulatory Visit (HOSPITAL_COMMUNITY)
Admission: RE | Admit: 2019-01-24 | Discharge: 2019-01-24 | Disposition: A | Discharge status: Home / Self Care | Payer: BC Managed Care – PPO | Attending: Internal Medicine | Admitting: Internal Medicine

## 2019-01-24 ENCOUNTER — Encounter (HOSPITAL_COMMUNITY)
Admission: RE | Disposition: A | Discharge status: Home / Self Care | Payer: Self-pay | Source: Home / Self Care | Attending: Internal Medicine

## 2019-01-24 DIAGNOSIS — K5730 Diverticulosis of large intestine without perforation or abscess without bleeding: Secondary | ICD-10-CM | POA: Diagnosis not present

## 2019-01-24 DIAGNOSIS — Z809 Family history of malignant neoplasm, unspecified: Secondary | ICD-10-CM | POA: Insufficient documentation

## 2019-01-24 DIAGNOSIS — D122 Benign neoplasm of ascending colon: Secondary | ICD-10-CM | POA: Diagnosis not present

## 2019-01-24 DIAGNOSIS — R195 Other fecal abnormalities: Secondary | ICD-10-CM | POA: Diagnosis not present

## 2019-01-24 DIAGNOSIS — D125 Benign neoplasm of sigmoid colon: Secondary | ICD-10-CM | POA: Diagnosis not present

## 2019-01-24 DIAGNOSIS — D123 Benign neoplasm of transverse colon: Secondary | ICD-10-CM | POA: Insufficient documentation

## 2019-01-24 DIAGNOSIS — K6289 Other specified diseases of anus and rectum: Secondary | ICD-10-CM

## 2019-01-24 DIAGNOSIS — Z79899 Other long term (current) drug therapy: Secondary | ICD-10-CM | POA: Diagnosis not present

## 2019-01-24 DIAGNOSIS — L02519 Cutaneous abscess of unspecified hand: Secondary | ICD-10-CM | POA: Insufficient documentation

## 2019-01-24 DIAGNOSIS — K644 Residual hemorrhoidal skin tags: Secondary | ICD-10-CM | POA: Diagnosis not present

## 2019-01-24 DIAGNOSIS — K921 Melena: Secondary | ICD-10-CM | POA: Diagnosis present

## 2019-01-24 DIAGNOSIS — Z7982 Long term (current) use of aspirin: Secondary | ICD-10-CM | POA: Diagnosis not present

## 2019-01-24 HISTORY — PX: COLONOSCOPY: SHX5424

## 2019-01-24 HISTORY — PX: POLYPECTOMY: SHX5525

## 2019-01-24 SURGERY — COLONOSCOPY
Anesthesia: Moderate Sedation

## 2019-01-24 MED ORDER — MIDAZOLAM HCL 5 MG/5ML IJ SOLN
INTRAMUSCULAR | Status: DC | PRN
  Administered 2019-01-24: 2 mg via INTRAVENOUS
  Administered 2019-01-24: 1 mg via INTRAVENOUS
  Administered 2019-01-24: 2 mg via INTRAVENOUS
  Administered 2019-01-24: 3 mg via INTRAVENOUS
  Administered 2019-01-24: 2 mg via INTRAVENOUS

## 2019-01-24 MED ORDER — STERILE WATER FOR IRRIGATION IR SOLN
Status: DC | PRN
  Administered 2019-01-24: 2.5 mL

## 2019-01-24 MED ORDER — MEPERIDINE HCL 50 MG/ML IJ SOLN
INTRAMUSCULAR | Status: DC | PRN
  Administered 2019-01-24 (×3): 25 mg via INTRAVENOUS

## 2019-01-24 MED ORDER — MEPERIDINE HCL 50 MG/ML IJ SOLN
INTRAMUSCULAR | Status: AC
  Filled 2019-01-24: qty 1

## 2019-01-24 MED ORDER — MIDAZOLAM HCL 5 MG/5ML IJ SOLN
INTRAMUSCULAR | Status: AC
  Filled 2019-01-24: qty 10

## 2019-01-24 MED ORDER — SODIUM CHLORIDE 0.9 % IV SOLN
INTRAVENOUS | Status: DC
  Administered 2019-01-24: 10:00:00 via INTRAVENOUS

## 2019-01-24 NOTE — H&P (Signed)
Dillon [name] is an 63 y.o. male.   Chief Complaint: Patient is here for colonoscopy. HPI: Patient is 63 year old Caucasian male who had Cologuard test for CRC screening and it came back positive.  He has no symptoms.  He denies abdominal pain change in bowel habits or rectal bleeding or abdominal pain. Last colonoscopy was 9 years ago elsewhere and he had foot drop for 2 days.  He does not remember if he had back. Patient has been on doxycycline since last Sunday for insect/tick bite. Family history is positive for CRC in maternal grandmother who was in the 62s.  No past medical history on file.  Past Surgical History:  Procedure Laterality Date  . KNEE SURGERY    . WRIST FRACTURE SURGERY      Family History  Problem Relation Age of Onset  . Cancer Father    Social History:  reports that he has never smoked. He has never used smokeless tobacco. He reports current alcohol use. He reports that he does not use drugs.  Allergies: No Known Allergies  Medications Prior to Admission  Medication Sig Dispense Refill  . Ascorbic Acid (VITAMIN C PO) Take 1 tablet by mouth daily.    Marland Kitchen aspirin 81 MG chewable tablet Chew 81 mg by mouth daily.     . cetirizine (ZYRTEC) 10 MG tablet Take 10 mg by mouth at bedtime.    Marland Kitchen doxycycline (VIBRAMYCIN) 100 MG capsule Take 100 mg by mouth 2 (two) times a day.    Marland Kitchen ELDERBERRY PO Take 1 tablet by mouth daily.    Marland Kitchen ibuprofen (ADVIL) 200 MG tablet Take 600 mg by mouth every 8 (eight) hours as needed (for pain.).    Marland Kitchen Multiple Vitamin (MULTIVITAMIN WITH MINERALS) TABS tablet Take 1 tablet by mouth daily.    . Omega-3 Fatty Acids (FISH OIL PO) Take 1 capsule by mouth daily.    . Probiotic Product (PROBIOTIC PO) Take 1 tablet by mouth daily.    . TURMERIC PO Take 1 tablet by mouth daily.      No results found for this or any previous visit (from the past 48 hour(s)). No results found.  ROS  Pulse 75, temperature 98.2 F (36.8 C), temperature source  Oral, resp. rate 18, height 6' (1.829 m), weight 99.8 kg. Physical Exam  Constitutional: He appears well-developed and well-nourished.  HENT:  Mouth/Throat: Oropharynx is clear and moist.  Eyes: Conjunctivae are normal. No scleral icterus.  Neck: No thyromegaly present.  Cardiovascular: Normal rate, regular rhythm and normal heart sounds.  No murmur heard. Respiratory: Effort normal and breath sounds normal.  GI: Soft. He exhibits no distension and no mass. There is no abdominal tenderness.  Musculoskeletal:        General: No edema.  Lymphadenopathy:    He has no cervical adenopathy.  Neurological: He is alert.  Skin: Skin is warm and dry.     Assessment/Plan Positive Cologuard test. Diagnostic colonoscopy.  Hildred Laser, MD 01/24/2019, 10:19 AM

## 2019-01-24 NOTE — Discharge Instructions (Signed)
High-Fiber Diet Fiber, also called dietary fiber, is a type of carbohydrate that is found in fruits, vegetables, whole grains, and beans. A high-fiber diet can have many health benefits. Your health care provider may recommend a high-fiber diet to help:  Prevent constipation. Fiber can make your bowel movements more regular.  Lower your cholesterol.  Relieve the following conditions: ? Swelling of veins in the anus (hemorrhoids). ? Swelling and irritation (inflammation) of specific areas of the digestive tract (uncomplicated diverticulosis). ? A problem of the large intestine (colon) that sometimes causes pain and diarrhea (irritable bowel syndrome, IBS).  Prevent overeating as part of a weight-loss plan.  Prevent heart disease, type 2 diabetes, and certain cancers. What is my plan? The recommended daily fiber intake in grams (g) includes:  38 g for men age 60 or younger.  30 g for men over age 55.  45 g for women age 54 or younger.  21 g for women over age 81. You can get the recommended daily intake of dietary fiber by:  Eating a variety of fruits, vegetables, grains, and beans.  Taking a fiber supplement, if it is not possible to get enough fiber through your diet. What do I need to know about a high-fiber diet?  It is better to get fiber through food sources rather than from fiber supplements. There is not a lot of research about how effective supplements are.  Always check the fiber content on the nutrition facts label of any prepackaged food. Look for foods that contain 5 g of fiber or more per serving.  Talk with a diet and nutrition specialist (dietitian) if you have questions about specific foods that are recommended or not recommended for your medical condition, especially if those foods are not listed below.  Gradually increase how much fiber you consume. If you increase your intake of dietary fiber too quickly, you may have bloating, cramping, or gas.  Drink plenty  of water. Water helps you to digest fiber. What are tips for following this plan?  Eat a wide variety of high-fiber foods.  Make sure that half of the grains that you eat each day are whole grains.  Eat breads and cereals that are made with whole-grain flour instead of refined flour or white flour.  Eat brown rice, bulgur wheat, or millet instead of white rice.  Start the day with a breakfast that is high in fiber, such as a cereal that contains 5 g of fiber or more per serving.  Use beans in place of meat in soups, salads, and pasta dishes.  Eat high-fiber snacks, such as berries, raw vegetables, nuts, and popcorn.  Choose whole fruits and vegetables instead of processed forms like juice or sauce. What foods can I eat?  Fruits Berries. Pears. Apples. Oranges. Avocado. Prunes and raisins. Dried figs. Vegetables Sweet potatoes. Spinach. Kale. Artichokes. Cabbage. Broccoli. Cauliflower. Green peas. Carrots. Squash. Grains Whole-grain breads. Multigrain cereal. Oats and oatmeal. Brown rice. Barley. Bulgur wheat. Garden. Quinoa. Bran muffins. Popcorn. Rye wafer crackers. Meats and other proteins Navy, kidney, and pinto beans. Soybeans. Split peas. Lentils. Nuts and seeds. Dairy Fiber-fortified yogurt. Beverages Fiber-fortified soy milk. Fiber-fortified orange juice. Other foods Fiber bars. The items listed above may not be a complete list of recommended foods and beverages. Contact a dietitian for more options. What foods are not recommended? Fruits Fruit juice. Cooked, strained fruit. Vegetables Fried potatoes. Canned vegetables. Well-cooked vegetables. Grains White bread. Pasta made with refined flour. White rice. Meats and other  proteins Fatty cuts of meat. Fried chicken or fried fish. Dairy Milk. Yogurt. Cream cheese. Sour cream. Fats and oils Butters. Beverages Soft drinks. Other foods Cakes and pastries. The items listed above may not be a complete list of foods  and beverages to avoid. Contact a dietitian for more information. Summary  Fiber is a type of carbohydrate. It is found in fruits, vegetables, whole grains, and beans.  There are many health benefits of eating a high-fiber diet, such as preventing constipation, lowering blood cholesterol, helping with weight loss, and reducing your risk of heart disease, diabetes, and certain cancers.  Gradually increase your intake of fiber. Increasing too fast can result in cramping, bloating, and gas. Drink plenty of water while you increase your fiber.  The best sources of fiber include whole fruits and vegetables, whole grains, nuts, seeds, and beans. This information is not intended to replace advice given to you by your health care provider. Make sure you discuss any questions you have with your health care provider. Document Released: 07/25/2005 Document Revised: 05/29/2017 Document Reviewed: 05/29/2017 Elsevier Interactive Patient Education  2019 [employer]. Colonoscopy, Adult, Care After This sheet gives you information about how to care for yourself after your procedure. Your health care provider may also give you more specific instructions. If you have problems or questions, contact your health care provider. What can I expect after the procedure? After the procedure, it is common to have:  A small amount of blood in your stool for 24 hours after the procedure.  Some gas.  Mild abdominal cramping or bloating. Follow these instructions at home: General instructions  For the first 24 hours after the procedure: ? Do not drive or use machinery. ? Do not sign important documents. ? Do not drink alcohol. ? Do your regular daily activities at a slower pace than normal. ? Eat soft, easy-to-digest foods.  Take over-the-counter or prescription medicines only as told by your health care provider. Relieving cramping and bloating   Try walking around when you have cramps or feel  bloated.  Apply heat to your abdomen as told by your health care provider. Use a heat source that your health care provider recommends, such as a moist heat pack or a heating pad. ? Place a towel between your skin and the heat source. ? Leave the heat on for 20-30 minutes. ? Remove the heat if your skin turns bright red. This is especially important if you are unable to feel pain, heat, or cold. You may have a greater risk of getting burned. Eating and drinking   Drink enough fluid to keep your urine pale yellow.  Resume your normal diet as instructed by your health care provider. Avoid heavy or fried foods that are hard to digest.  Avoid drinking alcohol for as long as instructed by your health care provider. Contact a health care provider if:  You have blood in your stool 2-3 days after the procedure. Get help right away if:  You have more than a small spotting of blood in your stool.  You pass large blood clots in your stool.  Your abdomen is swollen.  You have nausea or vomiting.  You have a fever.  You have increasing abdominal pain that is not relieved with medicine. Summary  After the procedure, it is common to have a small amount of blood in your stool. You may also have mild abdominal cramping and bloating.  For the first 24 hours after the procedure, do not drive  or use machinery, sign important documents, or drink alcohol.  Contact your health care provider if you have a lot of blood in your stool, nausea or vomiting, a fever, or increased abdominal pain. This information is not intended to replace advice given to you by your health care provider. Make sure you discuss any questions you have with your health care provider. Document Released: 03/08/2004 Document Revised: 05/17/2017 Document Reviewed: 10/06/2015 Elsevier Interactive Patient Education  2019 [employer] Colon Polyps  Polyps are tissue growths inside the body. Polyps can grow in many places,  including the large intestine (colon). A polyp may be a round bump or a mushroom-shaped growth. You could have one polyp or several. Most colon polyps are noncancerous (benign). However, some colon polyps can become cancerous over time. Finding and removing the polyps early can help prevent this. What are the causes? The exact cause of colon polyps is not known. What increases the risk? You are more likely to develop this condition if you:  Have a family history of colon cancer or colon polyps.  Are older than 78 or older than 45 if you are African American.  Have inflammatory bowel disease, such as ulcerative colitis or Crohn's disease.  Have certain hereditary conditions, such as: ? Familial adenomatous polyposis. ? Lynch syndrome. ? Turcot syndrome. ? Peutz-Jeghers syndrome.  Are overweight.  Smoke cigarettes.  Do not get enough exercise.  Drink too much alcohol.  Eat a diet that is high in fat and red meat and low in fiber.  Had childhood cancer that was treated with abdominal radiation. What are the signs or symptoms? Most polyps do not cause symptoms. If you have symptoms, they may include:  Blood coming from your rectum when having a bowel movement.  Blood in your stool. The stool may look dark red or black.  Abdominal pain.  A change in bowel habits, such as constipation or diarrhea. How is this diagnosed? This condition is diagnosed with a colonoscopy. This is a procedure in which a lighted, flexible scope is inserted into the anus and then passed into the colon to examine the area. Polyps are sometimes found when a colonoscopy is done as part of routine cancer screening tests. How is this treated? Treatment for this condition involves removing any polyps that are found. Most polyps can be removed during a colonoscopy. Those polyps will then be tested for cancer. Additional treatment may be needed depending on the results of testing. Follow these instructions at  home: Lifestyle  Maintain a healthy weight, or lose weight if recommended by your health care provider.  Exercise every day or as told by your health care provider.  Do not use any products that contain nicotine or tobacco, such as cigarettes and e-cigarettes. If you need help quitting, ask your health care provider.  If you drink alcohol, limit how much you have: ? 0-1 drink a day for women. ? 0-2 drinks a day for men.  Be aware of how much alcohol is in your drink. In the [place] one drink equals one 12 oz bottle of beer (355 mL), one 5 oz glass of wine (148 mL), or one 1 oz shot of hard liquor (44 mL). Eating and drinking   Eat foods that are high in fiber, such as fruits, vegetables, and whole grains.  Eat foods that are high in calcium and vitamin D, such as milk, cheese, yogurt, eggs, liver, fish, and broccoli.  Limit foods that are high in fat, such as  fried foods and desserts.  Limit the amount of red meat and processed meat you eat, such as hot dogs, sausage, bacon, and lunch meats. General instructions  Keep all follow-up visits as told by your health care provider. This is important. ? This includes having regularly scheduled colonoscopies. ? Talk to your health care provider about when you need a colonoscopy. Contact a health care provider if:  You have new or worsening bleeding during a bowel movement.  You have new or increased blood in your stool.  You have a change in bowel habits.  You lose weight for no known reason. Summary  Polyps are tissue growths inside the body. Polyps can grow in many places, including the colon.  Most colon polyps are noncancerous (benign), but some can become cancerous over time.  This condition is diagnosed with a colonoscopy.  Treatment for this condition involves removing any polyps that are found. Most polyps can be removed during a colonoscopy. This information is not intended to replace advice given to you by your health  care provider. Make sure you discuss any questions you have with your health care provider. Document Released: 04/20/2004 Document Revised: 11/09/2017 Document Reviewed: 11/09/2017 Elsevier Interactive Patient Education  2019 [employer]. Diverticulosis  Diverticulosis is a condition that develops when small pouches (diverticula) form in the wall of the large intestine (colon). The colon is where water is absorbed and stool is formed. The pouches form when the inside layer of the colon pushes through weak spots in the outer layers of the colon. You may have a few pouches or many of them. What are the causes? The cause of this condition is not known. What increases the risk? The following factors may make you more likely to develop this condition:  Being older than age 79. Your risk for this condition increases with age. Diverticulosis is rare among people younger than age 22. By age 72, many people have it.  Eating a low-fiber diet.  Having frequent constipation.  Being overweight.  Not getting enough exercise.  Smoking.  Taking over-the-counter pain medicines, like aspirin and ibuprofen.  Having a family history of diverticulosis. What are the signs or symptoms? In most people, there are no symptoms of this condition. If you do have symptoms, they may include:  Bloating.  Cramps in the abdomen.  Constipation or diarrhea.  Pain in the lower left side of the abdomen. How is this diagnosed? This condition is most often diagnosed during an exam for other colon problems. Because diverticulosis usually has no symptoms, it often cannot be diagnosed independently. This condition may be diagnosed by:  Using a flexible scope to examine the colon (colonoscopy).  Taking an X-ray of the colon after dye has been put into the colon (barium enema).  Doing a CT scan. How is this treated? You may not need treatment for this condition if you have never developed an infection related to  diverticulosis. If you have had an infection before, treatment may include:  Eating a high-fiber diet. This may include eating more fruits, vegetables, and grains.  Taking a fiber supplement.  Taking a live bacteria supplement (probiotic).  Taking medicine to relax your colon.  Taking antibiotic medicines. Follow these instructions at home:  Drink 6-8 glasses of water or more each day to prevent constipation.  Try not to strain when you have a bowel movement.  If you have had an infection before: ? Eat more fiber as directed by your health care provider or your  diet and nutrition specialist (dietitian). ? Take a fiber supplement or probiotic, if your health care provider approves.  Take over-the-counter and prescription medicines only as told by your health care provider.  If you were prescribed an antibiotic, take it as told by your health care provider. Do not stop taking the antibiotic even if you start to feel better.  Keep all follow-up visits as told by your health care provider. This is important. Contact a health care provider if:  You have pain in your abdomen.  You have bloating.  You have cramps.  You have not had a bowel movement in 3 days. Get help right away if:  Your pain gets worse.  Your bloating becomes very bad.  You have a fever or chills, and your symptoms suddenly get worse.  You vomit.  You have bowel movements that are bloody or black.  You have bleeding from your rectum. Summary  Diverticulosis is a condition that develops when small pouches (diverticula) form in the wall of the large intestine (colon).  You may have a few pouches or many of them.  This condition is most often diagnosed during an exam for other colon problems.  If you have had an infection related to diverticulosis, treatment may include increasing the fiber in your diet, taking supplements, or taking medicines. This information is not intended to replace advice given  to you by your health care provider. Make sure you discuss any questions you have with your health care provider. Document Released: 04/21/2004 Document Revised: 06/13/2016 Document Reviewed: 06/13/2016 Elsevier Interactive Patient Education  2019 [employer]. No aspirin or NSAIDs for 24 hours. Resume other medications as before. High-fiber diet. No driving for 24 hours. Physician will call with biopsy results.

## 2019-01-24 NOTE — Op Note (Signed)
[facility] Patient Name: Dillon [name] Procedure Date: 01/24/2019 10:06 AM MRN: [id number] Date of Birth: 1956-02-08 Attending MD: Hildred Laser , MD CSN: [id number] Age: 63 Admit Type: Outpatient Procedure:                Colonoscopy Indications:              Positive Cologuard test Providers:                Hildred Laser, MD, Jeanann Lewandowsky. Sharon Seller, RN, Raphael Gibney, Technician Referring MD:             Elayne Snare. Wolfgang Phoenix, MD Medicines:                Meperidine 75 mg IV, Midazolam 10 mg IV Complications:            No immediate complications. Estimated Blood Loss:     Estimated blood loss was minimal. Procedure:                Pre-Anesthesia Assessment:                           - Prior to the procedure, a History and Physical                            was performed, and patient medications and                            allergies were reviewed. The patient's tolerance of                            previous anesthesia was also reviewed. The risks                            and benefits of the procedure and the sedation                            options and risks were discussed with the patient.                            All questions were answered, and informed consent                            was obtained. Prior Anticoagulants: The patient has                            taken no previous anticoagulant or antiplatelet                            agents except for aspirin. ASA Grade Assessment: I                            - A normal, healthy patient. After reviewing the  risks and benefits, the patient was deemed in                            satisfactory condition to undergo the procedure.                           After obtaining informed consent, the colonoscope                            was passed under direct vision. Throughout the                            procedure, the patient's blood pressure, pulse, and                    oxygen saturations were monitored continuously. The                            [id number]-H190DL ([id number]) scope was introduced through                            the anus and advanced to the the cecum, identified                            by appendiceal orifice and ileocecal valve. The                            colonoscopy was performed without difficulty. The                            patient tolerated the procedure well. The quality                            of the bowel preparation was good. The ileocecal                            valve, appendiceal orifice, and rectum were                            photographed. Scope In: 10:35:56 AM Scope Out: 01:09:32 AM Scope Withdrawal Time: 0 hours 17 minutes 5 seconds  Total Procedure Duration: 0 hours 32 minutes 58 seconds  Findings:      The perianal and digital rectal examinations were normal.      Three sessile polyps were found in the hepatic flexure and ascending       colon. The polyps were small in size. These were biopsied with a cold       forceps for histology. The pathology specimen was placed into Bottle       Number 1.      Two polyps were found in the proximal sigmoid colon and ascending colon.       The polyps were 4 to 6 mm in size. These polyps were removed with a cold       snare. Resection was complete, but the polyp tissue was only partially       retrieved. The pathology specimen was  placed into Bottle Number 1.      A few medium-mouthed diverticula were found in the sigmoid colon.      External hemorrhoids were found during retroflexion. The hemorrhoids       were small.      Anal papilla(e) were hypertrophied. Impression:               - Three small polyps at the hepatic flexure and in                            the ascending colon. Biopsied.                           - Two 4 to 6 mm polyps in the proximal sigmoid                            colon and in the ascending colon, removed with a                             cold snare. Complete resection. Partial retrieval.                           - Diverticulosis in the sigmoid colon.                           - External hemorrhoids.                           - Anal papilla(e) were hypertrophied. Moderate Sedation:      Moderate (conscious) sedation was administered by the endoscopy nurse       and supervised by the endoscopist. The following parameters were       monitored: oxygen saturation, heart rate, blood pressure, CO2       capnography and response to care. Total physician intraservice time was       43 minutes. Recommendation:           - Patient has a contact number available for                            emergencies. The signs and symptoms of potential                            delayed complications were discussed with the                            patient. Return to normal activities tomorrow.                            Written discharge instructions were provided to the                            patient.                           - High fiber diet today.                           -  Continue present medications.                           - No aspirin, ibuprofen, naproxen, or other                            non-steroidal anti-inflammatory drugs for 1 day.                           - Await pathology results.                           - Repeat colonoscopy is recommended. The                            colonoscopy date will be determined after pathology                            results from today's exam become available for                            review. Procedure Code(s):        --- Professional ---                           [phone], Colonoscopy, flexible; with removal of                            tumor(s), polyp(s), or other lesion(s) by snare                            technique                           45380, 59, Colonoscopy, flexible; with biopsy,                            single or multiple                            99153, Moderate sedation; each additional 15                            minutes intraservice time                           99153, Moderate sedation; each additional 15                            minutes intraservice time                           G0500, Moderate sedation services provided by the                            same physician or other qualified health care  professional performing a gastrointestinal                            endoscopic service that sedation supports,                            requiring the presence of an independent trained                            observer to assist in the monitoring of the                            patient's level of consciousness and physiological                            status; initial 15 minutes of intra-service time;                            patient age 59 years or older (additional time may                            be reported with [phone], as appropriate) Diagnosis Code(s):        --- Professional ---                           K63.5, Polyp of colon                           K64.4, Residual hemorrhoidal skin tags                           K62.89, Other specified diseases of anus and rectum                           R19.5, Other fecal abnormalities                           K57.30, Diverticulosis of large intestine without                            perforation or abscess without bleeding CPT copyright 2019 American Medical Association. All rights reserved. The codes documented in this report are preliminary and upon coder review may  be revised to meet current compliance requirements. Hildred Laser, MD Hildred Laser, MD 01/24/2019 11:21:14 AM This report has been signed electronically. Number of Addenda: 0

## 2019-01-29 ENCOUNTER — Encounter (HOSPITAL_COMMUNITY): Payer: Self-pay | Admitting: Internal Medicine

## 2019-03-06 ENCOUNTER — Encounter: Payer: Self-pay | Admitting: Family Medicine

## 2019-09-12 ENCOUNTER — Encounter: Payer: Self-pay | Admitting: Family Medicine

## 2019-11-20 ENCOUNTER — Other Ambulatory Visit: Payer: Self-pay | Admitting: *Deleted

## 2019-11-20 ENCOUNTER — Telehealth: Payer: Self-pay | Admitting: Family Medicine

## 2019-11-20 DIAGNOSIS — R5383 Other fatigue: Secondary | ICD-10-CM

## 2019-11-20 DIAGNOSIS — Z125 Encounter for screening for malignant neoplasm of prostate: Secondary | ICD-10-CM

## 2019-11-20 DIAGNOSIS — K921 Melena: Secondary | ICD-10-CM

## 2019-11-20 DIAGNOSIS — Z13220 Encounter for screening for lipoid disorders: Secondary | ICD-10-CM

## 2019-11-20 DIAGNOSIS — Z79899 Other long term (current) drug therapy: Secondary | ICD-10-CM

## 2019-11-20 NOTE — Telephone Encounter (Signed)
Patient wife notified lab work was ordered.

## 2019-11-20 NOTE — Telephone Encounter (Signed)
Pt has cpe scheduled 5/3 and would like lab work done before appt .

## 2019-11-20 NOTE — Telephone Encounter (Signed)
same

## 2019-11-28 LAB — BASIC METABOLIC PANEL
BUN/Creatinine Ratio: 16 (ref 10–24)
BUN: 12 mg/dL (ref 8–27)
CO2: 22 mmol/L (ref 20–29)
Calcium: 9.2 mg/dL (ref 8.6–10.2)
Chloride: 107 mmol/L — ABNORMAL HIGH (ref 96–106)
Creatinine, Ser: 0.76 mg/dL (ref 0.76–1.27)
GFR calc Af Amer: 112 mL/min/{1.73_m2} (ref >59)
GFR calc non Af Amer: 97 mL/min/{1.73_m2} (ref >59)
Glucose: 95 mg/dL (ref 65–99)
Potassium: 4.2 mmol/L (ref 3.5–5.2)
Sodium: 143 mmol/L (ref 134–144)

## 2019-11-28 LAB — LIPID PANEL
Chol/HDL Ratio: 2.7 ratio (ref 0.0–5.0)
Cholesterol, Total: 164 mg/dL (ref 100–199)
HDL: 60 mg/dL (ref >39)
LDL Chol Calc (NIH): 90 mg/dL (ref 0–99)
Triglycerides: 72 mg/dL (ref 0–149)
VLDL Cholesterol Cal: 14 mg/dL (ref 5–40)

## 2019-11-28 LAB — HEPATIC FUNCTION PANEL
ALT: 18 IU/L (ref 0–44)
AST: 22 IU/L (ref 0–40)
Albumin: 4.3 g/dL (ref 3.8–4.8)
Alkaline Phosphatase: 107 IU/L (ref 39–117)
Bilirubin Total: 0.5 mg/dL (ref 0.0–1.2)
Bilirubin, Direct: 0.14 mg/dL (ref 0.00–0.40)
Total Protein: 6.5 g/dL (ref 6.0–8.5)

## 2019-11-28 LAB — PSA: Prostate Specific Ag, Serum: 2.7 ng/mL (ref 0.0–4.0)

## 2019-12-09 ENCOUNTER — Other Ambulatory Visit: Payer: Self-pay

## 2019-12-09 ENCOUNTER — Encounter: Payer: Self-pay | Admitting: Family Medicine

## 2019-12-09 ENCOUNTER — Ambulatory Visit (INDEPENDENT_AMBULATORY_CARE_PROVIDER_SITE_OTHER): Payer: BC Managed Care – PPO | Admitting: Family Medicine

## 2019-12-09 VITALS — BP 130/78 | Temp 97.0°F | Ht 72.0 in | Wt 232.4 lb

## 2019-12-09 DIAGNOSIS — Z0000 Encounter for general adult medical examination without abnormal findings: Secondary | ICD-10-CM
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
## 2020-01-20 ENCOUNTER — Other Ambulatory Visit (HOSPITAL_COMMUNITY): Payer: Self-pay | Admitting: Sports Medicine

## 2020-01-20 ENCOUNTER — Ambulatory Visit (HOSPITAL_COMMUNITY)
Admission: RE | Admit: 2020-01-20 | Discharge: 2020-01-20 | Disposition: A | Discharge status: Home / Self Care | Payer: BC Managed Care – PPO | Source: Ambulatory Visit | Attending: Family Medicine | Admitting: Family Medicine

## 2020-01-20 ENCOUNTER — Other Ambulatory Visit (HOSPITAL_COMMUNITY): Payer: Self-pay

## 2020-01-20 ENCOUNTER — Other Ambulatory Visit: Payer: Self-pay

## 2020-01-20 DIAGNOSIS — M25551 Pain in right hip: Secondary | ICD-10-CM

## 2020-01-20 DIAGNOSIS — M25552 Pain in left hip: Secondary | ICD-10-CM

## 2020-12-02 ENCOUNTER — Telehealth: Payer: Self-pay | Admitting: Family Medicine

## 2020-12-02 DIAGNOSIS — Z0000 Encounter for general adult medical examination without abnormal findings: Secondary | ICD-10-CM

## 2020-12-02 DIAGNOSIS — Z125 Encounter for screening for malignant neoplasm of prostate: Secondary | ICD-10-CM

## 2020-12-02 DIAGNOSIS — Z13220 Encounter for screening for lipoid disorders: Secondary | ICD-10-CM

## 2020-12-02 NOTE — Telephone Encounter (Signed)
Last labs 11/2019: Lipid, Met 7, Liver, PSA

## 2020-12-02 NOTE — Telephone Encounter (Signed)
Patient has physical 5/10 and needing labs done

## 2020-12-07 ENCOUNTER — Telehealth: Payer: Self-pay | Admitting: Family Medicine

## 2020-12-07 DIAGNOSIS — Z13220 Encounter for screening for lipoid disorders: Secondary | ICD-10-CM

## 2020-12-07 DIAGNOSIS — Z79899 Other long term (current) drug therapy: Secondary | ICD-10-CM

## 2020-12-07 DIAGNOSIS — Z125 Encounter for screening for malignant neoplasm of prostate: Secondary | ICD-10-CM

## 2020-12-07 NOTE — Telephone Encounter (Signed)
And pls add cbc. Thx. Dr. Darene Lamer

## 2020-12-07 NOTE — Telephone Encounter (Signed)
Last labs completed 11/27/19 PSA, Hepatic, Lip and Bmet. Please advise. Thank you

## 2020-12-07 NOTE — Telephone Encounter (Signed)
Lab orders placed and pt is aware 

## 2020-12-07 NOTE — Telephone Encounter (Signed)
Patient needing labs done for physical  on 5/10

## 2020-12-07 NOTE — Telephone Encounter (Signed)
Yes pls order these. Thx. Dr. T  

## 2020-12-07 NOTE — Telephone Encounter (Signed)
Yes, pls order those. Thx. Dr. T  

## 2020-12-08 NOTE — Telephone Encounter (Signed)
Number on message not working. Called home number and left a message for pt to return call. Bw orders are put in

## 2020-12-08 NOTE — Telephone Encounter (Signed)
Pt returned call. Pt has already had lab work done this morning. CBC not on lab orders; will need to call [facility] once they receive blood.

## 2020-12-08 NOTE — Telephone Encounter (Signed)
Wife connie called back but she is not on his release so unable to give info

## 2020-12-09 LAB — LIPID PANEL
Chol/HDL Ratio: 2.8 ratio (ref 0.0–5.0)
Cholesterol, Total: 165 mg/dL (ref 100–199)
HDL: 59 mg/dL (ref >39)
LDL Chol Calc (NIH): 93 mg/dL (ref 0–99)
Triglycerides: 65 mg/dL (ref 0–149)
VLDL Cholesterol Cal: 13 mg/dL (ref 5–40)

## 2020-12-09 LAB — BASIC METABOLIC PANEL
BUN/Creatinine Ratio: 20 (ref 10–24)
BUN: 16 mg/dL (ref 8–27)
CO2: 21 mmol/L (ref 20–29)
Calcium: 9 mg/dL (ref 8.6–10.2)
Chloride: 106 mmol/L (ref 96–106)
Creatinine, Ser: 0.8 mg/dL (ref 0.76–1.27)
Glucose: 102 mg/dL — ABNORMAL HIGH (ref 65–99)
Potassium: 4.4 mmol/L (ref 3.5–5.2)
Sodium: 144 mmol/L (ref 134–144)
eGFR: 99 mL/min/{1.73_m2} (ref >59)

## 2020-12-09 LAB — HEPATIC FUNCTION PANEL
ALT: 15 IU/L (ref 0–44)
AST: 20 IU/L (ref 0–40)
Albumin: 4.4 g/dL (ref 3.8–4.8)
Alkaline Phosphatase: 121 IU/L (ref 44–121)
Bilirubin Total: 0.4 mg/dL (ref 0.0–1.2)
Bilirubin, Direct: 0.13 mg/dL (ref 0.00–0.40)
Total Protein: 6.7 g/dL (ref 6.0–8.5)

## 2020-12-09 LAB — PSA: Prostate Specific Ag, Serum: 2 ng/mL (ref 0.0–4.0)

## 2020-12-11 NOTE — Telephone Encounter (Signed)
I called the lab and they was unable to add cbc.

## 2020-12-15 ENCOUNTER — Ambulatory Visit (INDEPENDENT_AMBULATORY_CARE_PROVIDER_SITE_OTHER): Payer: BC Managed Care – PPO | Admitting: Family Medicine

## 2020-12-15 ENCOUNTER — Other Ambulatory Visit: Payer: Self-pay

## 2020-12-15 VITALS — BP 138/82 | HR 64 | Temp 98.4°F | Ht 72.0 in | Wt 220.8 lb

## 2020-12-15 DIAGNOSIS — M25552 Pain in left hip: Secondary | ICD-10-CM | POA: Diagnosis not present

## 2020-12-15 DIAGNOSIS — Z0000 Encounter for general adult medical examination without abnormal findings: Secondary | ICD-10-CM

## 2020-12-15 DIAGNOSIS — G8929 Other chronic pain: Secondary | ICD-10-CM | POA: Diagnosis not present
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
## 2020-12-16 ENCOUNTER — Telehealth: Payer: Self-pay | Admitting: *Deleted

## 2020-12-16 LAB — CBC
Hematocrit: 38.5 % (ref 37.5–51.0)
Hemoglobin: 13.5 g/dL (ref 13.0–17.7)
MCH: 30.6 pg (ref 26.6–33.0)
MCHC: 35.1 g/dL (ref 31.5–35.7)
MCV: 87 fL (ref 79–97)
Platelets: 195 10*3/uL (ref 150–450)
RBC: 4.41 x10E6/uL (ref 4.14–5.80)
RDW: 12.8 % (ref 11.6–15.4)
WBC: 6 10*3/uL (ref 3.4–10.8)

## 2020-12-16 NOTE — Telephone Encounter (Signed)
[phone] Pt was in yesterday for physical. Thought he just had allergies then today started having congestion and shoulder pain. Took covid test today and it was positive. No sob, no fever.

## 2020-12-16 NOTE — Telephone Encounter (Signed)
Called and discussed with pt. Pt verbalized understanding.  

## 2020-12-16 NOTE — Telephone Encounter (Signed)
Covid 19 recommendations- It's symptomatic tx for viral illness. Take mucinex or delsym for coughing.  Increase fluid intake.  Flonase for nasal congestion. Vit C, vit D and zinc are recommended vitamins to take.  zinc dose of 75 mg-100mg . daily doses of vitamin D3 (1,000-4,000 IU), vitamin C (500 mg), and melatonin (0.3mg -2 mg each night).  Tylenol or Ibuprofen for pain/fever.   If not getting better with all this, call or if after hours go to urgent care.    Take care,   Dr. Lovena Le

## 2021-01-18 ENCOUNTER — Other Ambulatory Visit: Payer: Self-pay | Admitting: Orthopedic Surgery

## 2021-01-18 ENCOUNTER — Other Ambulatory Visit: Payer: Self-pay

## 2021-01-18 ENCOUNTER — Ambulatory Visit (INDEPENDENT_AMBULATORY_CARE_PROVIDER_SITE_OTHER): Payer: BC Managed Care – PPO | Admitting: Orthopedic Surgery

## 2021-01-18 ENCOUNTER — Encounter: Payer: Self-pay | Admitting: Orthopedic Surgery

## 2021-01-18 VITALS — BP 147/96 | HR 64 | Ht 72.0 in | Wt 219.0 lb

## 2021-01-18 DIAGNOSIS — M47816 Spondylosis without myelopathy or radiculopathy, lumbar region: Secondary | ICD-10-CM

## 2021-01-18 DIAGNOSIS — M1612 Unilateral primary osteoarthritis, left hip: Secondary | ICD-10-CM | POA: Diagnosis not present

## 2021-01-18 MED ORDER — MELOXICAM 7.5 MG PO TABS: 7.5000 mg | ORAL_TABLET | Freq: Every day | ORAL | 2 refills | Status: DC

## 2021-01-18 NOTE — Progress Notes (Signed)
  Dillon [name] is a new patient has not been seen in the 3 to 5-year.  Dillon [name] comes in today complaining of pain in his left hip which is getting worse.  He started to have symptoms about a year ago and since that time the pain is worsened.  It is located in the groin and lateral side of the hip and pelvis.  He does have a history of spondylolisthesis he is been evaluated by another orthopedist who specializes in spine problems and there is no intervention needed at this time  He does note some intermittent lateral lower leg pain below the knee which may be related to his L5-S1 spondylolysis/listhesis and disc space narrowing  He is not on any medication other than Aleve every now and then he did have an ankle replacement on the right which did well  History reviewed. No pertinent past medical history. He admits to no medical problems  Past Surgical History:  Procedure Laterality Date   COLONOSCOPY N/A 01/24/2019   Procedure: COLONOSCOPY;  Surgeon: [name], MD;  Location: [facility];  Service: Endoscopy;  Laterality: N/A;  1030   JOINT REPLACEMENT Right    Ankle replacement   KNEE SURGERY     POLYPECTOMY  01/24/2019   Procedure: POLYPECTOMY;  Surgeon: [name], MD;  Location: [facility];  Service: Endoscopy;;  colon   WRIST FRACTURE SURGERY      BP (!) 147/96   Pulse 64   Ht 6' (1.829 m)   Wt 219 lb (99.3 kg)   BMI 29.70 kg/m   Development nutrition grooming hygiene normal sensation intact oriented x3 normal mood affect normal  Gait shows a waddling gait  He has a slight leg length discrepancy on the left he has normal hip flexion and equal hip flexion bilaterally  While his right hip internally rotates about 15 degrees he has 0 degrees on the left this causes pain.  The hip flexion is 125 degrees on both sides with no pain on either side abduction and adduction are pain-free in the total arc is about 50 degrees.  There is no weakness on the left side  X-rays of the  hip and pelvis right and left hip show osteoarthritis on the left near end-stage mild femoral head neck offset decrease on the right  His spine x-rays showed spondylolisthesis of L5-S1 L2-S1 disc space narrowing and facet arthritis L4-S1.  These films were done at [facility] and these are my interpretations reports can be reviewed through the chart  Assessment and plan  Encounter Diagnoses  Name Primary?   Arthritis of left hip Yes   Lumbar spondylosis      The patient has osteoarthritis left hip he is interested in getting something done after his daughter gets married perhaps in January or February  I will see him in 6 months for x-rays I started him on meloxicam 7.5 mg daily

## 2021-01-18 NOTE — Telephone Encounter (Signed)
Mr. Musich called and stated that he thought Dr Aline Brochure was going to send in Meloxicam 75 mgs. For him but this has not been done as of yet.  He uses Personal assistant.  Thanks

## 2021-01-20 ENCOUNTER — Other Ambulatory Visit: Payer: Self-pay | Admitting: *Deleted

## 2021-01-20 MED ORDER — DOXYCYCLINE HYCLATE 100 MG PO TABS: ORAL_TABLET | ORAL | 0 refills | Status: DC

## 2021-01-20 NOTE — Telephone Encounter (Signed)
Called and discussed with pt. Pt verbalized understanding. He did want doxy. Med sent to pharm.

## 2021-01-20 NOTE — Telephone Encounter (Signed)
Nurses I read the patient's message  So in the situations where a person has been bit by deer tick they will need to watch themselves closely over the course of the next 30 days for any signs of tick related illness such as high fevers body aches headaches flulike symptoms(without respiratory) or unusual rashes.  Typically a small red raised area around the tick bite is very common without tick bites and is unlikely to be the sign of any type of infection unless it starts getting significantly bigger.  On a separate note-studies do show that taking doxycycline 100 mg, 2 pills times 1 within the first 2 to 3 days of a significant number of deer tick bites can lessen the risk of Lyme's disease.  If he is interested in this we can send a prescription for doxycycline 100 mg, 2 pills taken at once with a snack and a tall glass of water as preventative for Lyme's.  That is not a mandatory treatment it is just an option for his situation.  Please communicate with patient let him decide what he would like to do  Obviously if he starts having tick related illness symptoms he should be seen here or the ER or urgent care ASAP  Thanks-Dr. Nicki Reaper

## 2021-02-05 NOTE — Telephone Encounter (Signed)
Pt returned call. Pt states he really appreciates [place] calling to check on him. The rash is still there but he is keeping an eye on it. Pt states he was unavailable yesterday and is also tied up today. Pt states no fever, body aches or other issues. Pt will call Tuesday if needing to be seen.

## 2021-02-05 NOTE — Telephone Encounter (Signed)
Left message to return call 

## 2021-04-09 ENCOUNTER — Other Ambulatory Visit: Payer: Self-pay | Admitting: Orthopedic Surgery

## 2021-04-09 IMAGING — DX DG LUMBAR SPINE COMPLETE 4+V
5 series · 5 of 5 positions shown · non-contrast
Comparison: None.

CLINICAL DATA: Pain

EXAM:
LUMBAR SPINE - COMPLETE 4+ VIEW

[l-spine ap]
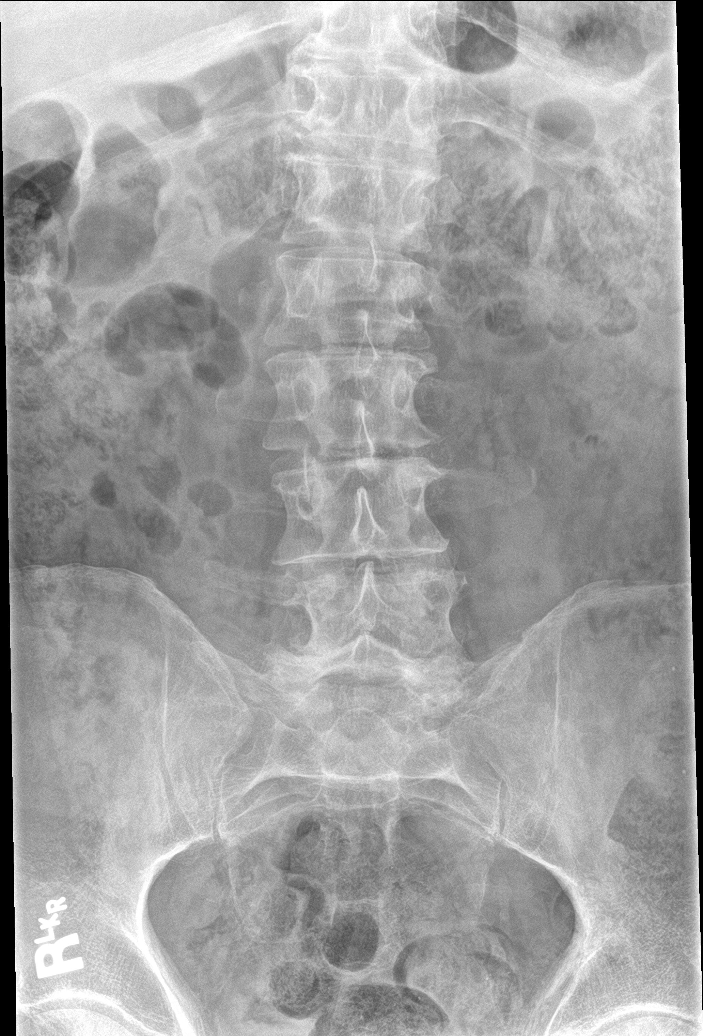

[l-spine obl (1 of 2)]
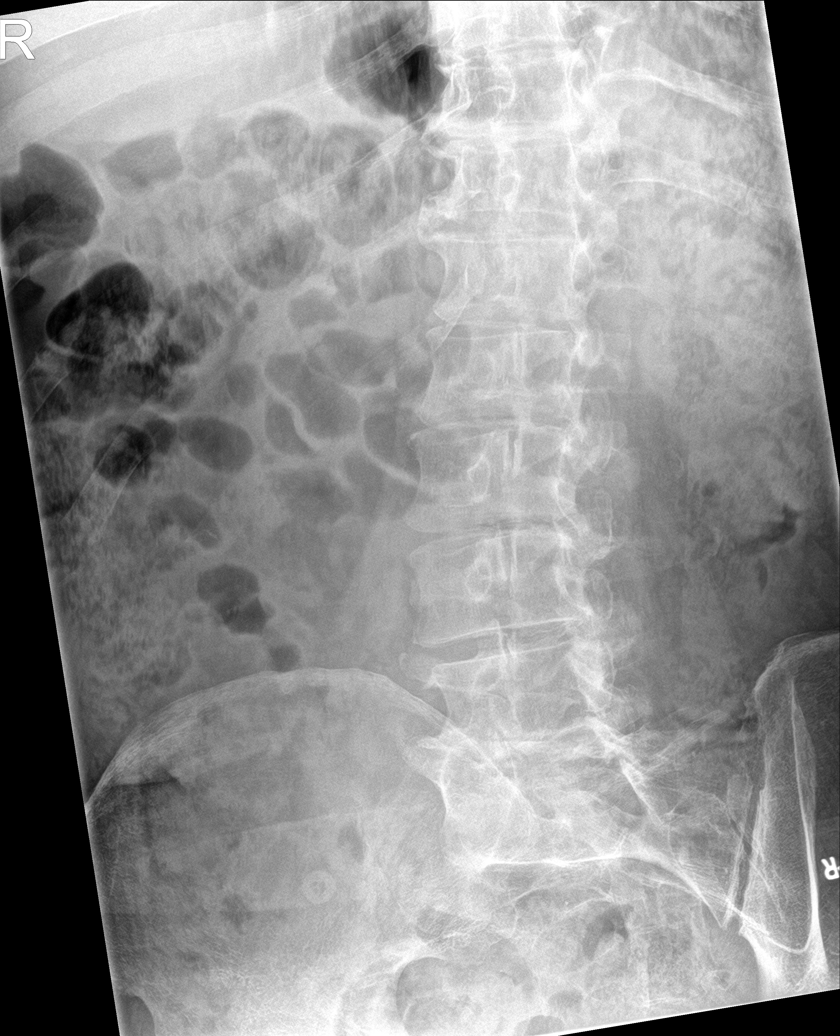

[l-spine obl (2 of 2)]
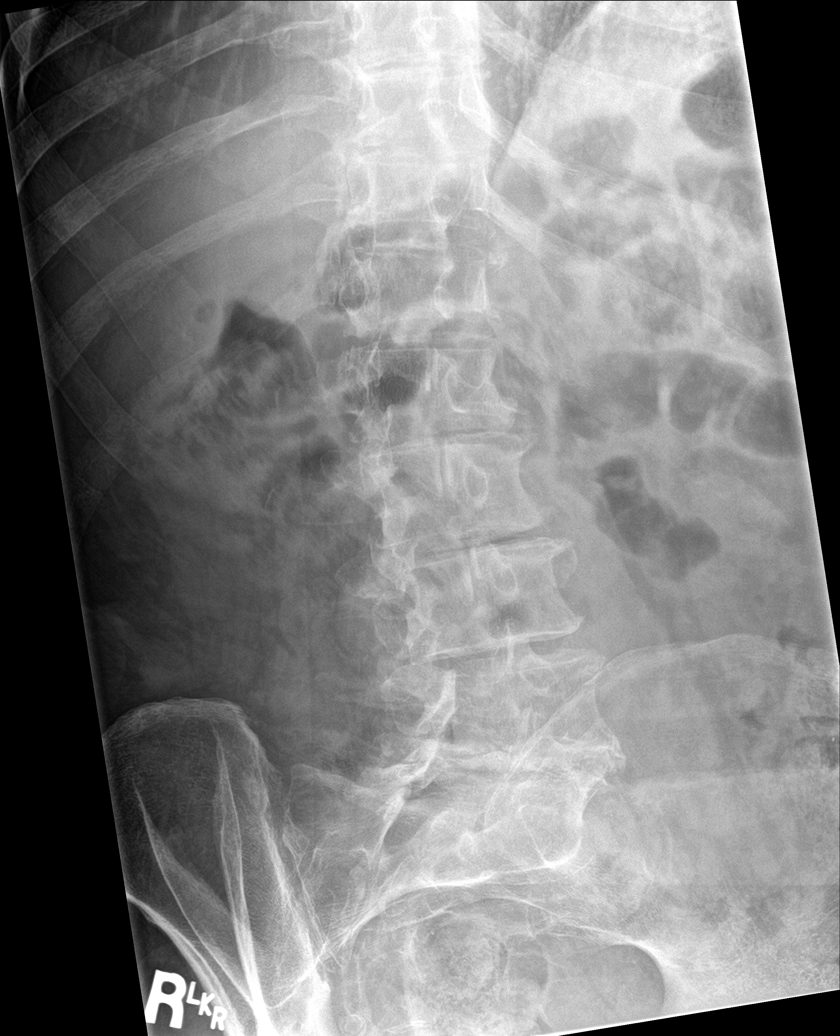

[l-spine lat]
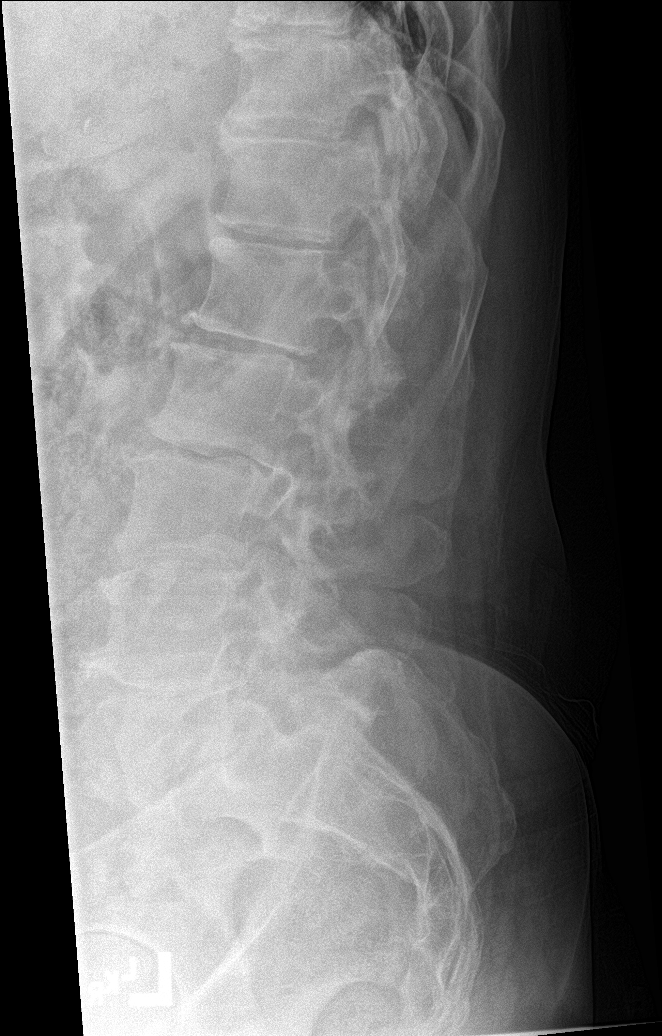

[l-spine spot]
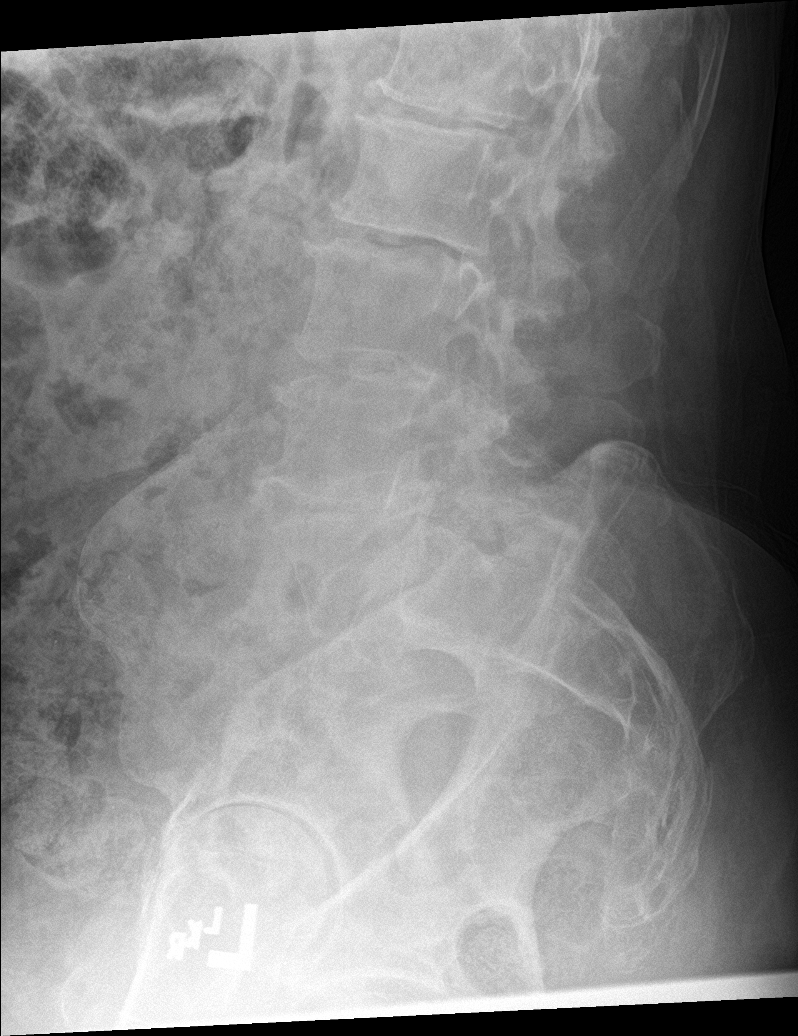

[5 of 5 positions shown; findings below may reference images not displayed]

FINDINGS: Frontal, lateral, spot lumbosacral lateral, and bilateral oblique
views were obtained. There are 5 non-rib-bearing lumbar type
vertebral bodies. There is upper lumbar dextroscoliosis. There is no
appreciable fracture. There are pars defects at L5 bilaterally with
11 mm of anterolisthesis of L5 on S1. No other spondylolisthesis is
evident. There is moderately severe disc space narrowing at L2-3,
L3-4, L4-5, and L5-S1. There is facet osteoarthritic change at L4-5
and L5-S1 bilaterally.
IMPRESSION: Pars defects at L5 bilaterally with 11 mm of anterolisthesis of L5
on S1. No other spondylolisthesis. No fracture.

Multilevel osteoarthritic change as noted. There is a degree of
upper lumbar dextroscoliosis.

## 2021-05-21 ENCOUNTER — Other Ambulatory Visit: Payer: Self-pay | Admitting: Orthopedic Surgery

## 2021-06-24 ENCOUNTER — Other Ambulatory Visit: Payer: Self-pay | Admitting: Orthopedic Surgery

## 2021-07-19 ENCOUNTER — Ambulatory Visit: Payer: BC Managed Care – PPO

## 2021-07-19 ENCOUNTER — Encounter: Payer: Self-pay | Admitting: Orthopedic Surgery

## 2021-07-19 ENCOUNTER — Ambulatory Visit: Payer: BC Managed Care – PPO | Admitting: Orthopedic Surgery

## 2021-07-19 ENCOUNTER — Other Ambulatory Visit: Payer: Self-pay

## 2021-07-19 DIAGNOSIS — M1612 Unilateral primary osteoarthritis, left hip: Secondary | ICD-10-CM

## 2021-07-19 MED ORDER — MELOXICAM 7.5 MG PO TABS: 7.5000 mg | ORAL_TABLET | Freq: Two times a day (BID) | ORAL | 0 refills | Status: DC

## 2021-07-19 MED ORDER — BUPIVACAINE-MELOXICAM ER 200-6 MG/7ML IJ SOLN: 400.0000 mg | Freq: Once | INTRAMUSCULAR | Status: DC

## 2021-07-19 NOTE — Progress Notes (Signed)
Dillon [name]  07/19/2021    ASSESSMENT AND PLAN:     Encounter Diagnosis  Name Primary?   Arthritis of left hip Yes      The procedure has been fully reviewed with the patient; The risks and benefits of surgery have been discussed and explained and understood. Alternative treatment has also been reviewed, questions were encouraged and answered. The postoperative plan is also been reviewed.  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 with chronic pain in the left hip for several years  He also had a right total ankle replacement years ago  Comes in today for preop for left total hip arthroplasty  He is agreed to direct lateral approach  His complaints include pain in his left hip and thigh fairly significant left leg length discrepancy although he says he has had trouble with his gait for quite a period of time  Certain things as expected are difficult for him including getting dressed and putting on his shoes  He is on Mobic and Tylenol arthritis  He also uses a walking stick  He has had 2 bouts of MRSA     HISTORY SECTION :   Review of Systems  Musculoskeletal:  Positive for joint pain.       Right knee pain    has no past medical history on file.   Past Surgical History:  Procedure Laterality Date   COLONOSCOPY N/A 01/24/2019   Procedure: COLONOSCOPY;  Surgeon: Dillon Houston, MD;  Location: [facility];  Service: Endoscopy;  Laterality:  N/A;  1030   JOINT REPLACEMENT Right    Ankle replacement   KNEE SURGERY     POLYPECTOMY  01/24/2019   Procedure: POLYPECTOMY;  Surgeon: Dillon Houston, MD;  Location: [facility];  Service: Endoscopy;;  colon   WRIST FRACTURE SURGERY      Social History   Socioeconomic History   Marital status: Married    Spouse name: Not on file   Number of children: Not on file   Years of education: Not on file   Highest education level: Not on file  Occupational History   Not on file  Tobacco Use   Smoking status: Never   Smokeless tobacco: Never  Substance and Sexual Activity   Alcohol use: Yes   Drug use: No   Sexual activity: Not on file  Other Topics Concern   Not on file  Social History Narrative   Not on file   Social Determinants of Health   Financial Resource Strain: Not on file  Food Insecurity: Not on file  Transportation Needs: Not on file  Physical Activity: Not on file  Stress: Not on file  Social Connections: Not on file  Intimate Partner Violence: Not on file     Family History  Problem Relation Age of Onset   Cancer  Father       No Known Allergies   Current Outpatient Medications:    aspirin 81 MG chewable tablet, Chew 81 mg by mouth daily. , Disp: , Rfl:    cetirizine (ZYRTEC) 10 MG tablet, Take 10 mg by mouth at bedtime., Disp: , Rfl:    diazepam (VALIUM) 5 MG tablet, Take 5 mg by mouth every 6 (six) hours as needed., Disp: , Rfl:    ELDERBERRY PO, Take 1 tablet by mouth daily., Disp: , Rfl:    Multiple Vitamin (MULTIVITAMIN WITH MINERALS) TABS tablet, Take 1 tablet by mouth daily., Disp: , Rfl:    Omega-3 Fatty Acids (FISH OIL PO), Take 1 capsule by mouth daily., Disp: , Rfl:    Probiotic Product (PROBIOTIC PO), Take 1 tablet by mouth daily., Disp: , Rfl:    Specialty Vitamins Products (MAGNESIUM, AMINO ACID CHELATE,) 133 MG tablet, Take 1 tablet by mouth., Disp: , Rfl:    TURMERIC PO, Take 1 tablet by mouth daily., Disp: , Rfl:    meloxicam  (MOBIC) 7.5 MG tablet, Take 1 tablet (7.5 mg total) by mouth in the morning and at bedtime., Disp: 60 tablet, Rfl: 0  Current Facility-Administered Medications:    bupivacaine-meloxicam ER (ZYNRELEF) injection 400 mg, 400 mg, Infiltration, Once, Dillon Civil, MD   PHYSICAL EXAM SECTION: There were no vitals taken for this visit.  There is no height or weight on file to calculate BMI.   General appearance: Well-developed well-nourished no gross deformities  Eyes clear normal vision no evidence of conjunctivitis or jaundice, extraocular muscles intact  ENT: ears hearing normal, nasal passages clear, throat clear   Neck is supple without palpable mass, full range of motion   Cardiovascular normal pulse and perfusion in all 4 extremities with some hyperemia in his pretibial areas  Lymph nodes: No lymphadenopathy  Neurologically deep tendon reflexes are equal and normal, no sensation loss or deficits no pathologic reflexes   Skin no lacerations or ulcerations no nodularity no palpable masses, no erythema or nodularity  Psychological: Awake alert and oriented x3 mood and affect normal  Musculoskeletal: He has a leg length discrepancy when he supine with the left being shorter than the right this is confirmed with a block test of about half inch and on x-ray with about 17 mm of discrepancy  Hip flexion 90 degrees abduction 30 degrees adduction 20 degrees  Internal rotation to neutral in the supine position with the knee straight  Imaging: I personally read the images and my interpretation is severe arthritis left hip with collapse of the femoral head cysts and sclerosis on both sides of the joint with destruction of the joint as well leg length discrepancy measured 17 mm  2:53 PM  Dillon [name]

## 2021-07-19 NOTE — Patient Instructions (Addendum)
Your surgery will be at [facility] by Dr Aline Brochure expect to be in the hospital overnight. The hospital will contact you with a preoperative appointment to discuss Anesthesia. Please bring your medications with you for the appointment. They will tell you the arrival time and medication instructions when you have your preoperative evaluation. Do not wear nail polish the day of your surgery and if you take Phentermine you need to stop this medication ONE WEEK prior to your surgery.    You will have home physical therapy for 2 weeks after surgery, the home health agency will call you before or just following the surgery to set up visits. [facility] is the agency we normally use, unless you request another agency.   You will get a call also from outpatient therapy for therapy starting when the home therapy is done.  If you have questions or need to Reschedule the surgery, call the office ask for Kratos Ruscitti.  [place] [phone]

## 2021-07-27 ENCOUNTER — Ambulatory Visit: Payer: BC Managed Care – PPO | Admitting: Family Medicine

## 2021-07-27 ENCOUNTER — Other Ambulatory Visit: Payer: Self-pay

## 2021-07-27 VITALS — BP 171/87 | HR 77 | Temp 98.6°F | Ht 72.0 in | Wt 219.0 lb

## 2021-07-27 DIAGNOSIS — R030 Elevated blood-pressure reading, without diagnosis of hypertension: Secondary | ICD-10-CM | POA: Diagnosis not present

## 2021-07-27 DIAGNOSIS — K5909 Other constipation: Secondary | ICD-10-CM | POA: Insufficient documentation

## 2021-07-27 NOTE — Patient Instructions (Signed)
Miralax 1 cap once or twice daily.  Colace after surgery as well.  Keep an eye on your BP.  Take care  Dr. Lacinda Axon

## 2021-07-27 NOTE — Progress Notes (Signed)
Subjective:  Patient ID: Dillon [name], male    DOB: 09-24-1955  Age: 65 y.o. MRN: [id number]  CC: Chief Complaint  Patient presents with   chronic constipation     Took miralax and prune juice which helped  Made recent dietary changes - eliminated sugars, and does intermittent fasting     HPI:  65 year old male presents for evaluation of the above.  Patient states that he has had a longstanding issue with constipation.  More problematic as of late.  He states that he has a regular bowel movement but has to strain a significant amount to produce a bowel movement.  Recently has tried MiraLAX and prune juice with improvement.  He is concerned as he has upcoming surgery.  He wants to make sure that this is not an issue after hip replacement.  Additionally, blood pressure markedly elevated today.  This is abnormal for him.  Patient Active Problem List   Diagnosis Date Noted   Chronic constipation 07/27/2021   Elevated BP without diagnosis of hypertension 07/27/2021   OA (osteoarthritis) of ankle 09/26/2012   Pain in joint, ankle and foot 09/26/2012    Social Hx   Social History   Socioeconomic History   Marital status: Married    Spouse name: Not on file   Number of children: Not on file   Years of education: Not on file   Highest education level: Not on file  Occupational History   Not on file  Tobacco Use   Smoking status: Never   Smokeless tobacco: Never  Substance and Sexual Activity   Alcohol use: Yes   Drug use: No   Sexual activity: Not on file  Other Topics Concern   Not on file  Social History Narrative   Not on file   Social Determinants of Health   Financial Resource Strain: Not on file  Food Insecurity: Not on file  Transportation Needs: Not on file  Physical Activity: Not on file  Stress: Not on file  Social Connections: Not on file    Review of Systems Per HPI  Objective:  BP (!) 171/87    Pulse 77    Temp 98.6 F (37 C)    Ht 6' (1.829  m)    Wt 219 lb (99.3 kg)    SpO2 97%    BMI 29.70 kg/m   BP/Weight 07/27/2021 01/18/2021 9/47/6546  Systolic BP 503 546 568  Diastolic BP 87 96 82  Wt. (Lbs) 219 219 220.8  BMI 29.7 29.7 29.95    Physical Exam Constitutional:      General: He is not in acute distress.    Appearance: Normal appearance. He is not ill-appearing.  Cardiovascular:     Rate and Rhythm: Normal rate and regular rhythm.  Pulmonary:     Effort: Pulmonary effort is normal.     Breath sounds: Normal breath sounds. No wheezing or rales.  Abdominal:     General: There is no distension.     Palpations: Abdomen is soft.     Tenderness: There is no abdominal tenderness.  Neurological:     Mental Status: He is alert.  Psychiatric:        Mood and Affect: Mood normal.        Behavior: Behavior normal.    Lab Results  Component Value Date   WBC 6.0 12/15/2020   HGB 13.5 12/15/2020   HCT 38.5 12/15/2020   PLT 195 12/15/2020   GLUCOSE 102 (H) 12/08/2020  CHOL 165 12/08/2020   TRIG 65 12/08/2020   HDL 59 12/08/2020   LDLCALC 93 12/08/2020   ALT 15 12/08/2020   AST 20 12/08/2020   NA 144 12/08/2020   K 4.4 12/08/2020   CL 106 12/08/2020   CREATININE 0.80 12/08/2020   BUN 16 12/08/2020   CO2 21 12/08/2020     Assessment & Plan:   Problem List Items Addressed This Visit       Digestive   Chronic constipation - Primary    Advised use of MiraLAX.  Colace following surgery especially if he is going to be taking narcotic pain medication.        Other   Elevated BP without diagnosis of hypertension    BP elevated today.  Isolated reading.  Need additional readings at home.  Patient will get a blood pressure cuff and check his blood pressure at home.  He will report back.      Follow-up:  Will check BP's at home and report back  [place]

## 2021-07-27 NOTE — Assessment & Plan Note (Signed)
BP elevated today.  Isolated reading.  Need additional readings at home.  Patient will get a blood pressure cuff and check his blood pressure at home.  He will report back.

## 2021-07-27 NOTE — Assessment & Plan Note (Addendum)
Advised use of MiraLAX.  Colace following surgery especially if he is going to be taking narcotic pain medication.

## 2021-07-28 ENCOUNTER — Encounter: Payer: Self-pay | Admitting: Family Medicine

## 2021-07-30 ENCOUNTER — Telehealth: Payer: Self-pay | Admitting: *Deleted

## 2021-07-30 MED ORDER — AMLODIPINE BESYLATE 2.5 MG PO TABS: 2.5000 mg | ORAL_TABLET | Freq: Every day | ORAL | 1 refills | Status: DC

## 2021-07-30 NOTE — Telephone Encounter (Signed)
Patient notified of provider's recommendations and verbalized understanding. Prescription sent electronically to pharmacy.

## 2021-07-30 NOTE — Telephone Encounter (Signed)
Patient called and stated he saw Dr Lacinda Axon on Tuesday and was advised to watch his blood pressure- he is not currently on BP medication. Patient states his blood pressure has been elevated the last 2 days. Today 170/90 and yesterday 162/86 and Tuesday it was 140/70  Please advise

## 2021-07-30 NOTE — Telephone Encounter (Signed)
Recommend amlodipine 2.5 mg 1 daily, #30, 1 refill, follow-up with Dr. Lacinda Axon within the next 3 to 6 weeks, medication should bring numbers down to a degree, if having significantly elevated blood pressures let us know and follow-up sooner with Dr. Lacinda Axon or one of the other providers, healthy diet vegetables fruits lean meats minimize salt use, walking for activity as tolerated

## 2021-08-12 ENCOUNTER — Other Ambulatory Visit: Payer: Self-pay | Admitting: Orthopedic Surgery

## 2021-08-12 DIAGNOSIS — M1612 Unilateral primary osteoarthritis, left hip: Secondary | ICD-10-CM

## 2021-08-17 ENCOUNTER — Encounter (HOSPITAL_COMMUNITY): Payer: Self-pay

## 2021-08-17 ENCOUNTER — Telehealth: Payer: Self-pay | Admitting: Orthopedic Surgery

## 2021-08-17 NOTE — Telephone Encounter (Signed)
Patient left a message with question about medication prior to surgery  - please advise.

## 2021-08-17 NOTE — Telephone Encounter (Signed)
Left message for him to call back.  

## 2021-08-17 NOTE — Telephone Encounter (Signed)
He has questions about what meds to stop before surgery I advised him when he goes for the preop appointment they will let him know He voiced understanding

## 2021-08-17 NOTE — Patient Instructions (Addendum)
Your procedure is scheduled on: 08/24/2021  Report to [facility] at   6:15  AM.  Call this number if you have problems the morning of surgery: [phone]   Remember:   Do not Eat after midnight.        You may have clear liquids until 3:30 am.       Drink the Ensure high Carb drink at 3:30 am.        No Smoking the morning of surgery  :  Take these medicines the morning of surgery with A SIP OF WATER: Amlodipine, Zyrtec and meloxicam   Do not wear jewelry, make-up or nail polish.  Do not wear lotions, powders, or perfumes. You may wear deodorant.  Do not shave 48 hours prior to surgery. Men may shave face and neck.  Do not bring valuables to the hospital.  Contacts, dentures or bridgework may not be worn into surgery.  Leave suitcase in the car. After surgery it may be brought to your room.  For patients admitted to the hospital, checkout time is 11:00 AM the day of discharge.   Patients discharged the day of surgery will not be allowed to drive home.    Special Instructions: Shower using CHG night before surgery and shower the day of surgery use CHG.  Use special wash - you have one bottle of CHG for all showers.  You should use approximately 1/2 of the bottle for each shower.  How to Use Chlorhexidine for Bathing Chlorhexidine gluconate (CHG) is a germ-killing (antiseptic) solution that is used to clean the skin. It can get rid of the bacteria that normally live on the skin and can keep them away for about 24 hours. To clean your skin with CHG, you may be given: A CHG solution to use in the shower or as part of a sponge bath. A prepackaged cloth that contains CHG. Cleaning your skin with CHG may help lower the risk for infection: While you are staying in the intensive care unit of the hospital. If you have a vascular access, such as a central line, to provide short-term or long-term access to your veins. If you have a catheter to drain urine from your bladder. If  you are on a ventilator. A ventilator is a machine that helps you breathe by moving air in and out of your lungs. After surgery. What are the risks? Risks of using CHG include: A skin reaction. Hearing loss, if CHG gets in your ears and you have a perforated eardrum. Eye injury, if CHG gets in your eyes and is not rinsed out. The CHG product catching fire. Make sure that you avoid smoking and flames after applying CHG to your skin. Do not use CHG: If you have a chlorhexidine allergy or have previously reacted to chlorhexidine. On babies younger than 34 months of age. How to use CHG solution Use CHG only as told by your health care provider, and follow the instructions on the label. Use the full amount of CHG as directed. Usually, this is one bottle. During a shower Follow these steps when using CHG solution during a shower (unless your health care provider gives you different instructions): Start the shower. Use your normal soap and shampoo to wash your face and hair. Turn off the shower or move out of the shower stream. Pour the CHG onto a clean washcloth. Do not use any type of brush or rough-edged sponge. Starting at your neck, lather your body down to  your toes. Make sure you follow these instructions: If you will be having surgery, pay special attention to the part of your body where you will be having surgery. Scrub this area for at least 1 minute. Do not use CHG on your head or face. If the solution gets into your ears or eyes, rinse them well with water. Avoid your genital area. Avoid any areas of skin that have broken skin, cuts, or scrapes. Scrub your back and under your arms. Make sure to wash skin folds. Let the lather sit on your skin for 1-2 minutes or as long as told by your health care provider. Thoroughly rinse your entire body in the shower. Make sure that all body creases and crevices are rinsed well. Dry off with a clean towel. Do not put any substances on your body  afterward--such as powder, lotion, or perfume--unless you are told to do so by your health care provider. Only use lotions that are recommended by the manufacturer. Put on clean clothes or pajamas. If it is the night before your surgery, sleep in clean sheets.  During a sponge bath Follow these steps when using CHG solution during a sponge bath (unless your health care provider gives you different instructions): Use your normal soap and shampoo to wash your face and hair. Pour the CHG onto a clean washcloth. Starting at your neck, lather your body down to your toes. Make sure you follow these instructions: If you will be having surgery, pay special attention to the part of your body where you will be having surgery. Scrub this area for at least 1 minute. Do not use CHG on your head or face. If the solution gets into your ears or eyes, rinse them well with water. Avoid your genital area. Avoid any areas of skin that have broken skin, cuts, or scrapes. Scrub your back and under your arms. Make sure to wash skin folds. Let the lather sit on your skin for 1-2 minutes or as long as told by your health care provider. Using a different clean, wet washcloth, thoroughly rinse your entire body. Make sure that all body creases and crevices are rinsed well. Dry off with a clean towel. Do not put any substances on your body afterward--such as powder, lotion, or perfume--unless you are told to do so by your health care provider. Only use lotions that are recommended by the manufacturer. Put on clean clothes or pajamas. If it is the night before your surgery, sleep in clean sheets. How to use CHG prepackaged cloths Only use CHG cloths as told by your health care provider, and follow the instructions on the label. Use the CHG cloth on clean, dry skin. Do not use the CHG cloth on your head or face unless your health care provider tells you to. When washing with the CHG cloth: Avoid your genital area. Avoid  any areas of skin that have broken skin, cuts, or scrapes. Before surgery Follow these steps when using a CHG cloth to clean before surgery (unless your health care provider gives you different instructions): Using the CHG cloth, vigorously scrub the part of your body where you will be having surgery. Scrub using a back-and-forth motion for 3 minutes. The area on your body should be completely wet with CHG when you are done scrubbing. Do not rinse. Discard the cloth and let the area air-dry. Do not put any substances on the area afterward, such as powder, lotion, or perfume. Put on clean clothes or pajamas. If  it is the night before your surgery, sleep in clean sheets.  For general bathing Follow these steps when using CHG cloths for general bathing (unless your health care provider gives you different instructions). Use a separate CHG cloth for each area of your body. Make sure you wash between any folds of skin and between your fingers and toes. Wash your body in the following order, switching to a new cloth after each step: The front of your neck, shoulders, and chest. Both of your arms, under your arms, and your hands. Your stomach and groin area, avoiding the genitals. Your right leg and foot. Your left leg and foot. The back of your neck, your back, and your buttocks. Do not rinse. Discard the cloth and let the area air-dry. Do not put any substances on your body afterward--such as powder, lotion, or perfume--unless you are told to do so by your health care provider. Only use lotions that are recommended by the manufacturer. Put on clean clothes or pajamas. Contact a health care provider if: Your skin gets irritated after scrubbing. You have questions about using your solution or cloth. You swallow any chlorhexidine. Call your local poison control center (1-540-375-5839 in the [place] Get help right away if: Your eyes itch badly, or they become very red or swollen. Your skin itches  badly and is red or swollen. Your hearing changes. You have trouble seeing. You have swelling or tingling in your mouth or throat. You have trouble breathing. These symptoms may represent a serious problem that is an emergency. Do not wait to see if the symptoms will go away. Get medical help right away. Call your local emergency services (911 in the [place] Do not drive yourself to the hospital. Summary Chlorhexidine gluconate (CHG) is a germ-killing (antiseptic) solution that is used to clean the skin. Cleaning your skin with CHG may help to lower your risk for infection. You may be given CHG to use for bathing. It may be in a bottle or in a prepackaged cloth to use on your skin. Carefully follow your health care provider's instructions and the instructions on the product label. Do not use CHG if you have a chlorhexidine allergy. Contact your health care provider if your skin gets irritated after scrubbing. This information is not intended to replace advice given to you by your health care provider. Make sure you discuss any questions you have with your health care provider. Document Revised: 10/05/2020 Document Reviewed: 10/05/2020 Elsevier Patient Education  2022 [place]. Total Hip Replacement, Posterior, Care After This sheet gives you information about how to care for yourself after your procedure. Your doctor may also give you more specific instructions. If you have problems or questions, contact your doctor. What can I expect after the procedure? Pain. Redness and swelling. Stiffness. Discomfort. A small amount of blood coming from your cut from surgery (incision). A small amount of clear fluid coming from your incision. Follow these instructions at home: Medicines Take over-the-counter and prescription medicines only as told by your doctor. Take a medicine to thin your blood (anticoagulant) as told, if your doctor prescribed one. If told, take steps to prevent problems with  pooping (constipation). You may need to: Drink enough fluid to keep your pee (urine) pale yellow. Take medicines. You will be told what medicines to take. Eat foods that are high in fiber. These include beans, whole grains, and fresh fruits and vegetables. Limit foods that are high in fat and sugar. These include fried or sweet  foods. Ask your doctor if you should avoid driving or using machines while you are taking your medicine. Incision care  Follow instructions from your doctor about how to take care of your incision. Make sure you: Wash your hands with soap and water for at least 20 seconds before and after you change your bandage (dressing). If you cannot use soap and water, use hand sanitizer. Change your bandage as told by your doctor. Leave stitches (sutures), staples, skin glue, or skin tape (adhesive) strips in place. They may need to stay in place for 2 weeks or longer. If tape strips get loose and curl up, you may trim the loose edges. Do not take off tape strips completely unless your doctor tells you to do that. Do not take baths, swim, or use a hot tub until your doctor says it is okay. Check your incision every day for signs of infection. Check for: More redness, swelling, or pain. More fluid or blood. Warmth. Pus or a bad smell. Managing pain, stiffness, and swelling  If told, put ice on the hip area. To do this: Put ice in a plastic bag. Place a towel between your skin and the bag. Leave the ice on for 20 minutes, 2-3 times a day. Take off the ice if your skin turns bright red. This is very important. If you cannot feel pain, heat, or cold, you have a greater risk of damage to the area. Move your toes often. Raise your leg above the level of your heart while you are lying down. Activity Ask your doctor what activities are safe for you. Get up to take short walks every 1 to 2 hours. Ask for help if you feel weak or unsteady. Do exercises as told by your doctor. Do not  use your legs to support (bear) your body weight until your doctor says that you can. Follow instructions about how much weight you may safely support on your injured leg (weight-bearing restrictions). Use a walker, crutches, or a cane as told by your doctor. Return to your normal activities when your doctor says that it is safe. Movement restrictions  Follow instructions from your doctor about how much you should let your hip move (movement restrictions). For example: Do not cross your legs at the knees. Try keeping a pillow between your legs when you lie in bed. Do not bend farther than 90 degrees at your hip and waist. To keep from bending this far: Do not bring your knees higher than your hips. Do not pick up something from the floor while sitting in a chair. Avoid sitting in low chairs. Use a raised toilet seat. When you start to stand up after sitting, keep your injured leg out in front of you. Avoid twisting at your waist and reaching across your body to the side of your injured leg. Avoid turning your toes inward. When getting into a car: Raise the seat as high as you can. Move the seat as far back as it will go. Recline the upper part of the seat slightly. Sit down onto the seat with your injured leg sticking out of the car. Scoot back in the seat as you move the lower half of your body into the car. Try to avoid bumping your foot or leg as you bring it into the car. Safety Keep floors clear of things you may trip over. Place items that you may need within easy reach. Wear an apron or tool belt with pockets for carrying things. General  instructions Ask your doctor when it is safe to drive. Wear compression stockings as told by your doctor. Keep doing breathing exercises as told. Do not smoke or use any products that contain nicotine or tobacco. If you need help quitting, ask your doctor. If you plan to visit a dentist: Tell your doctor. Ask about things to do before your teeth  are cleaned. Tell your dentist about your new joint. Keep all follow-up visits. Contact a doctor if: You have a fever or chills. You have a cough. Your medicine does not help your pain. You have any of these signs of infection in your incision: More redness, swelling, or pain. More fluid or blood. Warmth. Pus or a bad smell. Get help right away if: You have very bad pain. You feel short of breath. You have trouble breathing. You have chest pain. You have redness, swelling, pain, or warmth in your leg or in the back of your lower leg (calf). Your incision opens up after stitches or staples are taken out. These symptoms may be an emergency. Get help right away. Call your local emergency services (911 in the [place] Do not wait to see if the symptoms will go away. Do not drive yourself to the hospital. Summary Do not use your legs to support your body weight until your doctor says that you can. Use a walker, crutches, or a cane as told. Follow instructions from your doctor about how much you can let your hip move. Keep all follow-up visits. This information is not intended to replace advice given to you by your health care provider. Make sure you discuss any questions you have with your health care provider. Document Revised: 12/19/2019 Document Reviewed: 12/19/2019 Elsevier Patient Education  [place] Anesthesia, Adult, Care After This sheet gives you information about how to care for yourself after your procedure. Your health care provider may also give you more specific instructions. If you have problems or questions, contact your health care provider. What can I expect after the procedure? After the procedure, the following side effects are common: Pain or discomfort at the IV site. Nausea. Vomiting. Sore throat. Trouble concentrating. Feeling cold or chills. Feeling weak or tired. Sleepiness and fatigue. Soreness and body aches. These side effects can  affect parts of the body that were not involved in surgery. Follow these instructions at home: For the time period you were told by your health care provider:  Rest. Do not participate in activities where you could fall or become injured. Do not drive or use machinery. Do not drink alcohol. Do not take sleeping pills or medicines that cause drowsiness. Do not make important decisions or sign legal documents. Do not take care of children on your own. Eating and drinking Follow any instructions from your health care provider about eating or drinking restrictions. When you feel hungry, start by eating small amounts of foods that are soft and easy to digest (bland), such as toast. Gradually return to your regular diet. Drink enough fluid to keep your urine pale yellow. If you vomit, rehydrate by drinking water, juice, or clear broth. General instructions If you have sleep apnea, surgery and certain medicines can increase your risk for breathing problems. Follow instructions from your health care provider about wearing your sleep device: Anytime you are sleeping, including during daytime naps. While taking prescription pain medicines, sleeping medicines, or medicines that make you drowsy. Have a responsible adult stay with you for the time you are told. It is  important to have someone help care for you until you are awake and alert. Return to your normal activities as told by your health care provider. Ask your health care provider what activities are safe for you. Take over-the-counter and prescription medicines only as told by your health care provider. If you smoke, do not smoke without supervision. Keep all follow-up visits as told by your health care provider. This is important. Contact a health care provider if: You have nausea or vomiting that does not get better with medicine. You cannot eat or drink without vomiting. You have pain that does not get better with medicine. You are  unable to pass urine. You develop a skin rash. You have a fever. You have redness around your IV site that gets worse. Get help right away if: You have difficulty breathing. You have chest pain. You have blood in your urine or stool, or you vomit blood. Summary After the procedure, it is common to have a sore throat or nausea. It is also common to feel tired. Have a responsible adult stay with you for the time you are told. It is important to have someone help care for you until you are awake and alert. When you feel hungry, start by eating small amounts of foods that are soft and easy to digest (bland), such as toast. Gradually return to your regular diet. Drink enough fluid to keep your urine pale yellow. Return to your normal activities as told by your health care provider. Ask your health care provider what activities are safe for you. This information is not intended to replace advice given to you by your health care provider. Make sure you discuss any questions you have with your health care provider. Document Revised: 04/09/2020 Document Reviewed: 11/07/2019 Elsevier Patient Education  2022 [employer].

## 2021-08-20 ENCOUNTER — Encounter (HOSPITAL_COMMUNITY): Payer: Self-pay

## 2021-08-20 ENCOUNTER — Encounter (HOSPITAL_COMMUNITY)
Admission: RE | Admit: 2021-08-20 | Discharge: 2021-08-20 | Disposition: A | Discharge status: Home / Self Care | Payer: BC Managed Care – PPO | Source: Ambulatory Visit | Attending: Orthopedic Surgery | Admitting: Orthopedic Surgery

## 2021-08-20 ENCOUNTER — Other Ambulatory Visit (HOSPITAL_COMMUNITY)
Admission: RE | Admit: 2021-08-20 | Discharge: 2021-08-20 | Disposition: A | Discharge status: Home / Self Care | Payer: BC Managed Care – PPO | Source: Ambulatory Visit | Attending: Orthopedic Surgery | Admitting: Orthopedic Surgery

## 2021-08-20 ENCOUNTER — Other Ambulatory Visit: Payer: Self-pay

## 2021-08-20 DIAGNOSIS — M1612 Unilateral primary osteoarthritis, left hip: Secondary | ICD-10-CM | POA: Diagnosis not present

## 2021-08-20 DIAGNOSIS — Z20822 Contact with and (suspected) exposure to covid-19: Secondary | ICD-10-CM | POA: Diagnosis not present

## 2021-08-20 DIAGNOSIS — Z01812 Encounter for preprocedural laboratory examination: Secondary | ICD-10-CM | POA: Insufficient documentation

## 2021-08-20 DIAGNOSIS — Z01818 Encounter for other preprocedural examination: Secondary | ICD-10-CM | POA: Insufficient documentation

## 2021-08-20 HISTORY — DX: Spondylosis without myelopathy or radiculopathy, lumbar region: M47.816

## 2021-08-20 HISTORY — DX: Essential (primary) hypertension: I10

## 2021-08-20 LAB — PREPARE RBC (CROSSMATCH)

## 2021-08-20 LAB — CBC WITH DIFFERENTIAL/PLATELET
Abs Immature Granulocytes: 0.02 10*3/uL (ref 0.00–0.07)
Basophils Absolute: 0 10*3/uL (ref 0.0–0.1)
Basophils Relative: 1 %
Eosinophils Absolute: 0.2 10*3/uL (ref 0.0–0.5)
Eosinophils Relative: 4 %
HCT: 41.9 % (ref 39.0–52.0)
Hemoglobin: 14.2 g/dL (ref 13.0–17.0)
Immature Granulocytes: 0 %
Lymphocytes Relative: 25 %
Lymphs Abs: 1.2 10*3/uL (ref 0.7–4.0)
MCH: 31.6 pg (ref 26.0–34.0)
MCHC: 33.9 g/dL (ref 30.0–36.0)
MCV: 93.1 fL (ref 80.0–100.0)
Monocytes Absolute: 0.4 10*3/uL (ref 0.1–1.0)
Monocytes Relative: 8 %
Neutro Abs: 2.9 10*3/uL (ref 1.7–7.7)
Neutrophils Relative %: 62 %
Platelets: 204 10*3/uL (ref 150–400)
RBC: 4.5 MIL/uL (ref 4.22–5.81)
RDW: 12.5 % (ref 11.5–15.5)
WBC: 4.6 10*3/uL (ref 4.0–10.5)
nRBC: 0 % (ref 0.0–0.2)

## 2021-08-20 LAB — SARS CORONAVIRUS 2 (TAT 6-24 HRS): SARS Coronavirus 2: NEGATIVE

## 2021-08-20 LAB — BASIC METABOLIC PANEL
Anion gap: 7 (ref 5–15)
BUN: 19 mg/dL (ref 8–23)
CO2: 26 mmol/L (ref 22–32)
Calcium: 8.8 mg/dL — ABNORMAL LOW (ref 8.9–10.3)
Chloride: 106 mmol/L (ref 98–111)
Creatinine, Ser: 0.78 mg/dL (ref 0.61–1.24)
GFR, Estimated: >60 mL/min (ref >60)
Glucose, Bld: 115 mg/dL — ABNORMAL HIGH (ref 70–99)
Potassium: 4 mmol/L (ref 3.5–5.1)
Sodium: 139 mmol/L (ref 135–145)

## 2021-08-23 NOTE — H&P (Signed)
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 with chronic pain in the left hip for several years   He also had a right total ankle replacement years ago   Comes in today for preop for left total hip arthroplasty   He is agreed to direct lateral approach   His complaints include pain in his left hip and thigh fairly significant left leg length discrepancy although he says he has had trouble with his gait for quite a period of time   Certain things as expected are difficult for him including getting dressed and putting on his shoes   He is on Mobic and Tylenol arthritis   He also uses a walking stick   He has had 2 bouts of MRSA         HISTORY SECTION :     Review of Systems  Musculoskeletal:  Positive for joint pain.       Right knee pain   constipation  has no past medical history on file.         Past Surgical History:  Procedure Laterality Date   COLONOSCOPY N/A 01/24/2019    Procedure: COLONOSCOPY;  Surgeon: [name], MD;  Location: [facility];  Service: Endoscopy;  Laterality: N/A;  1030   JOINT REPLACEMENT Right      Ankle replacement   KNEE SURGERY       POLYPECTOMY   01/24/2019    Procedure: POLYPECTOMY;  Surgeon: [name], MD;  Location: [facility];  Service: Endoscopy;;  colon   WRIST FRACTURE SURGERY          Social History         Socioeconomic History   Marital status: Married      Spouse name: Not on file   Number of children: Not on file   Years of education: Not on file   Highest education level: Not on file  Occupational History   Not on file  Tobacco Use   Smoking status: Never   Smokeless tobacco: Never  Substance and Sexual Activity   Alcohol use: Yes   Drug use: No   Sexual activity: Not on file  Other Topics Concern   Not on file  Social History Narrative   Not on file    Social  Determinants of Health    Financial Resource Strain: Not on file  Food Insecurity: Not on file  Transportation Needs: Not on file  Physical Activity: Not on file  Stress: Not on file  Social Connections: Not on file  Intimate Partner Violence: Not on file             Family History  Problem Relation Age of Onset   Cancer Father            No Known Allergies     Current Outpatient Medications:    aspirin 81 MG chewable tablet, Chew 81 mg by mouth daily. , Disp: , Rfl:    cetirizine (ZYRTEC) 10 MG tablet, Take 10 mg by mouth at bedtime., Disp: , Rfl:    diazepam (VALIUM) 5 MG tablet, Take 5 mg by mouth every 6 (six) hours as needed., Disp: , Rfl:    ELDERBERRY PO, Take 1 tablet by mouth daily., Disp: , Rfl:    Multiple Vitamin (  MULTIVITAMIN WITH MINERALS) TABS tablet, Take 1 tablet by mouth daily., Disp: , Rfl:    Omega-3 Fatty Acids (FISH OIL PO), Take 1 capsule by mouth daily., Disp: , Rfl:    Probiotic Product (PROBIOTIC PO), Take 1 tablet by mouth daily., Disp: , Rfl:    Specialty Vitamins Products (MAGNESIUM, AMINO ACID CHELATE,) 133 MG tablet, Take 1 tablet by mouth., Disp: , Rfl:    TURMERIC PO, Take 1 tablet by mouth daily., Disp: , Rfl:    meloxicam (MOBIC) 7.5 MG tablet, Take 1 tablet (7.5 mg total) by mouth in the morning and at bedtime., Disp: 60 tablet, Rfl: 0   Current Facility-Administered Medications:    bupivacaine-meloxicam ER (ZYNRELEF) injection 400 mg, 400 mg, Infiltration, Once, [name], MD     PHYSICAL EXAM SECTION: There were no vitals taken for this visit.  There is no height or weight on file to calculate BMI.     General appearance: Well-developed well-nourished no gross deformities   Eyes clear normal vision no evidence of conjunctivitis or jaundice, extraocular muscles intact   ENT: ears hearing normal, nasal passages clear, throat clear    Neck is supple without palpable mass, full range of motion    Cardiovascular normal  pulse and perfusion in all 4 extremities with some hyperemia in his pretibial areas   Lymph nodes: No lymphadenopathy  Neurologically deep tendon reflexes are equal and normal, no sensation loss or deficits no pathologic reflexes     Skin no lacerations or ulcerations no nodularity no palpable masses, no erythema or nodularity   Psychological: Awake alert and oriented x3 mood and affect normal   Musculoskeletal: He has a leg length discrepancy when he supine with the left being shorter than the right this is confirmed with a block test of about half inch and on x-ray with about 17 mm of discrepancy  Hip flexion 90 degrees abduction 30 degrees adduction 20 degrees   Internal rotation to neutral in the supine position with the knee straight   Imaging: I personally read the images and my interpretation is severe arthritis left hip with collapse of the femoral head cysts and sclerosis on both sides of the joint with destruction of the joint as well leg length discrepancy measured 17 mm  LEFT THA

## 2021-08-24 ENCOUNTER — Encounter (HOSPITAL_COMMUNITY): Payer: Self-pay | Admitting: Orthopedic Surgery

## 2021-08-24 ENCOUNTER — Encounter (HOSPITAL_COMMUNITY)
Admission: RE | Disposition: A | Discharge status: Home / Self Care | Payer: Self-pay | Source: Home / Self Care | Attending: Orthopedic Surgery

## 2021-08-24 ENCOUNTER — Ambulatory Visit (HOSPITAL_COMMUNITY): Payer: BC Managed Care – PPO | Admitting: Anesthesiology

## 2021-08-24 ENCOUNTER — Observation Stay (HOSPITAL_COMMUNITY)
Admission: RE | Admit: 2021-08-24 | Discharge: 2021-08-25 | Disposition: A | Discharge status: Home / Self Care | Payer: BC Managed Care – PPO | Attending: Orthopedic Surgery | Admitting: Orthopedic Surgery

## 2021-08-24 ENCOUNTER — Ambulatory Visit (HOSPITAL_COMMUNITY): Payer: BC Managed Care – PPO

## 2021-08-24 ENCOUNTER — Other Ambulatory Visit: Payer: Self-pay

## 2021-08-24 DIAGNOSIS — Z79899 Other long term (current) drug therapy: Secondary | ICD-10-CM | POA: Insufficient documentation

## 2021-08-24 DIAGNOSIS — Z809 Family history of malignant neoplasm, unspecified: Secondary | ICD-10-CM | POA: Insufficient documentation

## 2021-08-24 DIAGNOSIS — Z96649 Presence of unspecified artificial hip joint: Secondary | ICD-10-CM

## 2021-08-24 DIAGNOSIS — Z7982 Long term (current) use of aspirin: Secondary | ICD-10-CM | POA: Insufficient documentation

## 2021-08-24 DIAGNOSIS — M1612 Unilateral primary osteoarthritis, left hip: Secondary | ICD-10-CM | POA: Diagnosis not present

## 2021-08-24 HISTORY — PX: TOTAL HIP ARTHROPLASTY: SHX124

## 2021-08-24 LAB — ABO/RH: ABO/RH(D): O POS

## 2021-08-24 SURGERY — ARTHROPLASTY, HIP, TOTAL,POSTERIOR APPROACH
Anesthesia: Spinal | Site: Hip | Laterality: Left

## 2021-08-24 MED ORDER — ORAL CARE MOUTH RINSE: 15.0000 mL | Freq: Once | OROMUCOSAL | Status: AC

## 2021-08-24 MED ORDER — MENTHOL 3 MG MT LOZG: 1.0000 | LOZENGE | OROMUCOSAL | Status: DC | PRN

## 2021-08-24 MED ORDER — HYDROCODONE-ACETAMINOPHEN 5-325 MG PO TABS: 1.0000 | ORAL_TABLET | ORAL | Status: DC | PRN

## 2021-08-24 MED ORDER — COD LIVER OIL 5000-500 UNIT/5ML PO OIL: TOPICAL_OIL | Freq: Every morning | ORAL | Status: DC

## 2021-08-24 MED ORDER — ACETAMINOPHEN 325 MG PO TABS: 325.0000 mg | ORAL_TABLET | Freq: Four times a day (QID) | ORAL | Status: DC | PRN

## 2021-08-24 MED ORDER — ONDANSETRON HCL 4 MG/2ML IJ SOLN
4.0000 mg | Freq: Four times a day (QID) | INTRAMUSCULAR | Status: DC
  Administered 2021-08-24 (×2): 4 mg via INTRAVENOUS
  Filled 2021-08-24 (×2): qty 2

## 2021-08-24 MED ORDER — DOCUSATE SODIUM 100 MG PO CAPS
100.0000 mg | ORAL_CAPSULE | Freq: Two times a day (BID) | ORAL | Status: DC
  Administered 2021-08-24 – 2021-08-25 (×3): 100 mg via ORAL
  Filled 2021-08-24 (×3): qty 1

## 2021-08-24 MED ORDER — CHLORHEXIDINE GLUCONATE 0.12 % MT SOLN
15.0000 mL | Freq: Once | OROMUCOSAL | Status: AC
  Administered 2021-08-24: 15 mL via OROMUCOSAL
  Filled 2021-08-24: qty 15

## 2021-08-24 MED ORDER — VANCOMYCIN HCL IN DEXTROSE 1-5 GM/200ML-% IV SOLN
1000.0000 mg | Freq: Two times a day (BID) | INTRAVENOUS | Status: AC
  Administered 2021-08-24: 1000 mg via INTRAVENOUS
  Filled 2021-08-24: qty 200

## 2021-08-24 MED ORDER — HYDROMORPHONE HCL 1 MG/ML IJ SOLN
0.2500 mg | INTRAMUSCULAR | Status: DC | PRN
  Administered 2021-08-24 (×3): 0.25 mg via INTRAVENOUS
  Administered 2021-08-24 (×2): 0.5 mg via INTRAVENOUS
  Filled 2021-08-24 (×3): qty 0.5

## 2021-08-24 MED ORDER — EPHEDRINE SULFATE-NACL 50-0.9 MG/10ML-% IV SOSY
PREFILLED_SYRINGE | INTRAVENOUS | Status: DC | PRN
  Administered 2021-08-24 (×2): 5 mg via INTRAVENOUS
  Administered 2021-08-24: 10 mg via INTRAVENOUS

## 2021-08-24 MED ORDER — BUPIVACAINE-EPINEPHRINE (PF) 0.5% -1:200000 IJ SOLN
INTRAMUSCULAR | Status: AC
  Filled 2021-08-24: qty 30

## 2021-08-24 MED ORDER — POVIDONE-IODINE 10 % EX SWAB: 2.0000 "application " | Freq: Once | CUTANEOUS | Status: DC

## 2021-08-24 MED ORDER — BUPIVACAINE LIPOSOME 1.3 % IJ SUSP
INTRAMUSCULAR | Status: DC | PRN
  Administered 2021-08-24: 20 mL

## 2021-08-24 MED ORDER — BISACODYL 5 MG PO TBEC: 5.0000 mg | DELAYED_RELEASE_TABLET | Freq: Every day | ORAL | Status: DC | PRN

## 2021-08-24 MED ORDER — ONDANSETRON HCL 4 MG PO TABS: 4.0000 mg | ORAL_TABLET | Freq: Four times a day (QID) | ORAL | Status: DC | PRN

## 2021-08-24 MED ORDER — TURMERIC 500 MG PO TABS: 500.0000 mg | ORAL_TABLET | Freq: Every morning | ORAL | Status: DC

## 2021-08-24 MED ORDER — ELDERBERRY 575 MG/5ML PO SYRP: 225.0000 mg | ORAL_SOLUTION | Freq: Every morning | ORAL | Status: DC

## 2021-08-24 MED ORDER — OXYCODONE HCL 5 MG PO TABS
5.0000 mg | ORAL_TABLET | ORAL | Status: DC
Start: 2021-08-24 — End: 2021-08-25
  Administered 2021-08-24 – 2021-08-25 (×5): 5 mg via ORAL
  Filled 2021-08-24 (×5): qty 1

## 2021-08-24 MED ORDER — ROCURONIUM BROMIDE 10 MG/ML (PF) SYRINGE
PREFILLED_SYRINGE | INTRAVENOUS | Status: AC
  Filled 2021-08-24: qty 10

## 2021-08-24 MED ORDER — EPHEDRINE 5 MG/ML INJ
INTRAVENOUS | Status: AC
  Filled 2021-08-24: qty 5

## 2021-08-24 MED ORDER — PHENOL 1.4 % MT LIQD: 1.0000 | OROMUCOSAL | Status: DC | PRN

## 2021-08-24 MED ORDER — ONDANSETRON HCL 4 MG/2ML IJ SOLN
INTRAMUSCULAR | Status: AC
  Filled 2021-08-24: qty 2

## 2021-08-24 MED ORDER — TRAMADOL HCL 50 MG PO TABS
50.0000 mg | ORAL_TABLET | Freq: Four times a day (QID) | ORAL | Status: DC
  Administered 2021-08-24 – 2021-08-25 (×4): 50 mg via ORAL
  Filled 2021-08-24 (×5): qty 1

## 2021-08-24 MED ORDER — VANCOMYCIN HCL IN DEXTROSE 1-5 GM/200ML-% IV SOLN
1000.0000 mg | INTRAVENOUS | Status: AC
  Administered 2021-08-24: 1000 mg via INTRAVENOUS
  Filled 2021-08-24: qty 200

## 2021-08-24 MED ORDER — FENTANYL CITRATE (PF) 100 MCG/2ML IJ SOLN
INTRAMUSCULAR | Status: AC
  Filled 2021-08-24: qty 2

## 2021-08-24 MED ORDER — ZINC SULFATE 220 (50 ZN) MG PO CAPS
220.0000 mg | ORAL_CAPSULE | Freq: Every morning | ORAL | Status: DC
  Administered 2021-08-25: 220 mg via ORAL
  Filled 2021-08-24: qty 1

## 2021-08-24 MED ORDER — ALUM & MAG HYDROXIDE-SIMETH 200-200-20 MG/5ML PO SUSP: 30.0000 mL | ORAL | Status: DC | PRN

## 2021-08-24 MED ORDER — PROPOFOL 10 MG/ML IV BOLUS
INTRAVENOUS | Status: AC
  Filled 2021-08-24: qty 20

## 2021-08-24 MED ORDER — DEXAMETHASONE SODIUM PHOSPHATE 10 MG/ML IJ SOLN
10.0000 mg | Freq: Once | INTRAMUSCULAR | Status: AC
  Administered 2021-08-25: 10 mg via INTRAVENOUS
  Filled 2021-08-24: qty 1

## 2021-08-24 MED ORDER — PHENYLEPHRINE HCL-NACL 20-0.9 MG/250ML-% IV SOLN
INTRAVENOUS | Status: AC
  Filled 2021-08-24: qty 250

## 2021-08-24 MED ORDER — DEXMEDETOMIDINE (PRECEDEX) IN NS 20 MCG/5ML (4 MCG/ML) IV SYRINGE
PREFILLED_SYRINGE | INTRAVENOUS | Status: DC | PRN
  Administered 2021-08-24 (×4): 10 ug via INTRAVENOUS

## 2021-08-24 MED ORDER — FENTANYL CITRATE (PF) 100 MCG/2ML IJ SOLN
INTRAMUSCULAR | Status: DC | PRN
  Administered 2021-08-24 (×2): 50 ug via INTRAVENOUS

## 2021-08-24 MED ORDER — PHENYLEPHRINE 40 MCG/ML (10ML) SYRINGE FOR IV PUSH (FOR BLOOD PRESSURE SUPPORT)
PREFILLED_SYRINGE | INTRAVENOUS | Status: AC
  Filled 2021-08-24: qty 10

## 2021-08-24 MED ORDER — ONDANSETRON HCL 4 MG/2ML IJ SOLN: 4.0000 mg | Freq: Four times a day (QID) | INTRAMUSCULAR | Status: DC | PRN

## 2021-08-24 MED ORDER — SODIUM CHLORIDE 0.9 % IV SOLN: INTRAVENOUS | Status: AC

## 2021-08-24 MED ORDER — DEXMEDETOMIDINE (PRECEDEX) IN NS 20 MCG/5ML (4 MCG/ML) IV SYRINGE
PREFILLED_SYRINGE | INTRAVENOUS | Status: AC
  Filled 2021-08-24: qty 5

## 2021-08-24 MED ORDER — POLYETHYLENE GLYCOL 3350 17 G PO PACK
17.0000 g | PACK | Freq: Every day | ORAL | Status: DC
  Administered 2021-08-24 – 2021-08-25 (×2): 17 g via ORAL
  Filled 2021-08-24 (×2): qty 1

## 2021-08-24 MED ORDER — MIDAZOLAM HCL 2 MG/2ML IJ SOLN
INTRAMUSCULAR | Status: AC
  Filled 2021-08-24: qty 2

## 2021-08-24 MED ORDER — SODIUM CHLORIDE 0.9 % IV BOLUS
500.0000 mL | Freq: Once | INTRAVENOUS | Status: AC
  Administered 2021-08-24: 500 mL via INTRAVENOUS

## 2021-08-24 MED ORDER — MAGNESIUM OXIDE -MG SUPPLEMENT 400 (240 MG) MG PO TABS
200.0000 mg | ORAL_TABLET | Freq: Every morning | ORAL | Status: DC
  Administered 2021-08-25: 200 mg via ORAL
  Filled 2021-08-24: qty 1

## 2021-08-24 MED ORDER — METHOCARBAMOL 500 MG PO TABS: 500.0000 mg | ORAL_TABLET | Freq: Four times a day (QID) | ORAL | Status: DC | PRN

## 2021-08-24 MED ORDER — VITAMIN D 25 MCG (1000 UNIT) PO TABS
2000.0000 [IU] | ORAL_TABLET | Freq: Every morning | ORAL | Status: DC
  Administered 2021-08-25: 2000 [IU] via ORAL
  Filled 2021-08-24: qty 2

## 2021-08-24 MED ORDER — TRANEXAMIC ACID-NACL 1000-0.7 MG/100ML-% IV SOLN
1000.0000 mg | Freq: Once | INTRAVENOUS | Status: AC
  Administered 2021-08-24: 1000 mg via INTRAVENOUS
  Filled 2021-08-24: qty 100

## 2021-08-24 MED ORDER — BUPIVACAINE LIPOSOME 1.3 % IJ SUSP
INTRAMUSCULAR | Status: AC
  Filled 2021-08-24: qty 20

## 2021-08-24 MED ORDER — ONDANSETRON HCL 4 MG/2ML IJ SOLN
INTRAMUSCULAR | Status: DC | PRN
Start: 2021-08-24 — End: 2021-08-24
  Administered 2021-08-24: 4 mg via INTRAVENOUS

## 2021-08-24 MED ORDER — DIPHENHYDRAMINE HCL 12.5 MG/5ML PO ELIX: 12.5000 mg | ORAL_SOLUTION | ORAL | Status: DC | PRN

## 2021-08-24 MED ORDER — MORPHINE SULFATE (PF) 2 MG/ML IV SOLN: 0.5000 mg | INTRAVENOUS | Status: DC | PRN

## 2021-08-24 MED ORDER — ASPIRIN EC 325 MG PO TBEC
325.0000 mg | DELAYED_RELEASE_TABLET | Freq: Every day | ORAL | Status: DC
  Administered 2021-08-25: 325 mg via ORAL
  Filled 2021-08-24: qty 1

## 2021-08-24 MED ORDER — AMLODIPINE BESYLATE 5 MG PO TABS: 2.5000 mg | ORAL_TABLET | Freq: Every day | ORAL | Status: DC

## 2021-08-24 MED ORDER — METHOCARBAMOL 1000 MG/10ML IJ SOLN
500.0000 mg | Freq: Four times a day (QID) | INTRAVENOUS | Status: DC | PRN
  Filled 2021-08-24: qty 5

## 2021-08-24 MED ORDER — PHENYLEPHRINE 40 MCG/ML (10ML) SYRINGE FOR IV PUSH (FOR BLOOD PRESSURE SUPPORT)
PREFILLED_SYRINGE | INTRAVENOUS | Status: DC | PRN
  Administered 2021-08-24 (×3): 80 ug via INTRAVENOUS
  Administered 2021-08-24: 120 ug via INTRAVENOUS
  Administered 2021-08-24: 80 ug via INTRAVENOUS
  Administered 2021-08-24: 120 ug via INTRAVENOUS
  Administered 2021-08-24: 80 ug via INTRAVENOUS

## 2021-08-24 MED ORDER — METHOCARBAMOL 1000 MG/10ML IJ SOLN
500.0000 mg | Freq: Four times a day (QID) | INTRAVENOUS | Status: DC
  Administered 2021-08-24 – 2021-08-25 (×2): 500 mg via INTRAVENOUS
  Filled 2021-08-24: qty 500

## 2021-08-24 MED ORDER — LORATADINE 10 MG PO TABS
10.0000 mg | ORAL_TABLET | Freq: Every day | ORAL | Status: DC
  Administered 2021-08-24 – 2021-08-25 (×2): 10 mg via ORAL
  Filled 2021-08-24 (×2): qty 1

## 2021-08-24 MED ORDER — RISAQUAD PO CAPS
1.0000 | ORAL_CAPSULE | Freq: Every morning | ORAL | Status: DC
  Administered 2021-08-25: 1 via ORAL
  Filled 2021-08-24: qty 1

## 2021-08-24 MED ORDER — 0.9 % SODIUM CHLORIDE (POUR BTL) OPTIME
TOPICAL | Status: DC | PRN
  Administered 2021-08-24: 1000 mL

## 2021-08-24 MED ORDER — LIDOCAINE 2% (20 MG/ML) 5 ML SYRINGE
INTRAMUSCULAR | Status: DC | PRN
  Administered 2021-08-24 (×2): 50 mg via INTRAVENOUS

## 2021-08-24 MED ORDER — CELECOXIB 100 MG PO CAPS
200.0000 mg | ORAL_CAPSULE | Freq: Every day | ORAL | Status: DC
  Administered 2021-08-24 – 2021-08-25 (×2): 200 mg via ORAL
  Filled 2021-08-24 (×2): qty 2

## 2021-08-24 MED ORDER — TRANEXAMIC ACID-NACL 1000-0.7 MG/100ML-% IV SOLN
1000.0000 mg | INTRAVENOUS | Status: AC
  Administered 2021-08-24: 1000 mg via INTRAVENOUS
  Filled 2021-08-24: qty 100

## 2021-08-24 MED ORDER — LACTATED RINGERS IV SOLN: INTRAVENOUS | Status: DC

## 2021-08-24 MED ORDER — PREGABALIN 50 MG PO CAPS
50.0000 mg | ORAL_CAPSULE | Freq: Three times a day (TID) | ORAL | Status: DC
  Administered 2021-08-24 – 2021-08-25 (×4): 50 mg via ORAL
  Filled 2021-08-24 (×4): qty 1

## 2021-08-24 MED ORDER — ONDANSETRON HCL 4 MG/2ML IJ SOLN: 4.0000 mg | Freq: Once | INTRAMUSCULAR | Status: DC | PRN

## 2021-08-24 MED ORDER — SODIUM CHLORIDE 0.9 % IR SOLN
Status: DC | PRN
  Administered 2021-08-24: 3000 mL

## 2021-08-24 MED ORDER — HYDROCODONE-ACETAMINOPHEN 7.5-325 MG PO TABS: 1.0000 | ORAL_TABLET | ORAL | Status: DC | PRN

## 2021-08-24 MED ORDER — PROPOFOL 10 MG/ML IV BOLUS
INTRAVENOUS | Status: DC | PRN
  Administered 2021-08-24: 40 mg via INTRAVENOUS
  Administered 2021-08-24: 30 mg via INTRAVENOUS
  Administered 2021-08-24: 40 mg via INTRAVENOUS
  Administered 2021-08-24: 30 mg via INTRAVENOUS
  Administered 2021-08-24: 20 mg via INTRAVENOUS
  Administered 2021-08-24 (×2): 30 mg via INTRAVENOUS

## 2021-08-24 MED ORDER — ACETAMINOPHEN 500 MG PO TABS
500.0000 mg | ORAL_TABLET | Freq: Four times a day (QID) | ORAL | Status: AC
  Administered 2021-08-24 – 2021-08-25 (×3): 500 mg via ORAL
  Filled 2021-08-24 (×4): qty 1

## 2021-08-24 MED ORDER — METOCLOPRAMIDE HCL 10 MG PO TABS: 5.0000 mg | ORAL_TABLET | Freq: Three times a day (TID) | ORAL | Status: DC | PRN

## 2021-08-24 MED ORDER — PROPOFOL 500 MG/50ML IV EMUL
INTRAVENOUS | Status: DC | PRN
  Administered 2021-08-24: 50 ug/kg/min via INTRAVENOUS
  Administered 2021-08-24: 120 ug/kg/min via INTRAVENOUS

## 2021-08-24 MED ORDER — LIDOCAINE HCL (PF) 2 % IJ SOLN
INTRAMUSCULAR | Status: AC
  Filled 2021-08-24: qty 5

## 2021-08-24 MED ORDER — METOCLOPRAMIDE HCL 5 MG/ML IJ SOLN: 5.0000 mg | Freq: Three times a day (TID) | INTRAMUSCULAR | Status: DC | PRN

## 2021-08-24 MED ORDER — MIDAZOLAM HCL 5 MG/5ML IJ SOLN
INTRAMUSCULAR | Status: DC | PRN
Start: 2021-08-24 — End: 2021-08-24
  Administered 2021-08-24 (×2): 2 mg via INTRAVENOUS

## 2021-08-24 MED ORDER — PHENYLEPHRINE HCL-NACL 20-0.9 MG/250ML-% IV SOLN
INTRAVENOUS | Status: DC | PRN
  Administered 2021-08-24: 75 ug/min via INTRAVENOUS
  Administered 2021-08-24: 50 ug/min via INTRAVENOUS

## 2021-08-24 SURGICAL SUPPLY — 63 items
BIT DRILL 2.8X128 (BIT) ×2 IMPLANT
BLADE HEX COATED 2.75 (ELECTRODE) ×2 IMPLANT
BLADE SAGITTAL 25.0X1.27X90 (BLADE) ×2 IMPLANT
CLOTH BEACON ORANGE TIMEOUT ST (SAFETY) ×2 IMPLANT
COVER LIGHT HANDLE STERIS (MISCELLANEOUS) ×4 IMPLANT
CUP ACET PNNCL SECTR W/GRIP 56 (Hips) IMPLANT
CUP SECTOR GRIPTON 58MM (Orthopedic Implant) ×1 IMPLANT
DRAPE BACK TABLE (DRAPES) ×2 IMPLANT
DRAPE HIP W/POCKET STRL (MISCELLANEOUS) ×2 IMPLANT
DRAPE U-SHAPE 47X51 STRL (DRAPES) ×2 IMPLANT
DRSG MEPILEX BORDER 4X12 (GAUZE/BANDAGES/DRESSINGS) ×2 IMPLANT
DRSG MEPILEX SACRM 8.7X9.8 (GAUZE/BANDAGES/DRESSINGS) ×2 IMPLANT
DURAPREP 26ML APPLICATOR (WOUND CARE) ×4 IMPLANT
ELECT REM PT RETURN 9FT ADLT (ELECTROSURGICAL) ×2
ELECTRODE REM PT RTRN 9FT ADLT (ELECTROSURGICAL) ×1 IMPLANT
ELIMINATOR HOLE APEX DEPUY (Hips) ×1 IMPLANT
GLOVE SS N UNI LF 8.5 STRL (GLOVE) ×2 IMPLANT
GLOVE SURG POLYISO LF SZ8 (GLOVE) ×4 IMPLANT
GLOVE SURG UNDER POLY LF SZ7 (GLOVE) ×8 IMPLANT
GOWN STRL REUS W/TWL LRG LVL3 (GOWN DISPOSABLE) ×5 IMPLANT
GOWN STRL REUS W/TWL XL LVL3 (GOWN DISPOSABLE) ×2 IMPLANT
HANDPIECE INTERPULSE COAX TIP (DISPOSABLE) ×1
HEAD CERAMIC 36 PLUS 8.5 12 14 (Hips) ×1 IMPLANT
HOOD W/PEELAWAY (MISCELLANEOUS) ×7 IMPLANT
INST SET MAJOR BONE (KITS) ×2 IMPLANT
IV NS IRRIG 3000ML ARTHROMATIC (IV SOLUTION) ×2 IMPLANT
KIT BLADEGUARD II DBL (SET/KITS/TRAYS/PACK) ×2 IMPLANT
KIT TURNOVER KIT A (KITS) ×2 IMPLANT
LINER NEUTRAL 36X58 PLUS4 ×1 IMPLANT
MANIFOLD NEPTUNE II (INSTRUMENTS) ×2 IMPLANT
MARKER SKIN DUAL TIP RULER LAB (MISCELLANEOUS) ×2 IMPLANT
NDL HYPO 21X1.5 SAFETY (NEEDLE) ×1 IMPLANT
NDL MAYO 1/2 CRC TROCAR PT (NEEDLE) IMPLANT
NEEDLE HYPO 21X1.5 SAFETY (NEEDLE) ×2 IMPLANT
NEEDLE MAYO 1/2 CRC TROCAR PT (NEEDLE) ×2 IMPLANT
NS IRRIG 1000ML POUR BTL (IV SOLUTION) ×2 IMPLANT
PACK TOTAL JOINT (CUSTOM PROCEDURE TRAY) ×2 IMPLANT
PAD ARMBOARD 7.5X6 YLW CONV (MISCELLANEOUS) ×2 IMPLANT
PASSER SUT SWANSON 36MM LOOP (INSTRUMENTS) ×1 IMPLANT
PIN STMN SNGL STERILE 9X3.6MM (PIN) ×4 IMPLANT
PINN SECTOR W/GRIP ACE CUP 56 (Hips) ×2 IMPLANT
SCREW 6.5MMX30MM (Screw) ×1 IMPLANT
SCREW PINN CAN 6.5X20 (Screw) ×1 IMPLANT
SET BASIN LINEN APH (SET/KITS/TRAYS/PACK) ×2 IMPLANT
SET HNDPC FAN SPRY TIP SCT (DISPOSABLE) ×1 IMPLANT
SPONGE T-LAP 18X18 ~~LOC~~+RFID (SPONGE) ×9 IMPLANT
STAPLER VISISTAT 35W (STAPLE) ×2 IMPLANT
STEM TRI LOC BPS SZ7 W GRIPTON (Hips) IMPLANT
SUT BRALON NAB BRD #1 30IN (SUTURE) ×4 IMPLANT
SUT ETHIBOND 5 LR DA (SUTURE) ×2 IMPLANT
SUT MNCRL 0 VIOLET CTX 36 (SUTURE) ×1 IMPLANT
SUT MON AB 0 CT1 (SUTURE) ×1 IMPLANT
SUT MONOCRYL 0 CTX 36 (SUTURE) ×1
SUT VIC AB 1 CT1 27 (SUTURE) ×3
SUT VIC AB 1 CT1 27XBRD ANTBC (SUTURE) ×2 IMPLANT
SYR 20ML LL LF (SYRINGE) ×1 IMPLANT
SYR BULB IRRIG 60ML STRL (SYRINGE) ×2 IMPLANT
TOWEL OR 17X26 4PK STRL BLUE (TOWEL DISPOSABLE) ×2 IMPLANT
TRAY FOLEY MTR SLVR 16FR STAT (SET/KITS/TRAYS/PACK) ×2 IMPLANT
TRI LOC BPS SZ 7 W GRIPTON (Hips) ×2 IMPLANT
WATER STERILE IRR 1000ML POUR (IV SOLUTION) ×3 IMPLANT
YANKAUER SUCT 12FT TUBE ARGYLE (SUCTIONS) ×2 IMPLANT
YANKAUER SUCT BULB TIP NO VENT (SUCTIONS) ×1 IMPLANT

## 2021-08-24 NOTE — Anesthesia Preprocedure Evaluation (Addendum)
Anesthesia Evaluation    Reviewed: Allergy & Precautions, Patient's Chart, lab work & pertinent test results  Airway Mallampati: II  TM Distance: >3 FB Neck ROM: Full    Dental no notable dental hx.    Pulmonary neg pulmonary ROS,    Pulmonary exam normal        Cardiovascular hypertension, negative cardio ROS Normal cardiovascular exam     Neuro/Psych negative neurological ROS     GI/Hepatic negative GI ROS, Neg liver ROS,   Endo/Other  negative endocrine ROSdiabetes  Renal/GU negative Renal ROS     Musculoskeletal negative musculoskeletal ROS (+) Arthritis , Osteoarthritis,    Abdominal   Peds  Hematology negative hematology ROS (+)   Anesthesia Other Findings   Reproductive/Obstetrics                             Anesthesia Physical Anesthesia Plan  ASA: 2  Anesthesia Plan: Spinal   Post-op Pain Management:    Induction:   PONV Risk Score and Plan:   Airway Management Planned: Nasal Cannula  Additional Equipment:   Intra-op Plan:   Post-operative Plan:   Informed Consent: I have reviewed the patients History and Physical, chart, labs and discussed the procedure including the risks, benefits and alternatives for the proposed anesthesia with the patient or authorized representative who has indicated his/her understanding and acceptance.     Dental advisory given  Plan Discussed with: CRNA  Anesthesia Plan Comments:        Anesthesia Quick Evaluation

## 2021-08-24 NOTE — Anesthesia Postprocedure Evaluation (Signed)
Anesthesia Post Note  Patient: Dillon [name]  Procedure(s) Performed: TOTAL HIP ARTHROPLASTY (Left: Hip)  Patient location during evaluation: PACU Anesthesia Type: Spinal Level of consciousness: oriented and awake and alert Pain management: pain level controlled Vital Signs Assessment: post-procedure vital signs reviewed and stable Respiratory status: spontaneous breathing, respiratory function stable and patient connected to nasal cannula oxygen Cardiovascular status: blood pressure returned to baseline and stable Postop Assessment: no headache, no backache and no apparent nausea or vomiting Anesthetic complications: no   No notable events documented.   Last Vitals:  Vitals:   08/24/21 1215 08/24/21 1230  BP: 101/61 102/61  Pulse: (!) 58 61  Resp:  14  Temp:  (!) 36.3 C  SpO2: 98% 98%    Last Pain:  Vitals:   08/24/21 1230  TempSrc: Axillary  PainSc: 6                  Trixie Rude

## 2021-08-24 NOTE — Anesthesia Procedure Notes (Signed)
Spinal  Patient location during procedure: OR Start time: 08/24/2021 7:40 AM End time: 08/24/2021 7:50 AM Reason for block: surgical anesthesia Staffing Performed: anesthesiologist  Anesthesiologist: Trixie Rude, MD Preanesthetic Checklist Completed: patient identified, IV checked, site marked, risks and benefits discussed, surgical consent, monitors and equipment checked, pre-op evaluation and timeout performed Spinal Block Patient position: sitting Prep: Betadine Patient monitoring: heart rate, cardiac monitor, continuous pulse ox and blood pressure Approach: midline Location: L3-4 Injection technique: single-shot Needle Needle type: Quincke  Needle gauge: 22 G Needle length: 10 cm Needle insertion depth: 7 cm Assessment Sensory level: T8 Additional Notes Difficult, collapsed lumbar disc, slight scoliosis, attempt L4/5, success L3/4

## 2021-08-24 NOTE — Anesthesia Procedure Notes (Signed)
Date/Time: 08/24/2021 8:14 AM Performed by: Orlie Dakin, CRNA Pre-anesthesia Checklist: Patient identified, Emergency Drugs available, Suction available and Patient being monitored Patient Re-evaluated:Patient Re-evaluated prior to induction Oxygen Delivery Method: Non-rebreather mask Induction Type: IV induction Placement Confirmation: positive ETCO2

## 2021-08-24 NOTE — Evaluation (Signed)
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   Clinical Impression  Patient instructed in HEP and direct lateral hip precautions with fair/good return demonstrated and understanding acknowledged, good return for propping up on elbows to hands and keeping right hip in neutral position during supine to sitting with hip pain at baseline.  Patient ambulate in hallway with fair/good return for left heel to toe stepping without loss of balance and tolerated sitting up in chair after therapy with his spouse and friend present in room - nursing staff aware.  Patient will benefit from continued skilled physical therapy in hospital and recommended venue below to increase strength, balance, endurance for safe ADLs and gait.      Recommendations for follow up therapy are one component of a multi-disciplinary discharge planning process, led by the attending physician.  Recommendations may be updated based on patient status, additional functional criteria and insurance authorization.  Follow Up Recommendations Home health PT    Assistance Recommended at Discharge Set up Supervision/Assistance  Patient can return home with the following  A little help with walking and/or transfers;A little help with bathing/dressing/bathroom;Assistance with Insurance account manager (single point cane)  Recommendations for Other Services       Functional Status Assessment Patient has had a recent decline in their functional status and demonstrates the ability to make significant improvements in function in a reasonable and predictable amount of time.     Precautions / Restrictions Precautions Precautions: Fall;Anterior Hip Precaution Booklet Issued:  No Precaution Comments: Patient given handout for direct lateral precautions Restrictions Weight Bearing Restrictions: No (Full weight bearing LLE)      Mobility  Bed Mobility Overal bed mobility: Needs Assistance Bed Mobility: Supine to Sit     Supine to sit: Supervision          Transfers Overall transfer level: Needs assistance Equipment used: Rolling walker (2 wheels) Transfers: Sit to/from Stand, Bed to chair/wheelchair/BSC Sit to Stand: Supervision   Step pivot transfers: Supervision       General transfer comment: labored movement for completing sit to stands, good return for transferring to chair    Ambulation/Gait Ambulation/Gait assistance: Supervision Gait Distance (Feet): 120 Feet Assistive device: Rolling walker (2 wheels) Gait Pattern/deviations: Decreased step length - right, Decreased step length - left, Decreased stance time - left, Decreased stride length, Antalgic Gait velocity: decreased     General Gait Details: fair/good return for left heel to toe stepping without loss of balance, left hip pain at baseline  Stairs            Wheelchair Mobility    Modified Rankin (Stroke Patients Only)       Balance Overall balance assessment: Needs assistance Sitting-balance support: Feet supported, No upper extremity supported Sitting balance-Leahy Scale: Good Sitting balance - Comments: seated at EOB   Standing balance support: During functional activity, Bilateral upper extremity supported Standing balance-Leahy Scale: Fair Standing balance comment: fair/good using RW                             Pertinent Vitals/Pain Pain Assessment Pain Assessment: 0-10 Pain Score: 2  Pain Location: left hip Pain Descriptors / Indicators: Sore Pain Intervention(s): Limited  activity within patient's tolerance, Monitored during session, Repositioned    Home Living Family/patient expects to be discharged to:: Private residence Living  Arrangements: Spouse/significant other Available Help at Discharge: Family;Available 24 hours/day Type of Home: House Home Access: Stairs to enter Entrance Stairs-Rails: None Entrance Stairs-Number of Steps: 1 step into house Alternate Level Stairs-Number of Steps: 6 steps with right siderail to bedroom, 9 steps to basement with right siderail Home Layout: Two level Home Equipment: Conservation officer, nature (2 wheels);BSC/3in1      Prior Function Prior Level of Function : Independent/Modified Independent             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), drives, works as Catering manager ADLs Comments: Independent     Hand Dominance   Dominant Hand: Right    Extremity/Trunk Assessment   Upper Extremity Assessment Upper Extremity Assessment: Overall WFL for tasks assessed    Lower Extremity Assessment Lower Extremity Assessment: Generalized weakness;LLE deficits/detail LLE Deficits / Details: grossly -4/5 LLE: Unable to fully assess due to pain LLE Sensation: WNL LLE Coordination: WNL    Cervical / Trunk Assessment Cervical / Trunk Assessment: Normal  Communication   Communication: No difficulties  Cognition Arousal/Alertness: Awake/alert Behavior During Therapy: WFL for tasks assessed/performed Overall Cognitive Status: Within Functional Limits for tasks assessed                                          General Comments      Exercises Total Joint Exercises Ankle Circles/Pumps: Supine, 10 reps, Strengthening, Left, AROM Quad Sets: AROM, Strengthening, Left, 10 reps, Supine Gluteal Sets: AROM, Strengthening, Left, 10 reps, Supine Short Arc Quad: AROM, Strengthening, Left, 10 reps, Supine Heel Slides: AROM, Strengthening, Left, 10 reps, Supine   Assessment/Plan    PT Assessment Patient needs continued PT services  PT Problem List Decreased strength;Decreased activity tolerance;Decreased  balance;Decreased mobility       PT Treatment Interventions DME instruction;Gait training;Stair training;Functional mobility training;Therapeutic activities;Therapeutic exercise;Balance training;Patient/family education    PT Goals (Current goals can be found in the Care Plan section)  Acute Rehab PT Goals Patient Stated Goal: return home with family to assist PT Goal Formulation: With patient/family Time For Goal Achievement: 08/25/21 Potential to Achieve Goals: Good    Frequency BID     Co-evaluation               AM-PAC PT "6 Clicks" Mobility  Outcome Measure Help needed turning from your back to your side while in a flat bed without using bedrails?: A Little Help needed moving from lying on your back to sitting on the side of a flat bed without using bedrails?: A Little Help needed moving to and from a bed to a chair (including a wheelchair)?: None Help needed standing up from a chair using your arms (e.g., wheelchair or bedside chair)?: A Little Help needed to walk in hospital room?: A Little Help needed climbing 3-5 steps with a railing? : A Lot 6 Click Score: 18    End of Session   Activity Tolerance: Patient tolerated treatment well;Patient limited by fatigue Patient left: in chair;with call bell/phone within reach;with family/visitor present Nurse Communication: Mobility status PT Visit Diagnosis: Unsteadiness on feet (R26.81);Other abnormalities of gait and mobility (R26.89);Muscle weakness (generalized) (M62.81)    Time: 9379-0240 PT Time Calculation (min) (ACUTE ONLY): 31 min  Charges:   PT Evaluation $PT Eval Moderate Complexity: 1 Mod PT Treatments $Therapeutic Activity: 23-37 mins        3:42 PM, 08/24/21 Lonell Grandchild, MPT Physical Therapist with [facility] [phone] office [phone] mobile phone

## 2021-08-24 NOTE — Plan of Care (Signed)
°  Problem: Acute Rehab PT Goals(only PT should resolve) Goal: Pt Will Go Supine/Side To Sit Flowsheets (Taken 08/24/2021 1544) Pt will go Supine/Side to Sit:  Independently  with modified independence Goal: Patient Will Transfer Sit To/From Stand Flowsheets (Taken 08/24/2021 1544) Patient will transfer sit to/from stand: with modified independence Goal: Pt Will Transfer Bed To Chair/Chair To Bed Flowsheets (Taken 08/24/2021 1544) Pt will Transfer Bed to Chair/Chair to Bed: with modified independence Goal: Pt Will Ambulate Flowsheets (Taken 08/24/2021 1544) Pt will Ambulate:  > 125 feet  with modified independence  with rolling walker   3:44 PM, 08/24/21 Lonell Grandchild, MPT Physical Therapist with [facility] [phone] office [phone] mobile phone

## 2021-08-24 NOTE — Interval H&P Note (Signed)
History and Physical Interval Note:  08/24/2021 7:24 AM  Dillon [name]  has presented today for surgery, with the diagnosis of left total hip arthroplasty.  The various methods of treatment have been discussed with the patient and family. After consideration of risks, benefits and other options for treatment, the patient has consented to  Procedure(s): TOTAL HIP ARTHROPLASTY (Left) as a surgical intervention.  The patient's history has been reviewed, patient examined, no change in status, stable for surgery.  I have reviewed the patient's chart and labs.  Questions were answered to the patient's satisfaction.     Arther Abbott

## 2021-08-24 NOTE — Brief Op Note (Addendum)
08/24/2021  10:55 AM  PATIENT:  Dillon [name]  66 y.o. male  PRE-OPERATIVE DIAGNOSIS:  left total hip osteoarthritis  POST-OPERATIVE DIAGNOSIS:  left total hip osteoarthritis  PROCEDURE:  Procedure(s): TOTAL HIP ARTHROPLASTY (Left)  SURGEON:  Surgeon(s) and Role:    [name], MD - Primary  PHYSICIAN ASSISTANT:   ASSISTANTS: cynthia wrenn   ANESTHESIA:   spinal and sedation  EBL:  300 mL   BLOOD ADMINISTERED:none  DRAINS: none   LOCAL MEDICATIONS USED:  OTHER exparel  SPECIMEN:  No Specimen  DISPOSITION OF SPECIMEN:  N/A  COUNTS:  YES  TOURNIQUET:  * No tourniquets in log *  DICTATION: .Dragon Dictation  PLAN OF CARE: Admit to inpatient   PATIENT DISPOSITION:  PACU - hemodynamically stable.   Delay start of Pharmacological VTE agent (>24hrs) due to surgical blood loss or risk of bleeding: yes

## 2021-08-24 NOTE — Transfer of Care (Signed)
Immediate Anesthesia Transfer of Care Note  Patient: Dillon [name]  Procedure(s) Performed: TOTAL HIP ARTHROPLASTY (Left: Hip)  Patient Location: PACU  Anesthesia Type:Spinal  Level of Consciousness: drowsy  Airway & Oxygen Therapy: Patient Spontanous Breathing and Patient connected to face mask oxygen  Post-op Assessment: Report given to RN and Post -op Vital signs reviewed and stable  Post vital signs: Reviewed and stable  Last Vitals:  Vitals Value Taken Time  BP 99/55 08/24/21 1100  Temp    Pulse 56 08/24/21 1100  Resp 15 08/24/21 1100  SpO2 100 % 08/24/21 1100  Vitals shown include unvalidated device data.  Last Pain:  Vitals:   08/24/21 0628  TempSrc: Oral  PainSc: 0-No pain         Complications: No notable events documented.

## 2021-08-24 NOTE — Op Note (Signed)
08/24/2021  10:55 AM  PATIENT:  Dillon [name]  66 y.o. male  PRE-OPERATIVE DIAGNOSIS:  left total hip osteoarthritis  POST-OPERATIVE DIAGNOSIS:  left total hip osteoarthritis  PROCEDURE:  Procedure(s): TOTAL HIP ARTHROPLASTY (Left)  Operative findings extensive arthritis left hip including the head and the acetabulum with extensive synovitis which was hyperemic and had extensive bleeding especially at the inferior margin of the acetabulum bone spurs around the acetabulum including the femoral head and neck  Implants DePuy  Tri-Lock size 7 high offset press-fit stem Size 58 Gription press-fit cup with 2 screws size 28 and 25 Size 36, 8.5 ceramic head Polyethylene size 36+4  Approach direct lateral  The procedure was done as follows  The patient was first seen in the preop area and cleared for surgery.  The surgical site was confirmed and marked as left hip.  Thorough chart review confirmed left hip bursa surgical site and operative plan was reviewed which included lengthening the patient approximately 5 mm  The patient had spinal anesthetic and then was placed in the lateral decubitus position right-side-down left side up with axillary roll and padding of his lower extremities.  We used a Stulberg hip positioner to hold the patient in position.  A Foley catheter was inserted prior to positioning and then the leg was prepped and draped sterilely.  Timeout was performed all instrumentation and equipment was available for surgery radiographs of and available for review.  Direct lateral approach to the hip was performed the leg was placed in neutral position and a straight incision was made centered over the greater trochanter taken proximally to the level of the ASIS and distally approximately 5 cm.  The subcutaneous tissue was divided exposing the fascial layer which was divided in line with the skin incision  There were adhesions of the fascia to the gluteus musculature which had an  unusual rough appearance.  The tip of the trochanter was palpated and the gluteus musculature anterior to was outlined for subperiosteal dissection from the greater trochanter.  A blunt instrument was placed under the musculature between the capsule and the gluteus medius and brought anteriorly to the vastus ridge.  Leg was then externally rotated and the gluteus medius and minimus were excised from the greater trochanter using subperiosteal dissection.  This was carried anteriorly and 2 Steinmann pins were placed to hold this in place.  The capsule was then excised.  A saw was used to remove the femoral head.  The hip was then dislocated into the bag anteriorly and the proximal femur was prepared per manufacture technique using a box osteotome starter reamer canal finder and serial broaching up to a size 7  There was extensive damage of the femoral head was rough there were no smooth areas really there are multiple osteophytes around  Of the leg was placed in extension the labrum and soft tissue around the acetabulum were removed blunt dissection was carried out along the anterior column and a blunt retractor was placed  Posterior resection of the labrum was continued and then a acetabular retractor was placed posteriorly.  At the inferior margin of the acetabulum and in the notch there was extensive soft tissue which was hyperemic and implant removal to find a transverse acetabular ligament bled profusely continuously and had to be cauterized to control.  This took an excessive amount of time to control.  We then placed a retractor underneath the inferior aspect of the acetabulum marked the base of the acetabulum for orientation and  then proceeded to ream to the medial wall and then anatomically aiming for 40 degrees of abduction and 15 degrees of anteversion.  The reaming proceeded up to a size 56  We placed a trial 57 which fit well.  After the reaming was completed we placed some bone graft in the  depth of the acetabulum and then placed a 56 cup.  However this felt loose so I took this cup out and then placed a 58 cup which gave excellent fit.  I placed 2 screws as well.  I placed the whole illuminator and a +4-36 polyethylene liner  I then placed the 7 high offset prosthesis and then trialed using a +5 head which was good but the 8.5 head fit better gave excellent attention and gave the lengthening that we were aiming for.  The hip was stable I was able to extend his hip 10 degrees and externally rotate 50 degrees.  I was able to flex the hip 100 degrees and internally rotated 40 degrees.  Sleep position was stable.  Shuck test was much improved with plus fibrosis +8.5  I decided to use the 8.5 ceramic 36 head.  Prior to placing the head I cleaned off the trunnion  I reduced the hip repeated the stability test and the hip was stable  I irrigated thoroughly and then injected with exparel 20 cc right into the abductor musculature  I closed the abductors through drill holes with #5 suture and then oversew the abductors back to the greater trochanter using interrupted #1 Braylon suture we then did another irrigation.  We placed the leg in abduction and closed the fascia with #1 Braylon.  We then irrigated and we used 2 layers to close the subcu tissue using 2 layers of 0 Monocryl followed by staples  Sterile dressing was applied  Patient was taken the recovery room in stable condition  Postop plan full weightbearing Aspirin for DVT prophylaxis for 30 days Direct lateral hip precautions for 6 weeks Observation stay overnight    SURGEON:  Surgeon(s) and Role:    * [name], MD - Primary  PHYSICIAN ASSISTANT:   ASSISTANTS: cynthia wrenn   ANESTHESIA:   spinal and sedation    EBL:  300 mL   BLOOD ADMINISTERED:none  DRAINS: none   LOCAL MEDICATIONS USED:  OTHER exparel  SPECIMEN:  No Specimen  DISPOSITION OF SPECIMEN:  N/A  COUNTS:  YES  TOURNIQUET:  * No  tourniquets in log *  DICTATION: .Dragon Dictation  PLAN OF CARE: Admit to inpatient   PATIENT DISPOSITION:  PACU - hemodynamically stable.   Delay start of Pharmacological VTE agent (>24hrs) due to surgical blood loss or risk of bleeding: yes

## 2021-08-25 ENCOUNTER — Encounter (HOSPITAL_COMMUNITY): Payer: Self-pay | Admitting: Orthopedic Surgery

## 2021-08-25 DIAGNOSIS — M1612 Unilateral primary osteoarthritis, left hip: Secondary | ICD-10-CM | POA: Diagnosis not present

## 2021-08-25 LAB — BASIC METABOLIC PANEL
Anion gap: 6 (ref 5–15)
BUN: 17 mg/dL (ref 8–23)
CO2: 26 mmol/L (ref 22–32)
Calcium: 7.8 mg/dL — ABNORMAL LOW (ref 8.9–10.3)
Chloride: 102 mmol/L (ref 98–111)
Creatinine, Ser: 0.83 mg/dL (ref 0.61–1.24)
GFR, Estimated: >60 mL/min (ref >60)
Glucose, Bld: 108 mg/dL — ABNORMAL HIGH (ref 70–99)
Potassium: 4.5 mmol/L (ref 3.5–5.1)
Sodium: 134 mmol/L — ABNORMAL LOW (ref 135–145)

## 2021-08-25 LAB — CBC
HCT: 31.2 % — ABNORMAL LOW (ref 39.0–52.0)
Hemoglobin: 9.9 g/dL — ABNORMAL LOW (ref 13.0–17.0)
MCH: 30.5 pg (ref 26.0–34.0)
MCHC: 31.7 g/dL (ref 30.0–36.0)
MCV: 96 fL (ref 80.0–100.0)
Platelets: 155 10*3/uL (ref 150–400)
RBC: 3.25 MIL/uL — ABNORMAL LOW (ref 4.22–5.81)
RDW: 12.6 % (ref 11.5–15.5)
WBC: 6.8 10*3/uL (ref 4.0–10.5)
nRBC: 0 % (ref 0.0–0.2)

## 2021-08-25 LAB — TYPE AND SCREEN
ABO/RH(D): O POS
Antibody Screen: NEGATIVE
Unit division: 0
Unit division: 0

## 2021-08-25 LAB — BPAM RBC
Blood Product Expiration Date: 202302132359
Blood Product Expiration Date: 202302162359
Unit Type and Rh: 5100
Unit Type and Rh: 5100

## 2021-08-25 MED ORDER — ASPIRIN 325 MG PO TBEC: 325.0000 mg | DELAYED_RELEASE_TABLET | Freq: Every day | ORAL | 0 refills | Status: DC

## 2021-08-25 MED ORDER — CELECOXIB 200 MG PO CAPS: 200.0000 mg | ORAL_CAPSULE | Freq: Every day | ORAL | 0 refills | Status: DC

## 2021-08-25 MED ORDER — METHOCARBAMOL 1000 MG/10ML IJ SOLN
INTRAMUSCULAR | Status: AC
  Filled 2021-08-25: qty 10

## 2021-08-25 MED ORDER — DOCUSATE SODIUM 100 MG PO CAPS: 100.0000 mg | ORAL_CAPSULE | Freq: Two times a day (BID) | ORAL | 0 refills | Status: DC

## 2021-08-25 MED ORDER — POLYETHYLENE GLYCOL 3350 17 G PO PACK: 17.0000 g | PACK | Freq: Every day | ORAL | 0 refills | Status: DC

## 2021-08-25 MED ORDER — OXYCODONE-ACETAMINOPHEN 5-325 MG PO TABS: 1.0000 | ORAL_TABLET | ORAL | 0 refills | Status: AC | PRN

## 2021-08-25 MED ORDER — TRAMADOL HCL 50 MG PO TABS: 50.0000 mg | ORAL_TABLET | Freq: Four times a day (QID) | ORAL | 0 refills | Status: AC

## 2021-08-25 MED ORDER — BISACODYL 5 MG PO TBEC: 5.0000 mg | DELAYED_RELEASE_TABLET | Freq: Every day | ORAL | 0 refills | Status: DC | PRN

## 2021-08-25 NOTE — Discharge Summary (Signed)
Physician Discharge Summary  Patient ID: Dillon [name] MRN: [id number] DOB/AGE: 66-Sep-1957 65 y.o.  Admit date: 08/24/2021 Discharge date: 08/25/2021  Admission Diagnoses: left hip arthritis  Discharge Diagnoses: same   Discharged Condition: stable  Procedure: LEFT THA   DIRECT LATERAL/TRANSGLUTEAL APPROACH  DEPUY TRILOCK HO STEM SIZE 7, 61 GRIPTION CUP w/ 2 SCREWS, 58 + 4 POLY, AND 36 CERAMIC 8.5 HEAD   Hospital Course:   ROUTINE   SURGERY DONE W SPINAL AND SEDATION  PT WENT WELL   HE DID HAVE SOME LOW BP's that we handled with volume and stopping his BP meds          Discharge Exam: BP 111/72 (BP Location: Right Arm)    Pulse 66    Temp 98.1 F (36.7 C) (Oral)    Resp 19    Ht 6' (1.829 m)    Wt 99.3 kg    SpO2 95%    BMI 29.70 kg/m  Physical Exam  Normal mentation  Normal incision with no signs of infection  Calf soft   Normal sciatic nerve function   CBC Latest Ref Rng & Units 08/25/2021 08/20/2021 12/15/2020  WBC 4.0 - 10.5 K/uL 6.8 4.6 6.0  Hemoglobin 13.0 - 17.0 g/dL 9.9(L) 14.2 13.5  Hematocrit 39.0 - 52.0 % 31.2(L) 41.9 38.5  Platelets 150 - 400 K/uL 155 204 195     Disposition: Discharge disposition: 01-Home or Self Care       Discharge Instructions     Call MD / Call 911   Complete by: As directed    If you experience chest pain or shortness of breath, CALL 911 and be transported to the hospital emergency room.  If you develope a fever above 101 F, pus (white drainage) or increased drainage or redness at the wound, or calf pain, call your surgeon's office.   Constipation Prevention   Complete by: As directed    Drink plenty of fluids.  Prune juice may be helpful.  You may use a stool softener, such as Colace (over the counter) 100 mg twice a day.  Use MiraLax (over the counter) for constipation as needed.   Diet - low sodium heart healthy   Complete by: As directed    Discharge instructions   Complete by: As directed    Take a  shower on Monday.  Keep the dressing on  Do the exercises as instructed by physical therapy  Take the medications as prescribed  Weight-bear as tolerated with assistive device walker or crutches.  Do not use a cane until the therapist instructions how to do it  No driving for 2 weeks   Increase activity slowly as tolerated   Complete by: As directed    Post-operative opioid taper instructions:   Complete by: As directed    POST-OPERATIVE OPIOID TAPER INSTRUCTIONS: It is important to wean off of your opioid medication as soon as possible. If you do not need pain medication after your surgery it is ok to stop day one. Opioids include: Codeine, Hydrocodone(Norco, Vicodin), Oxycodone(Percocet, oxycontin) and hydromorphone amongst others.  Long term and even short term use of opiods can cause: Increased pain response Dependence Constipation Depression Respiratory depression And more.  Withdrawal symptoms can include Flu like symptoms Nausea, vomiting And more Techniques to manage these symptoms Hydrate well Eat regular healthy meals Stay active Use relaxation techniques(deep breathing, meditating, yoga) Do Not substitute Alcohol to help with tapering If you have been on opioids for less than two weeks  and do not have pain than it is ok to stop all together.  Plan to wean off of opioids This plan should start within one week post op of your joint replacement. Maintain the same interval or time between taking each dose and first decrease the dose.  Cut the total daily intake of opioids by one tablet each day Next start to increase the time between doses. The last dose that should be eliminated is the evening dose.         Allergies as of 08/25/2021   No Known Allergies      Medication List     STOP taking these medications    acetaminophen 500 MG tablet Commonly known as: TYLENOL   amLODipine 2.5 MG tablet Commonly known as: NORVASC   meloxicam 15 MG  tablet Commonly known as: MOBIC   meloxicam 7.5 MG tablet Commonly known as: MOBIC       TAKE these medications    aspirin 325 MG EC tablet Take 1 tablet (325 mg total) by mouth daily with breakfast. Start taking on: August 26, 2021 What changed:  medication strength how much to take when to take this additional instructions   bisacodyl 5 MG EC tablet Commonly known as: DULCOLAX Take 1 tablet (5 mg total) by mouth daily as needed for moderate constipation.   celecoxib 200 MG capsule Commonly known as: CELEBREX Take 1 capsule (200 mg total) by mouth daily. Start taking on: August 26, 2021   cetirizine 10 MG tablet Commonly known as: ZYRTEC Take 10 mg by mouth daily as needed for allergies.   COD LIVER OIL PO Take 415 mg by mouth in the morning.   docusate sodium 100 MG capsule Commonly known as: COLACE Take 1 capsule (100 mg total) by mouth 2 (two) times daily.   ELDERBERRY PO Take 225 mg by mouth in the morning.   Magnesium 250 MG Tabs Take 250 mg by mouth in the morning.   oxyCODONE-acetaminophen 5-325 MG tablet Commonly known as: Percocet Take 1 tablet by mouth every 4 (four) hours as needed for up to 5 days for severe pain.   polyethylene glycol 17 g packet Commonly known as: MIRALAX / GLYCOLAX Take 17 g by mouth daily. Start taking on: August 26, 2021   PROBIOTIC PO Take 1 capsule by mouth in the morning.   traMADol 50 MG tablet Commonly known as: ULTRAM Take 1 tablet (50 mg total) by mouth every 6 (six) hours for 5 days.   TURMERIC PO Take 500 mg by mouth in the morning.   Vitamin D3 50 MCG (2000 UT) Tabs Take 2,000 Units by mouth in the morning.   zinc sulfate 220 (50 Zn) MG capsule Take 220 mg by mouth in the morning.         Signed: Arther Abbott 08/25/2021, 10:00 PM

## 2021-08-25 NOTE — TOC Transition Note (Signed)
Transition of Care [facility]) - CM/SW Discharge Note   Patient Details  Name: Dillon [name] MRN: [id number] Date of Birth: 1956/08/02  Transition of [facility] CM/SW Contact:  Iona Beard, Dresser Phone Number: 08/25/2021, 10:18 AM   Clinical Narrative:    [facility] consulted for [facility] needs. CSW reached out to Stella with [facility] who states that [facility] has been prearranged with their services. CSW updated that pt is likely to discharge today. TOC signing off.   Final next level of care: [place] Barriers to Discharge: No Barriers Identified   Patient Goals and CMS Choice Patient states their goals for this hospitalization and ongoing recovery are:: Home with [facility] CMS Medicare.gov Compare Post Acute Care list provided to:: Patient Choice offered to / list presented to : Patient  Discharge Placement                       Discharge Plan and Services                          HH Arranged: PT [facility] Agency: [facility]     Representative spoke with at [place]: [place] Determinants of Health ([place]) Interventions     Readmission Risk Interventions No flowsheet data found.

## 2021-08-25 NOTE — Progress Notes (Signed)
Discharge instructions reviewed with pt and wife, both state understanding. Pt discharged via WC to POV with personal belonigings in his wife's possession and with walker and cane.

## 2021-08-25 NOTE — Progress Notes (Signed)
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    PT Comments    Patient demonstrates increased endurance/distance for gait training with good return for left heel to toe stepping, going up/down steps using SPC and 1 side rail with step-to pattern without loss of balance and understanding acknowledged by patient and his spouse.  Patient will benefit from continued skilled physical therapy in hospital and recommended venue below to increase strength, balance, endurance for safe ADLs and gait.     Recommendations for follow up therapy are one component of a multi-disciplinary discharge planning process, led by the attending physician.  Recommendations may be updated based on patient status, additional functional criteria and insurance authorization.  Follow Up Recommendations  Home health PT     Assistance Recommended at Discharge Set up Supervision/Assistance  Patient can return home with the following A little help with walking and/or transfers;A little help with bathing/dressing/bathroom;Assistance with Emergency planning/management officer    Recommendations for Other Services       Precautions / Restrictions Precautions Precautions: Fall;Anterior Hip Precaution Booklet Issued: No Precaution Comments: Patient given handout for direct lateral precautions Restrictions Weight Bearing Restrictions: No LLE Weight Bearing:  (Full WB)     Mobility  Bed Mobility Overal bed mobility: Modified Independent             General bed mobility comments: demonstrates good return for keeping left hip in neutral position during supine to sitting    Transfers Overall transfer level: Modified independent                 General  transfer comment: demonstrates good return for completing sit to stands with bed slightly elevated and verbal cues for proper hand placement    Ambulation/Gait Ambulation/Gait assistance: Modified independent (Device/Increase time) Gait Distance (Feet): 200 Feet Assistive device: Rolling walker (2 wheels) Gait Pattern/deviations: Decreased step length - right, Decreased step length - left, Decreased stance time - left, Decreased stride length, Antalgic Gait velocity: decreased     General Gait Details: demonstrates increased endurance/distance for ambulaiton with slightly labored cadence, good return for left heel to toe stepping without loss of balance   Stairs Stairs: Yes Stairs assistance: Modified independent (Device/Increase time) Stair Management: One rail Right, One rail Left, Step to pattern, With cane Number of Stairs: 10 General stair comments: good return for going up/down steps in stairwell using 1 siderail and single point cane without loss of balance and understanding acknowledged by patient and spouse   Wheelchair Mobility    Modified Rankin (Stroke Patients Only)       Balance Overall balance assessment: Needs assistance Sitting-balance support: Feet supported, No upper extremity supported Sitting balance-Leahy Scale: Good Sitting balance - Comments: seated at EOB   Standing balance support: During functional activity, Bilateral upper extremity supported Standing balance-Leahy Scale: Fair Standing balance comment: fair/good using RW                            Cognition Arousal/Alertness: Awake/alert Behavior During Therapy: WFL for tasks assessed/performed Overall Cognitive Status: Within Functional Limits for tasks assessed  Exercises Total Joint Exercises Ankle Circles/Pumps: Supine, 10 reps, Strengthening, Left, AROM Quad Sets: AROM, Strengthening, Left, 10 reps, Supine Gluteal  Sets: AROM, Strengthening, Left, 10 reps, Supine    General Comments        Pertinent Vitals/Pain Pain Assessment Pain Assessment: Faces Pain Score: 2  Pain Location: left hip Pain Descriptors / Indicators: Sore Pain Intervention(s): Limited activity within patient's tolerance, Monitored during session, Premedicated before session, Repositioned    Home Living                          Prior Function            PT Goals (current goals can now be found in the care plan section) Acute Rehab PT Goals Patient Stated Goal: return home with family to assist PT Goal Formulation: With patient/family Time For Goal Achievement: 08/25/21 Potential to Achieve Goals: Good Progress towards PT goals: Progressing toward goals    Frequency    BID      PT Plan Current plan remains appropriate    Co-evaluation              AM-PAC PT "6 Clicks" Mobility   Outcome Measure  Help needed turning from your back to your side while in a flat bed without using bedrails?: None Help needed moving from lying on your back to sitting on the side of a flat bed without using bedrails?: None Help needed moving to and from a bed to a chair (including a wheelchair)?: None Help needed standing up from a chair using your arms (e.g., wheelchair or bedside chair)?: A Little Help needed to walk in hospital room?: A Little Help needed climbing 3-5 steps with a railing? : A Little 6 Click Score: 21    End of Session   Activity Tolerance: Patient tolerated treatment well Patient left: with call bell/phone within reach;Other (comment);with family/visitor present (left in bathroom with family members supervising) Nurse Communication: Mobility status PT Visit Diagnosis: Unsteadiness on feet (R26.81);Other abnormalities of gait and mobility (R26.89);Muscle weakness (generalized) (M62.81)     Time: 5397-6734 PT Time Calculation (min) (ACUTE ONLY): 25 min  Charges:  $Gait Training: 8-22  mins $Therapeutic Activity: 8-22 mins                     8:58 AM, 08/25/21 Lonell Grandchild, MPT Physical Therapist with [facility] [phone] office [phone] mobile phone

## 2021-08-25 NOTE — Progress Notes (Signed)
Patient ID: Dillon [name], male   DOB: 06-Jul-1956, 66 y.o.   MRN: [id number]  POD 1 LEFT THA   BP 111/72 (BP Location: Right Arm)    Pulse 66    Temp 98.1 F (36.7 C) (Oral)    Resp 19    Ht 6' (1.829 m)    Wt 99.3 kg    SpO2 95%    BMI 29.70 kg/m   Hemoglobin & Hematocrit     Component Value Date/Time   HGB 9.9 (L) 08/25/2021 0655   HGB 13.5 12/15/2020 1508   HCT 31.2 (L) 08/25/2021 0655   HCT 38.5 12/15/2020 1508    DRESSING DRY   DRESSING CHANGED TO AQUACEL  He did well with PT yesterday so we anticipate d/c today   The bp's have been stable at around 90-110/50-70

## 2021-09-02 DIAGNOSIS — Z96642 Presence of left artificial hip joint: Secondary | ICD-10-CM | POA: Insufficient documentation

## 2021-09-08 ENCOUNTER — Ambulatory Visit (HOSPITAL_COMMUNITY): Payer: BC Managed Care – PPO | Attending: Orthopedic Surgery | Admitting: Physical Therapy

## 2021-09-08 ENCOUNTER — Encounter (HOSPITAL_COMMUNITY): Payer: Self-pay | Admitting: Physical Therapy

## 2021-09-08 ENCOUNTER — Ambulatory Visit (INDEPENDENT_AMBULATORY_CARE_PROVIDER_SITE_OTHER): Payer: BC Managed Care – PPO | Admitting: Orthopedic Surgery

## 2021-09-08 ENCOUNTER — Other Ambulatory Visit: Payer: Self-pay

## 2021-09-08 DIAGNOSIS — R262 Difficulty in walking, not elsewhere classified: Secondary | ICD-10-CM | POA: Diagnosis present

## 2021-09-08 DIAGNOSIS — M6281 Muscle weakness (generalized): Secondary | ICD-10-CM | POA: Diagnosis not present

## 2021-09-08 DIAGNOSIS — Z96642 Presence of left artificial hip joint: Secondary | ICD-10-CM

## 2021-09-08 DIAGNOSIS — M25552 Pain in left hip: Secondary | ICD-10-CM | POA: Diagnosis present

## 2021-09-08 DIAGNOSIS — M1612 Unilateral primary osteoarthritis, left hip: Secondary | ICD-10-CM | POA: Insufficient documentation

## 2021-09-08 NOTE — Therapy (Signed)
[place], [place], [address] Phone: [phone]   Fax:  [phone]  Physical Therapy Evaluation  Patient Details  Name: Dillon [name] MRN: [id number] Date of Birth: 12/21/1955 Referring Provider (PT): Arther Abbott   Encounter Date: 09/08/2021   PT End of Session - 09/08/21 1554     Visit Number 1    Number of Visits 12    Date for PT Re-Evaluation 10/20/21    Authorization Type bcbs    PT Start Time 1450    PT Stop Time 1528    PT Time Calculation (min) 38 min    Activity Tolerance Patient tolerated treatment well    Behavior During Therapy [facility] for tasks assessed/performed             Past Medical History:  Diagnosis Date   Hypertension    Lumbar spondylosis     Past Surgical History:  Procedure Laterality Date   COLONOSCOPY N/A 01/24/2019   Procedure: COLONOSCOPY;  Surgeon: [name], MD;  Location: [facility];  Service: Endoscopy;  Laterality: N/A;  1030   HAND SURGERY Left    closed reduction left hand   JOINT REPLACEMENT Right    Ankle replacement   KNEE SURGERY     POLYPECTOMY  01/24/2019   Procedure: POLYPECTOMY;  Surgeon: [name], MD;  Location: [facility];  Service: Endoscopy;;  colon   TOTAL HIP ARTHROPLASTY Left 08/24/2021   Procedure: TOTAL HIP ARTHROPLASTY;  Surgeon: [name], MD;  Location: [facility] ORS;  Service: Orthopedics;  Laterality: Left;   WRIST FRACTURE SURGERY      There were no vitals filed for this visit.    Subjective Assessment - 09/08/21 1458     Subjective [name] states that he is slightly fearful of falling.  He is currently ambulating with a cane.  He is walking about 1200 steps a day.  He currently is doing bed exercises.  He is not having any hip pain just surgical pain.  He is going up and down steps one at a time.    Pertinent History RT ankle replacement    Limitations Walking    How long can you walk comfortably?  walking 1200 steps at a time with his cane.    Patient Stated Goals Walk without the cane, to be in and out a the tub, to golf again,    Currently in Pain? Yes    Pain Score 2     Pain Location Hip    Pain Orientation Left    Pain Descriptors / Indicators Aching    Pain Type Acute pain    Pain Onset 1 to 4 weeks ago    Pain Frequency Intermittent    Aggravating Factors  not sure    Pain Relieving Factors rest    Effect of Pain on Daily Activities limits                [facility] PT Assessment - 09/08/21 0001       Assessment   Medical Diagnosis Lt THR    Referring Provider (PT) Arther Abbott    Onset Date/Surgical Date 08/24/21    Next MD Visit 10/03/2021    Prior Therapy [facility]      Precautions   Precautions Anterior Hip   surgeon detaches hip abductors request no strengthening for 6 weeks post.     Restrictions   Weight Bearing Restrictions No  Balance Screen   Has the patient fallen in the past 6 months No    Has the patient had a decrease in activity level because of a fear of falling?  Yes    Is the patient reluctant to leave their home because of a fear of falling?  No      Prior Function   Level of Independence Independent      Cognition   Overall Cognitive Status Within Functional Limits for tasks assessed      Observation/Other Assessments   Focus on Therapeutic Outcomes (FOTO)  54      Functional Tests   Functional tests Single leg stance;Sit to Stand      Single Leg Stance   Comments RT: 3"    , Lt 4"      Sit to Stand   Comments unable to come sit to stand without using his UE.  With his UE able to complete 5 sit to stand in 16.32      ROM / Strength   AROM / PROM / Strength AROM;Strength      AROM   AROM Assessment Site Hip    Right/Left Hip Left    Left Hip Flexion 50      Strength   Strength Assessment Site Hip;Knee;Ankle    Right/Left Hip Left    Left Hip Flexion 3/5    Right/Left Knee Left    Left Knee Extension 5/5    Right/Left  Ankle Left    Left Ankle Dorsiflexion 5/5      Ambulation/Gait   Ambulation Distance (Feet) 327 Feet    Assistive device Straight cane    Gait Pattern Within Functional Limits    Gait Comments 2 MWT                        Objective measurements completed on examination: See above findings.       [facility] Adult PT Treatment/Exercise - 09/08/21 0001       Exercises   Exercises Knee/Hip      Knee/Hip Exercises: Standing   Heel Raises Both;10 reps;2 seconds    Functional Squat 10 reps    SLS x3      Knee/Hip Exercises: Seated   Sit to Sand 5 reps                     PT Education - 09/08/21 1553     Education Details HEP [id number]    Person(s) Educated Patient    Methods Explanation;Handout    Comprehension Returned demonstration;Verbalized understanding              PT Short Term Goals - 09/08/21 1615       PT SHORT TERM GOAL #1   Title PT to be I in his HEP to improve his LT LE strength to feel confident ambulation without an assistive device.    Time 3    Period Weeks    Status New    Target Date 09/29/21      PT SHORT TERM GOAL #2   Title Pt to be able to single leg stance for 20 seconds B to reduce risk of falling.    Time 3    Period Weeks    Status New               PT Long Term Goals - 09/08/21 1617       PT LONG TERM GOAL #1   Title  PT to be I in an advanced HEP to allow pt to come sit to stand from low lying couches without the use of his UE>    Time 6    Status New      PT LONG TERM GOAL #2   Title Pt to have 115 degrees hip flexion to allow him to easily tie his shoes.    Time 6    Period Weeks    Status New      PT LONG TERM GOAL #3   Title PT to be able to go up and down 16 steps in a reciprocal manner using one handrail.    Time 6    Period Weeks    Status New      PT LONG TERM GOAL #4   Title Pt to be able to single leg stance for 30 seconds B to reduce risk of falling and allow pt to feel  comfortable walking on uneven surfaces.    Time 6    Period Weeks    Status New      PT LONG TERM GOAL #5   Title PT to no longer be experiencing any pain in his left hip    Time 6    Period Weeks    Status New                    Plan - 09/08/21 1556     Examination-Activity Limitations Bed Mobility;Dressing;Lift;Stairs;Squat    Examination-Participation Restrictions Cleaning;Community Activity    Stability/Clinical Decision Making Stable/Uncomplicated    Clinical Decision Making Moderate    Rehab Potential Good    PT Frequency 2x / week    PT Duration 6 weeks    PT Treatment/Interventions Balance training;Stair training;Gait training;Therapeutic exercise;Manual techniques;Patient/family education    PT Next Visit Plan Avoid active abduction until 2/28:  rocker board, step ups , lateral step ups, lunges, marching, progressing functional strengthening.    PT Home Exercise Plan sit to stand using UE, heel raises, mini squats, single leg stance.             Patient will benefit from skilled therapeutic intervention in order to improve the following deficits and impairments:  Pain, Decreased strength, Decreased activity tolerance, Decreased range of motion, Difficulty walking  Visit Diagnosis: Muscle weakness (generalized)  Difficulty in walking, not elsewhere classified  Pain in left hip     Problem List Patient Active Problem List   Diagnosis Date Noted   S/P hip replacement, left 08/24/21 09/02/2021   Osteoarthritis of left hip 08/24/2021   Unilateral primary osteoarthritis, left hip    Chronic constipation 07/27/2021   Elevated BP without diagnosis of hypertension 07/27/2021   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

## 2021-09-08 NOTE — Progress Notes (Signed)
Chief complaint postop visit #1 status post left total hip  Direct lateral transgluteal approach  DePuy Tri-Lock high offset stem size 7 encryption 58 cup 2 screws 58+4 poly and a 36 x 8.5 ceramic head  The patient had some low blood pressures in the hospital we stopped his blood pressure medication he is been about 032 systolic doing well  He has his cane he is going to physical therapy soon as an outpatient  He is doing well his staples were removed his wound looks clean.  We are going to reevaluate his leg lengths next visit with blocks  Scheduled to go back to work March 1

## 2021-09-10 ENCOUNTER — Encounter (HOSPITAL_COMMUNITY): Payer: Self-pay | Admitting: Physical Therapy

## 2021-09-10 ENCOUNTER — Ambulatory Visit (HOSPITAL_COMMUNITY): Payer: BC Managed Care – PPO | Admitting: Physical Therapy

## 2021-09-10 ENCOUNTER — Other Ambulatory Visit: Payer: Self-pay

## 2021-09-10 DIAGNOSIS — R262 Difficulty in walking, not elsewhere classified: Secondary | ICD-10-CM

## 2021-09-10 DIAGNOSIS — M6281 Muscle weakness (generalized): Secondary | ICD-10-CM

## 2021-09-10 DIAGNOSIS — M25552 Pain in left hip: Secondary | ICD-10-CM

## 2021-09-10 NOTE — Therapy (Signed)
[place], [place], [address] Phone: [phone]   Fax:  [phone]  Physical Therapy Treatment  Patient Details  Name: Dillon [name] MRN: [id number] Date of Birth: [birth year] Referring Provider (PT): Arther Abbott   Encounter Date: 09/10/2021   PT End of Session - 09/10/21 1439     Visit Number 2    Number of Visits 12    Date for PT Re-Evaluation 10/20/21    Authorization Type bcbs    PT Start Time 1431    PT Stop Time 1512    PT Time Calculation (min) 41 min    Activity Tolerance Patient tolerated treatment well    Behavior During Therapy [facility] for tasks assessed/performed             Past Medical History:  Diagnosis Date   Hypertension    Lumbar spondylosis     Past Surgical History:  Procedure Laterality Date   COLONOSCOPY N/A 01/24/2019   Procedure: COLONOSCOPY;  Surgeon: [name], MD;  [facility];  Service: Endoscopy;  Laterality: N/A;  1030   HAND SURGERY Left    closed reduction left hand   JOINT REPLACEMENT Right    Ankle replacement   KNEE SURGERY     POLYPECTOMY  01/24/2019   Procedure: POLYPECTOMY;  Surgeon: [name], MD;  [facility];  Service: Endoscopy;;  colon   TOTAL HIP ARTHROPLASTY Left 08/24/2021   Procedure: TOTAL HIP ARTHROPLASTY;  Surgeon: [name], MD;  Location: [facility];  Service: Orthopedics;  Laterality: Left;   WRIST FRACTURE SURGERY      There were no vitals filed for this visit.   Subjective Assessment - 09/10/21 1517     Subjective Patient reports that his pain levels in the lt hip have been very well controlled. He states that the only difficulty he is having so far is standing up from a chair without the use of his arms.    Pertinent History RT ankle replacement    Limitations Walking    How long can you walk comfortably? walking 1200 steps at a time with his cane.    Patient Stated Goals Walk without the  cane, to be in and out a the tub, to golf again,    Currently in Pain? No/denies    Pain Onset 1 to 4 weeks ago                               [facility] Adult PT Treatment/Exercise - 09/10/21 0001       Therapeutic Activites    Therapeutic Activities ADL's    ADL's STS transfers from elevated surface without UE use 2x10, STS transfers on balance board w/ Lt side block 3x10 (floor length mirror used for all sets)      Knee/Hip Exercises: Aerobic   Nustep x59min      Knee/Hip Exercises: Seated   Other Seated Knee/Hip Exercises Reclined seated march(focus on vertical tibia) 2x14 and Reclined seated march(focus on vertical tibia) w/ banded(red) hip abd iso 3x8 (floor length mirror used for all but first set)                       PT Short Term Goals - 09/08/21 1615       PT SHORT TERM GOAL #1   Title PT to be I in his HEP to  improve his LT LE strength to feel confident ambulation without an assistive device.    Time 3    Period Weeks    Status New    Target Date 09/29/21      PT SHORT TERM GOAL #2   Title Pt to be able to single leg stance for 20 seconds B to reduce risk of falling.    Time 3    Period Weeks    Status New               PT Long Term Goals - 09/08/21 1617       PT LONG TERM GOAL #1   Title PT to be I in an advanced HEP to allow pt to come sit to stand from low lying couches without the use of his UE>    Time 6    Status New      PT LONG TERM GOAL #2   Title Pt to have 115 degrees hip flexion to allow him to easily tie his shoes.    Time 6    Period Weeks    Status New      PT LONG TERM GOAL #3   Title PT to be able to go up and down 16 steps in a reciprocal manner using one handrail.    Time 6    Period Weeks    Status New      PT LONG TERM GOAL #4   Title Pt to be able to single leg stance for 30 seconds B to reduce risk of falling and allow pt to feel comfortable walking on uneven surfaces.    Time 6    Period  Weeks    Status New      PT LONG TERM GOAL #5   Title PT to no longer be experiencing any pain in his left hip    Time 6    Period Weeks    Status New                   Plan - 09/10/21 1440     Clinical Impression Statement Patient tolerated treatment session well with regard to pain level, but did struggle with maintaining adequate form and balance during the sit to stand activities. Prior to the addition of the balance board he did have a Rt weight shift, but this reversed once the balance board was added. He was able to reduce this to some degree along with the visual cue of the floor length mirror. There were only 2 events of pronounced Rt board lean during the session, but the patient was able to self correct without my intervention.    Examination-Activity Limitations Bed Mobility;Dressing;Lift;Stairs;Squat    Examination-Participation Restrictions Cleaning;Community Activity    Stability/Clinical Decision Making Stable/Uncomplicated    Rehab Potential Good    PT Frequency 2x / week    PT Duration 6 weeks    PT Treatment/Interventions Balance training;Stair training;Gait training;Therapeutic exercise;Manual techniques;Patient/family education    PT Next Visit Plan Avoid active abduction until 2/28:  rocker board, step ups , lateral step ups, lunges, marching, progressing functional strengthening. Continue with STS transfers focusing on no UE use.    PT Home Exercise Plan sit to stand using UE, heel raises, mini squats, single leg stance.             Patient will benefit from skilled therapeutic intervention in order to improve the following deficits and impairments:  Pain, Decreased strength, Decreased activity tolerance, Decreased  range of motion, Difficulty walking  Visit Diagnosis: Muscle weakness (generalized)  Difficulty in walking, not elsewhere classified  Pain in left hip     Problem List Patient Active Problem List   Diagnosis Date Noted   S/P hip  replacement, left 08/24/21 09/02/2021   Osteoarthritis of left hip 08/24/2021   Unilateral primary osteoarthritis, left hip    Chronic constipation 07/27/2021   Elevated BP without diagnosis of hypertension 07/27/2021   OA (osteoarthritis) of ankle 09/26/2012   Pain in joint, ankle and foot 09/26/2012    Adalberto Cole, PT 09/10/2021, 3:27 PM  [place], [place], [address] Phone: [phone]   Fax:  [phone]  Name: Dillon [name] MRN: [id number] Date of Birth: 10-21-1955

## 2021-09-13 ENCOUNTER — Other Ambulatory Visit: Payer: Self-pay

## 2021-09-13 ENCOUNTER — Ambulatory Visit (HOSPITAL_COMMUNITY): Payer: BC Managed Care – PPO | Admitting: Physical Therapy

## 2021-09-13 DIAGNOSIS — M25552 Pain in left hip: Secondary | ICD-10-CM

## 2021-09-13 DIAGNOSIS — M6281 Muscle weakness (generalized): Secondary | ICD-10-CM

## 2021-09-13 DIAGNOSIS — R262 Difficulty in walking, not elsewhere classified: Secondary | ICD-10-CM

## 2021-09-13 NOTE — Therapy (Signed)
[place], [place], [address] Phone: [phone]   Fax:  [phone]  Physical Therapy Treatment  Patient Details  Name: Dillon [name] MRN: [id number] Date of Birth: 1956-01-08 Referring Provider (PT): Arther Abbott   Encounter Date: 09/13/2021   PT End of Session - 09/13/21 1430     Visit Number 3    Number of Visits 12    Date for PT Re-Evaluation 10/20/21    Authorization Type bcbs    PT Start Time 1430    PT Stop Time 1515    PT Time Calculation (min) 45 min    Activity Tolerance Patient tolerated treatment well    Behavior During Therapy [facility] for tasks assessed/performed             Past Medical History:  Diagnosis Date   Hypertension    Lumbar spondylosis     Past Surgical History:  Procedure Laterality Date   COLONOSCOPY N/A 01/24/2019   Procedure: COLONOSCOPY;  Surgeon: [name], MD;  [facility];  Service: Endoscopy;  Laterality: N/A;  1030   HAND SURGERY Left    closed reduction left hand   JOINT REPLACEMENT Right    Ankle replacement   KNEE SURGERY     POLYPECTOMY  01/24/2019   Procedure: POLYPECTOMY;  Surgeon: [name], MD;  [facility];  Service: Endoscopy;;  colon   TOTAL HIP ARTHROPLASTY Left 08/24/2021   Procedure: TOTAL HIP ARTHROPLASTY;  Surgeon: [name], MD;  [facility];  Service: Orthopedics;  Laterality: Left;   WRIST FRACTURE SURGERY      There were no vitals filed for this visit.   Subjective Assessment - 09/13/21 1435     Subjective Patient reports that he is confused about how the precautions he has been given affect his ability to get in and out of his car. He states that he has not had pain with any of these transfers, but just wants to make sure he isnot putting the hip in jeopardy.    Pertinent History RT ankle replacement    Limitations Walking    How long can you walk comfortably? walking 1200 steps at  a time with his cane.    Patient Stated Goals Walk without the cane, to be in and out a the tub, to golf again,    Currently in Pain? No/denies    Pain Onset 1 to 4 weeks ago                               [facility] Adult PT Treatment/Exercise - 09/13/21 0001       Therapeutic Activites    Therapeutic Activities ADL's    ADL's STS transfers on balance board w/ Lt side block 4x12      Knee/Hip Exercises: Aerobic   Nustep x56min      Knee/Hip Exercises: Standing   Other Standing Knee Exercises Bent over glute punch([employer]) #3 1x10 and #4 2x10    Other Standing Knee Exercises Bent over knee drives(Body Craft) #2 3x12      Knee/Hip Exercises: Seated   Marching Other (comment)   Slow march on foam 2x30s and solid ground 1x30s                      PT Short Term Goals - 09/08/21 1615       PT  SHORT TERM GOAL #1   Title PT to be I in his HEP to improve his LT LE strength to feel confident ambulation without an assistive device.    Time 3    Period Weeks    Status New    Target Date 09/29/21      PT SHORT TERM GOAL #2   Title Pt to be able to single leg stance for 20 seconds B to reduce risk of falling.    Time 3    Period Weeks    Status New               PT Long Term Goals - 09/08/21 1617       PT LONG TERM GOAL #1   Title PT to be I in an advanced HEP to allow pt to come sit to stand from low lying couches without the use of his UE>    Time 6    Status New      PT LONG TERM GOAL #2   Title Pt to have 115 degrees hip flexion to allow him to easily tie his shoes.    Time 6    Period Weeks    Status New      PT LONG TERM GOAL #3   Title PT to be able to go up and down 16 steps in a reciprocal manner using one handrail.    Time 6    Period Weeks    Status New      PT LONG TERM GOAL #4   Title Pt to be able to single leg stance for 30 seconds B to reduce risk of falling and allow pt to feel comfortable walking on uneven  surfaces.    Time 6    Period Weeks    Status New      PT LONG TERM GOAL #5   Title PT to no longer be experiencing any pain in his left hip    Time 6    Period Weeks    Status New                   Plan - 09/13/21 1525     Clinical Impression Statement Patient had improved tolerance of today's treatment session and there was not an event of LOB during the balance board squats. With the bent over glute punches, he did struggle with the remainder of expected hip extension from the range of ~25 degrees to neutral and often stopped short of neutral. Control with eccentric motion was acceptable for all motions with the exception of the final set of the balance board squats as it was from the lowest height surface.    Examination-Activity Limitations Bed Mobility;Dressing;Lift;Stairs;Squat    Examination-Participation Restrictions Cleaning;Community Activity    Stability/Clinical Decision Making Stable/Uncomplicated    Rehab Potential Good    PT Frequency 2x / week    PT Duration 6 weeks    PT Treatment/Interventions Balance training;Stair training;Gait training;Therapeutic exercise;Manual techniques;Patient/family education    PT Next Visit Plan Avoid active abduction until 2/28:  rocker board, step ups , lateral step ups, lunges, marching, progressing functional strengthening. Continue with STS transfers focusing on no UE use.    PT Home Exercise Plan sit to stand using UE, heel raises, mini squats, single leg stance.             Patient will benefit from skilled therapeutic intervention in order to improve the following deficits and impairments:  Pain, Decreased strength, Decreased  activity tolerance, Decreased range of motion, Difficulty walking  Visit Diagnosis: Muscle weakness (generalized)  Difficulty in walking, not elsewhere classified  Pain in left hip     Problem List Patient Active Problem List   Diagnosis Date Noted   S/P hip replacement, left 08/24/21  09/02/2021   Osteoarthritis of left hip 08/24/2021   Unilateral primary osteoarthritis, left hip    Chronic constipation 07/27/2021   Elevated BP without diagnosis of hypertension 07/27/2021   OA (osteoarthritis) of ankle 09/26/2012   Pain in joint, ankle and foot 09/26/2012    Adalberto Cole, PT 09/13/2021, 3:29 PM  [place], [place], [address] Phone: [phone]   Fax:  [phone]  Name: Dillon [name] MRN: [id number] Date of Birth: 02/25/1956

## 2021-09-15 ENCOUNTER — Ambulatory Visit (HOSPITAL_COMMUNITY): Payer: BC Managed Care – PPO

## 2021-09-17 ENCOUNTER — Encounter (HOSPITAL_COMMUNITY): Payer: BC Managed Care – PPO | Admitting: Physical Therapy

## 2021-09-20 ENCOUNTER — Other Ambulatory Visit: Payer: Self-pay

## 2021-09-20 ENCOUNTER — Ambulatory Visit (HOSPITAL_COMMUNITY): Payer: BC Managed Care – PPO | Admitting: Physical Therapy

## 2021-09-20 DIAGNOSIS — M25552 Pain in left hip: Secondary | ICD-10-CM

## 2021-09-20 DIAGNOSIS — M6281 Muscle weakness (generalized): Secondary | ICD-10-CM | POA: Diagnosis not present

## 2021-09-20 NOTE — Therapy (Addendum)
[place], [place], [address] Phone: [phone]   Fax:  [phone]  Physical Therapy Treatment  Patient Details  Name: Dillon [name] MRN: [id number] Date of Birth: 08/06/1956 Referring Provider (PT): Arther Abbott   Encounter Date: 09/20/2021   PT End of Session - 09/20/21 1642     Visit Number 4    Number of Visits 12    Date for PT Re-Evaluation 10/20/21    Authorization Type bcbs    PT Start Time 1615    PT Stop Time 1700    PT Time Calculation (min) 45 min    Activity Tolerance Patient tolerated treatment well    Behavior During Therapy [facility] for tasks assessed/performed             Past Medical History:  Diagnosis Date   Hypertension    Lumbar spondylosis     Past Surgical History:  Procedure Laterality Date   COLONOSCOPY N/A 01/24/2019   Procedure: COLONOSCOPY;  Surgeon: Dillon Houston, MD;  [facility];  Service: Endoscopy;  Laterality: N/A;  1030   HAND SURGERY Left    closed reduction left hand   JOINT REPLACEMENT Right    Ankle replacement   KNEE SURGERY     POLYPECTOMY  01/24/2019   Procedure: POLYPECTOMY;  Surgeon: Dillon Houston, MD;  [facility];  Service: Endoscopy;;  colon   TOTAL HIP ARTHROPLASTY Left 08/24/2021   Procedure: TOTAL HIP ARTHROPLASTY;  Surgeon: Dillon Civil, MD;  Location: [facility];  Service: Orthopedics;  Laterality: Left;   WRIST FRACTURE SURGERY      There were no vitals filed for this visit.   Subjective Assessment - 09/20/21 1617     Subjective Pt feels that he is doing very well    Pertinent History RT ankle replacement    Limitations Walking    How long can you walk comfortably? Pt is walking about 500 yards at a time without his cane ; walks with a cane for the rest of the mile    Patient Stated Goals Walk without the cane, to be in and out a the tub, to golf again,    Currently in Pain? No/denies    Pain Onset 1  to 4 weeks ago                               [facility] Adult PT Treatment/Exercise - 09/20/21 0001       Exercises   Exercises Knee/Hip      Knee/Hip Exercises: Stretches   Gastroc Stretch Limitations slant board stretch x 30" x 3n      Knee/Hip Exercises: Aerobic   Nustep x7 hills level 3      Knee/Hip Exercises: Standing   Heel Raises Both;15 reps    Heel Raises Limitations toe raises x 15    Forward Lunges Left;5 reps    Lateral Step Up Left;15 reps    Forward Step Up Left;Hand Hold: 1;Step Height: 6"    Functional Squat 15 reps    SLS x3    Other Standing Knee Exercises hip extension forearms on counter hold 5" x 10 B x 2 rep    Other Standing Knee Exercises tandem stance on foam with head turns x 5 each      Knee/Hip Exercises: Seated   Sit to Sand 10 reps  PT Short Term Goals - 09/20/21 1701       PT SHORT TERM GOAL #1   Title PT to be I in his HEP to improve his LT LE strength to feel confident ambulation without an assistive device.    Time 3    Period Weeks    Status On-going    Target Date 09/29/21      PT SHORT TERM GOAL #2   Title Pt to be able to single leg stance for 20 seconds B to reduce risk of falling.    Time 3    Period Weeks    Status On-going               PT Long Term Goals - 09/20/21 1701       PT LONG TERM GOAL #1   Title PT to be I in an advanced HEP to allow pt to come sit to stand from low lying couches without the use of his UE>    Time 6    Status On-going      PT LONG TERM GOAL #2   Title Pt to have 115 degrees hip flexion to allow him to easily tie his shoes.    Time 6    Period Weeks    Status On-going      PT LONG TERM GOAL #3   Title PT to be able to go up and down 16 steps in a reciprocal manner using one handrail.    Time 6    Period Weeks    Status On-going      PT LONG TERM GOAL #4   Title Pt to be able to single leg stance for 30 seconds B to reduce  risk of falling and allow pt to feel comfortable walking on uneven surfaces.    Time 6    Period Weeks    Status On-going      PT LONG TERM GOAL #5   Title PT to no longer be experiencing any pain in his left hip    Time 6    Period Weeks    Status On-going             Plan:   Pt continues to improve in his balance and activity tolerance.  Addded step up, lunges without difficulty. Continued to focus on strengthening glut max strength.   Avoid active abduction until 2/28:  rocker board, step ups , lateral step ups, lunges, marching, progressing functional strengthening. Continue with STS transfers focusing on no UE use.  Patient will benefit from skilled therapeutic intervention in order to improve the following deficits and impairments:      HEP:  Vissit to stand using UE, heel raises, mini squats, single leg stance. Muscle weakness (generalized)  Pain in left hip     Problem List Patient Active Problem List   Diagnosis Date Noted   S/P hip replacement, left 08/24/21 09/02/2021   Osteoarthritis of left hip 08/24/2021   Unilateral primary osteoarthritis, left hip    Chronic constipation 07/27/2021   Elevated BP without diagnosis of hypertension 07/27/2021   OA (osteoarthritis) of ankle 09/26/2012   Pain in joint, ankle and foot 09/26/2012   Rayetta Humphrey, PT CLT [phone] 09/20/2021, 5:02 PM  [place] [address] [place], [place], [address] Phone: [phone]   Fax:  [phone]  Name: Dillon [name] MRN: [id number] Date of Birth: [birth year]

## 2021-09-22 ENCOUNTER — Other Ambulatory Visit: Payer: Self-pay

## 2021-09-22 ENCOUNTER — Ambulatory Visit (HOSPITAL_COMMUNITY): Payer: BC Managed Care – PPO | Admitting: Physical Therapy

## 2021-09-22 DIAGNOSIS — M6281 Muscle weakness (generalized): Secondary | ICD-10-CM | POA: Diagnosis not present

## 2021-09-22 NOTE — Therapy (Signed)
[place], [place], [address] Phone: [phone]   Fax:  [phone]  Physical Therapy Treatment  Patient Details  Name: Dillon [name] MRN: [id number] Date of Birth: 05/27/1956 Referring Provider (PT): Arther Abbott   Encounter Date: 09/22/2021   PT End of Session - 09/22/21 1612     Visit Number 5    Number of Visits 12    Date for PT Re-Evaluation 10/20/21    Authorization Type bcbs    PT Start Time 1536    PT Stop Time 1618    PT Time Calculation (min) 42 min    Activity Tolerance Patient tolerated treatment well    Behavior During Therapy [facility] for tasks assessed/performed             Past Medical History:  Diagnosis Date   Hypertension    Lumbar spondylosis     Past Surgical History:  Procedure Laterality Date   COLONOSCOPY N/A 01/24/2019   Procedure: COLONOSCOPY;  Surgeon: [name], MD;  [facility];  Service: Endoscopy;  Laterality: N/A;  1030   HAND SURGERY Left    closed reduction left hand   JOINT REPLACEMENT Right    Ankle replacement   KNEE SURGERY     POLYPECTOMY  01/24/2019   Procedure: POLYPECTOMY;  Surgeon: [name], MD;  [facility];  Service: Endoscopy;;  colon   TOTAL HIP ARTHROPLASTY Left 08/24/2021   Procedure: TOTAL HIP ARTHROPLASTY;  Surgeon: [name], MD;  Location: [facility];  Service: Orthopedics;  Laterality: Left;   WRIST FRACTURE SURGERY      There were no vitals filed for this visit.   Subjective Assessment - 09/22/21 1541     Subjective Pt states that he walked fast today when he was walking, he has no pain and is going to see the MD on the 26th.    Pertinent History RT ankle replacement    Limitations Walking    How long can you walk comfortably? Pt is walking about 500 yards at a time without his cane ; walks with a cane for the rest of the mile    Patient Stated Goals Walk without the cane, to be in and out  a the tub, to golf again,    Currently in Pain? No/denies                               [facility] Adult PT Treatment/Exercise - 09/22/21 0001       Exercises   Exercises Knee/Hip      Knee/Hip Exercises: Stretches   Gastroc Stretch Limitations slant board stretch x 30" x 3n      Knee/Hip Exercises: Standing   Heel Raises Both;15 reps    Heel Raises Limitations toe raises x 15    Forward Lunges Both;15 reps    Lateral Step Up Left;15 reps;Hand Hold: 1;Step Height: 6"    Forward Step Up Left;Hand Hold: 1;Step Height: 6";15 reps    Functional Squat 15 reps    Other Standing Knee Exercises hip extension forearms on counter hold 5" x 15 B x with 3# wt      Knee/Hip Exercises: Seated   Sit to Sand 10 reps                 Balance Exercises - 09/22/21 0001       Balance Exercises: Standing  SLS --    SLS with Vectors Solid surface;3 reps;10 secs    Partial Tandem Stance Eyes open;3 reps;20 secs    Marching Solid surface;Static;10 reps                  PT Short Term Goals - 09/20/21 1701       PT SHORT TERM GOAL #1   Title PT to be I in his HEP to improve his LT LE strength to feel confident ambulation without an assistive device.    Time 3    Period Weeks    Status On-going    Target Date 09/29/21      PT SHORT TERM GOAL #2   Title Pt to be able to single leg stance for 20 seconds B to reduce risk of falling.    Time 3    Period Weeks    Status On-going               PT Long Term Goals - 09/20/21 1701       PT LONG TERM GOAL #1   Title PT to be I in an advanced HEP to allow pt to come sit to stand from low lying couches without the use of his UE>    Time 6    Status On-going      PT LONG TERM GOAL #2   Title Pt to have 115 degrees hip flexion to allow him to easily tie his shoes.    Time 6    Period Weeks    Status On-going      PT LONG TERM GOAL #3   Title PT to be able to go up and down 16 steps in a reciprocal  manner using one handrail.    Time 6    Period Weeks    Status On-going      PT LONG TERM GOAL #4   Title Pt to be able to single leg stance for 30 seconds B to reduce risk of falling and allow pt to feel comfortable walking on uneven surfaces.    Time 6    Period Weeks    Status On-going      PT LONG TERM GOAL #5   Title PT to no longer be experiencing any pain in his left hip    Time 6    Period Weeks    Status On-going                   Plan - 09/22/21 1613     Clinical Impression Statement Added marching to program to work both core and LE strengthening. Added 3# to hip extension.    Examination-Activity Limitations Bed Mobility;Dressing;Lift;Stairs;Squat    Examination-Participation Restrictions Cleaning;Community Activity    Stability/Clinical Decision Making Stable/Uncomplicated    Rehab Potential Good    PT Frequency 2x / week    PT Duration 6 weeks    PT Treatment/Interventions Balance training;Stair training;Gait training;Therapeutic exercise;Manual techniques;Patient/family education    PT Next Visit Plan Avoid active abduction until 2/28:  rocker board, step ups , lateral step ups, lunges, marching, progressing functional strengthening. Continue with STS transfers focusing on no UE use.    PT Home Exercise Plan sit to stand using UE, heel raises, mini squats, single leg stance.             Patient will benefit from skilled therapeutic intervention in order to improve the following deficits and impairments:  Pain, Decreased strength, Decreased activity tolerance, Decreased range  of motion, Difficulty walking  Visit Diagnosis: Muscle weakness (generalized)     Problem List Patient Active Problem List   Diagnosis Date Noted   S/P hip replacement, left 08/24/21 09/02/2021   Osteoarthritis of left hip 08/24/2021   Unilateral primary osteoarthritis, left hip    Chronic constipation 07/27/2021   Elevated BP without diagnosis of hypertension 07/27/2021    OA (osteoarthritis) of ankle 09/26/2012   Pain in joint, ankle and foot 09/26/2012   Rayetta Humphrey, PT CLT [phone]  09/22/2021, 4:18 PM  [place] [address] [place], [place], [address] Phone: [phone]   Fax:  [phone]  Name: NOHLAN BURDIN MRN: [id number] Date of Birth: 1956/01/06

## 2021-09-24 ENCOUNTER — Encounter (HOSPITAL_COMMUNITY): Payer: BC Managed Care – PPO | Admitting: Physical Therapy

## 2021-09-27 ENCOUNTER — Ambulatory Visit (HOSPITAL_COMMUNITY): Payer: BC Managed Care – PPO | Admitting: Physical Therapy

## 2021-09-27 ENCOUNTER — Other Ambulatory Visit: Payer: Self-pay

## 2021-09-27 DIAGNOSIS — M6281 Muscle weakness (generalized): Secondary | ICD-10-CM

## 2021-09-27 DIAGNOSIS — R262 Difficulty in walking, not elsewhere classified: Secondary | ICD-10-CM

## 2021-09-27 DIAGNOSIS — M25552 Pain in left hip: Secondary | ICD-10-CM

## 2021-09-27 NOTE — Therapy (Signed)
[place], [place], [address] Phone: [phone]   Fax:  [phone]  Physical Therapy Treatment  Patient Details  Name: Dillon [name] MRN: [id number] Date of Birth: 1955/12/13 Referring Provider (PT): Arther Abbott   Encounter Date: 09/27/2021   PT End of Session - 09/27/21 1434     Visit Number 6    Number of Visits 12    Date for PT Re-Evaluation 10/20/21    Authorization Type bcbs    PT Start Time 1429    PT Stop Time 1507    PT Time Calculation (min) 38 min    Activity Tolerance Patient tolerated treatment well    Behavior During Therapy [facility] for tasks assessed/performed             Past Medical History:  Diagnosis Date   Hypertension    Lumbar spondylosis     Past Surgical History:  Procedure Laterality Date   COLONOSCOPY N/A 01/24/2019   Procedure: COLONOSCOPY;  Surgeon: [name], MD;  [facility];  Service: Endoscopy;  Laterality: N/A;  1030   HAND SURGERY Left    closed reduction left hand   JOINT REPLACEMENT Right    Ankle replacement   KNEE SURGERY     POLYPECTOMY  01/24/2019   Procedure: POLYPECTOMY;  Surgeon: [name], MD;  [facility];  Service: Endoscopy;;  colon   TOTAL HIP ARTHROPLASTY Left 08/24/2021   Procedure: TOTAL HIP ARTHROPLASTY;  Surgeon: [name], MD;  Location: [facility];  Service: Orthopedics;  Laterality: Left;   WRIST FRACTURE SURGERY      There were no vitals filed for this visit.   Subjective Assessment - 09/27/21 1431     Subjective Patient reports that he was able to get 7,500 steps in yesterday between 3 different walks. He states that the exercises performed during the last session caused his Lt knee joint to be sore.    Pertinent History RT ankle replacement    Limitations Walking    How long can you walk comfortably? Pt is walking about 500 yards at a time without his cane ; walks with a cane for the  rest of the mile    Patient Stated Goals Walk without the cane, to be in and out a the tub, to golf again,    Currently in Pain? No/denies                               [facility] Adult PT Treatment/Exercise - 09/27/21 0001       Knee/Hip Exercises: Aerobic   Nustep x31min(lvl3 60-80spm)      Knee/Hip Exercises: Standing   Other Standing Knee Exercises Bent over knee drives #2 3x8, Bent over glute punches #3 3x8      Knee/Hip Exercises: Seated   Other Seated Knee/Hip Exercises Balance board STS with Lt side block 3x12      Knee/Hip Exercises: Sidelying   Other Sidelying Knee/Hip Exercises Bent knee hip abd 3x10                       PT Short Term Goals - 09/20/21 1701       PT SHORT TERM GOAL #1   Title PT to be I in his HEP to improve his LT LE strength to feel confident ambulation without an assistive device.    Time 3  Period Weeks    Status On-going    Target Date 09/29/21      PT SHORT TERM GOAL #2   Title Pt to be able to single leg stance for 20 seconds B to reduce risk of falling.    Time 3    Period Weeks    Status On-going               PT Long Term Goals - 09/20/21 1701       PT LONG TERM GOAL #1   Title PT to be I in an advanced HEP to allow pt to come sit to stand from low lying couches without the use of his UE>    Time 6    Status On-going      PT LONG TERM GOAL #2   Title Pt to have 115 degrees hip flexion to allow him to easily tie his shoes.    Time 6    Period Weeks    Status On-going      PT LONG TERM GOAL #3   Title PT to be able to go up and down 16 steps in a reciprocal manner using one handrail.    Time 6    Period Weeks    Status On-going      PT LONG TERM GOAL #4   Title Pt to be able to single leg stance for 30 seconds B to reduce risk of falling and allow pt to feel comfortable walking on uneven surfaces.    Time 6    Period Weeks    Status On-going      PT LONG TERM GOAL #5   Title PT to  no longer be experiencing any pain in his left hip    Time 6    Period Weeks    Status On-going                   Plan - 09/27/21 1516     Clinical Impression Statement Patient tolerated today's treatment well and low level(bent knee) sidelying hip abduction was added to the session. Hip IR control was lacking by the last set as a level tibia was not able to be maintained although hip abduction was still possible. Marked compensated trendelenburg gait is still present during ambulation. Increased Rt knee valgus is noted and is most likely due to previous meniscectomy.    Examination-Activity Limitations Bed Mobility;Dressing;Lift;Stairs;Squat    Examination-Participation Restrictions Cleaning;Community Activity    Stability/Clinical Decision Making Stable/Uncomplicated    Rehab Potential Good    PT Frequency 2x / week    PT Duration 6 weeks    PT Treatment/Interventions Balance training;Stair training;Gait training;Therapeutic exercise;Manual techniques;Patient/family education    PT Next Visit Plan Avoid active abduction until 2/28:  rocker board, step ups , lateral step ups, lunges, marching, progressing functional strengthening. Continue with STS transfers focusing on no UE use.    PT Home Exercise Plan sit to stand using UE, heel raises, mini squats, single leg stance.             Patient will benefit from skilled therapeutic intervention in order to improve the following deficits and impairments:  Pain, Decreased strength, Decreased activity tolerance, Decreased range of motion, Difficulty walking  Visit Diagnosis: Muscle weakness (generalized)  Pain in left hip  Difficulty in walking, not elsewhere classified     Problem List Patient Active Problem List   Diagnosis Date Noted   S/P hip replacement, left 08/24/21 09/02/2021  Osteoarthritis of left hip 08/24/2021   Unilateral primary osteoarthritis, left hip    Chronic constipation 07/27/2021   Elevated BP  without diagnosis of hypertension 07/27/2021   OA (osteoarthritis) of ankle 09/26/2012   Pain in joint, ankle and foot 09/26/2012    Adalberto Cole, PT 09/27/2021, 3:22 PM  [place], [place], [address] Phone: [phone]   Fax:  [phone]  Name: Dillon [name] MRN: [id number] Date of Birth: 1955-10-05

## 2021-09-29 ENCOUNTER — Ambulatory Visit (HOSPITAL_COMMUNITY): Payer: BC Managed Care – PPO

## 2021-09-29 ENCOUNTER — Other Ambulatory Visit: Payer: Self-pay

## 2021-09-29 ENCOUNTER — Encounter (HOSPITAL_COMMUNITY): Payer: Self-pay

## 2021-09-29 DIAGNOSIS — M25552 Pain in left hip: Secondary | ICD-10-CM

## 2021-09-29 DIAGNOSIS — R262 Difficulty in walking, not elsewhere classified: Secondary | ICD-10-CM

## 2021-09-29 DIAGNOSIS — M6281 Muscle weakness (generalized): Secondary | ICD-10-CM

## 2021-09-29 NOTE — Therapy (Signed)
[name], [place], [address] Phone: [phone]   Fax:  [phone]  Physical Therapy Treatment  Patient Details  Name: Dillon [name] MRN: [id number] Date of Birth: [birth year] Referring Provider (PT): Arther Abbott   Encounter Date: 09/29/2021   PT End of Session - 09/29/21 1702     Visit Number 7    Number of Visits 12    Date for PT Re-Evaluation 10/20/21    Authorization Type bcbs    PT Start Time 1617    PT Stop Time 1655    PT Time Calculation (min) 38 min    Activity Tolerance Patient tolerated treatment well    Behavior During Therapy [facility] for tasks assessed/performed             Past Medical History:  Diagnosis Date   Hypertension    Lumbar spondylosis     Past Surgical History:  Procedure Laterality Date   COLONOSCOPY N/A 01/24/2019   Procedure: COLONOSCOPY;  Surgeon: [name], MD;  [facility];  Service: Endoscopy;  Laterality: N/A;  1030   HAND SURGERY Left    closed reduction left hand   JOINT REPLACEMENT Right    Ankle replacement   KNEE SURGERY     POLYPECTOMY  01/24/2019   Procedure: POLYPECTOMY;  Surgeon: [name], MD;  [facility];  Service: Endoscopy;;  colon   TOTAL HIP ARTHROPLASTY Left 08/24/2021   Procedure: TOTAL HIP ARTHROPLASTY;  Surgeon: [name], MD;  Location: [facility];  Service: Orthopedics;  Laterality: Left;   WRIST FRACTURE SURGERY      There were no vitals filed for this visit.   Subjective Assessment - 09/29/21 1624     Subjective Feeling good, no reports of pain.  Has been on feet a lot today.                [facility] PT Assessment - 09/29/21 0001       Assessment   Medical Diagnosis Lt THR    Referring Provider (PT) Arther Abbott    Onset Date/Surgical Date 08/24/21    Next MD Visit 10/03/2021    Prior Therapy HH      Precautions   Precautions Anterior Hip   surgeon detaches hip abductors  request no strengthening for 6 weeks post.     Single Leg Stance   Comments Max of 5 Bil 3-5" top      Sit to Stand   Comments 5 STS 10.17" standard height no HHA      AROM   AROM Assessment Site Hip    Right/Left Hip Left    Left Hip Flexion 106   was 50     Standardized Balance Assessment   Standardized Balance Assessment Five Times Sit to Stand    Five times sit to stand comments  10.17"                           [facility] Adult PT Treatment/Exercise - 09/29/21 0001       Ambulation/Gait   Ambulation Distance (Feet) 520 Feet    Assistive device None    Gait Pattern Within Functional Limits    Stairs Yes    Stairs Assistance 7: Independent    Stair Management Technique Alternating pattern;One rail Right    Number of Stairs 16    Height of Stairs 7    Gait Comments 2MWT  Knee/Hip Exercises: Standing   Heel Raises Both;15 reps    Rocker Board 2 minutes    SLS 5x 3-5"    SLS with Vectors Standing on Lt LE 5x 5"    Other Standing Knee Exercises tandem stance 3x 30", last set on foam      Knee/Hip Exercises: Seated   Sit to Sand 5 reps;without UE support   5 STS 10.17"                Balance Exercises - 09/29/21 0001       Balance Exercises: Standing   Marching Solid surface;Static;10 reps                  PT Short Term Goals - 09/29/21 1625       PT SHORT TERM GOAL #1   Title PT to be I in his HEP to improve his LT LE strength to feel confident ambulation without an assistive device.    Baseline 09/29/21:  Reports complaince wiht HEP multiple time a week.  Reports ability to go for walk wihtout AD    Status Achieved      PT SHORT TERM GOAL #2   Title Pt to be able to single leg stance for 20 seconds B to reduce risk of falling.    Baseline 09/29/21: 3-5" max    Status On-going               PT Long Term Goals - 09/29/21 1633       PT LONG TERM GOAL #1   Title PT to be I in an advanced HEP to allow pt to come sit  to stand from low lying couches without the use of his UE>    Baseline 09/29/21:  Depends upon hands when knees above hips, able to stand without HHA from neutral or higher    Status Partially Met      PT LONG TERM GOAL #2   Title Pt to have 115 degrees hip flexion to allow him to easily tie his shoes.    Baseline 09/29/21:  Reports ability to tie shoes without difficulty      PT LONG TERM GOAL #3   Title PT to be able to go up and down 16 steps in a reciprocal manner using one handrail.    Baseline 09/30/20: Able to demonstrate reciprocal manner with no HR ascending, 1 HR descending no difficulty    Status Achieved      PT LONG TERM GOAL #4   Title Pt to be able to single leg stance for 30 seconds B to reduce risk of falling and allow pt to feel comfortable walking on uneven surfaces.      PT LONG TERM GOAL #5   Title PT to no longer be experiencing any pain in his left hip    Baseline 09/29/21: Continues to surgical pain where incision is, no joint pain.    Status On-going                   Plan - 09/29/21 1736     Clinical Impression Statement Reviewed goals prior MD apt on Monday.  Pt is progressing well towards ability to ambulate no AD with increased cadence though does continues to have antaglic gait mechanics.  Pt able to complete reciprocal pattern with stairs safely with 1 HR while descending wiht good control.  Pt able to complete 5 STS in 10.17" with no HHA, does report need for hand assistance  from lower surface.  Main impairments noted are balance and antaglic gait mechanics.    Examination-Activity Limitations Bed Mobility;Dressing;Lift;Stairs;Squat    Examination-Participation Restrictions Cleaning;Community Activity    Stability/Clinical Decision Making Stable/Uncomplicated    Clinical Decision Making Moderate    Rehab Potential Good    PT Frequency 2x / week    PT Duration 6 weeks    PT Treatment/Interventions Balance training;Stair training;Gait  training;Therapeutic exercise;Manual techniques;Patient/family education    PT Next Visit Plan F/U with MD apt on 10/03/21.  Avoid active abduction until 2/28:  rocker board, step ups , lateral step ups, lunges, marching, progressing functional strengthening. Continue with STS transfers focusing on no UE use.    PT Home Exercise Plan sit to stand using UE, heel raises, mini squats, single leg stance.    Consulted and Agree with Plan of Care Patient             Patient will benefit from skilled therapeutic intervention in order to improve the following deficits and impairments:  Pain, Decreased strength, Decreased activity tolerance, Decreased range of motion, Difficulty walking  Visit Diagnosis: Muscle weakness (generalized)  Pain in left hip  Difficulty in walking, not elsewhere classified     Problem List Patient Active Problem List   Diagnosis Date Noted   S/P hip replacement, left 08/24/21 09/02/2021   Osteoarthritis of left hip 08/24/2021   Unilateral primary osteoarthritis, left hip    Chronic constipation 07/27/2021   Elevated BP without diagnosis of hypertension 07/27/2021   OA (osteoarthritis) of ankle 09/26/2012   Pain in joint, ankle and foot 09/26/2012   Ihor Austin, LPTA/CLT; CBIS [phone]  Aldona Lento, PTA 09/29/2021, 6:50 PM  [place] [address] [place], [place], [address] Phone: [phone]   Fax:  [phone]  Name: AXIL COPEMAN MRN: [id number] Date of Birth: 08/19/1955

## 2021-10-01 ENCOUNTER — Encounter (HOSPITAL_COMMUNITY): Payer: BC Managed Care – PPO | Admitting: Physical Therapy

## 2021-10-04 ENCOUNTER — Ambulatory Visit (INDEPENDENT_AMBULATORY_CARE_PROVIDER_SITE_OTHER): Payer: BC Managed Care – PPO | Admitting: Orthopedic Surgery

## 2021-10-04 ENCOUNTER — Other Ambulatory Visit: Payer: Self-pay

## 2021-10-04 ENCOUNTER — Encounter: Payer: Self-pay | Admitting: Orthopedic Surgery

## 2021-10-04 ENCOUNTER — Ambulatory Visit (HOSPITAL_COMMUNITY): Payer: BC Managed Care – PPO | Admitting: Physical Therapy

## 2021-10-04 DIAGNOSIS — M6281 Muscle weakness (generalized): Secondary | ICD-10-CM

## 2021-10-04 DIAGNOSIS — R262 Difficulty in walking, not elsewhere classified: Secondary | ICD-10-CM

## 2021-10-04 DIAGNOSIS — Z96642 Presence of left artificial hip joint: Secondary | ICD-10-CM

## 2021-10-04 DIAGNOSIS — M25552 Pain in left hip: Secondary | ICD-10-CM

## 2021-10-04 NOTE — Progress Notes (Signed)
Postop appointment  Left total hip August 24, 2021  DePuy Tri-Lock high offset stem size 7 GRIPTION 58 cup 2 screws 58+4 poly and a 36 x 8.5 ceramic head.  Patient is doing well has progressed well in physical therapy working without any support  He says he has no pain he walks 7-9,000 steps a day  He has a right valgus knee he has a right total ankle  His leg lengths are within 2 to 3 mm of equal with the right still slightly longer  We will see him in January 2024 for his 1 year follow-up x-ray

## 2021-10-04 NOTE — Therapy (Signed)
[place], [place], [address] Phone: [phone]   Fax:  [phone]  Physical Therapy Treatment  Patient Details  Name: Dillon [name] MRN: [id number] Date of Birth: [birth year] Referring Provider (PT): Arther Abbott   Encounter Date: 10/04/2021   PT End of Session - 10/04/21 1342     Visit Number 8    Number of Visits 12    Date for PT Re-Evaluation 10/20/21    Authorization Type bcbs    PT Start Time 1400    PT Stop Time 1440    PT Time Calculation (min) 40 min    Activity Tolerance Patient tolerated treatment well    Behavior During Therapy [facility] for tasks assessed/performed             Past Medical History:  Diagnosis Date   Hypertension    Lumbar spondylosis     Past Surgical History:  Procedure Laterality Date   COLONOSCOPY N/A 01/24/2019   Procedure: COLONOSCOPY;  Surgeon: [name], MD;  [facility];  Service: Endoscopy;  Laterality: N/A;  1030   HAND SURGERY Left    closed reduction left hand   JOINT REPLACEMENT Right    Ankle replacement   KNEE SURGERY     POLYPECTOMY  01/24/2019   Procedure: POLYPECTOMY;  Surgeon: [name], MD;  [facility];  Service: Endoscopy;;  colon   TOTAL HIP ARTHROPLASTY Left 08/24/2021   Procedure: TOTAL HIP ARTHROPLASTY;  Surgeon: [name], MD;  Location: [facility];  Service: Orthopedics;  Laterality: Left;   WRIST FRACTURE SURGERY      There were no vitals filed for this visit.   Subjective Assessment - 10/04/21 1341     Subjective PT states that the MD was pleased with his progress ; no pain    Pertinent History RT ankle replacement    Limitations Walking    How long can you walk comfortably? Pt is walking about 500 yards at a time without his cane ; walks with a cane for the rest of the mile    Patient Stated Goals Walk without the cane, to be in and out a the tub, to golf again,    Currently in Pain?  No/denies                               [facility] Adult PT Treatment/Exercise - 10/04/21 0001       Exercises   Exercises Knee/Hip      Knee/Hip Exercises: Standing   Heel Raises Both;15 reps    Heel Raises Limitations toe raises x 15    Forward Lunges Both;15 reps    Lateral Step Up Left;15 reps;Hand Hold: 1;Step Height: 6"    Forward Step Up Left;Hand Hold: 1;Step Height: 6";15 reps    Step Down Left;15 reps    Functional Squat 15 reps    SLS with Vectors 5 x 5 " B    Other Standing Knee Exercises side steppingx 2 RT; hip abduction x 15 B w/ 3#    Other Standing Knee Exercises maraching x 10                       PT Short Term Goals - 09/29/21 1625       PT SHORT TERM GOAL #1   Title PT to be I in his HEP to improve  his LT LE strength to feel confident ambulation without an assistive device.    Baseline 09/29/21:  Reports complaince wiht HEP multiple time a week.  Reports ability to go for walk wihtout AD    Status Achieved      PT SHORT TERM GOAL #2   Title Pt to be able to single leg stance for 20 seconds B to reduce risk of falling.    Baseline 09/29/21: 3-5" max    Status On-going               PT Long Term Goals - 09/29/21 1633       PT LONG TERM GOAL #1   Title PT to be I in an advanced HEP to allow pt to come sit to stand from low lying couches without the use of his UE>    Baseline 09/29/21:  Depends upon hands when knees above hips, able to stand without HHA from neutral or higher    Status Partially Met      PT LONG TERM GOAL #2   Title Pt to have 115 degrees hip flexion to allow him to easily tie his shoes.    Baseline 09/29/21:  Reports ability to tie shoes without difficulty      PT LONG TERM GOAL #3   Title PT to be able to go up and down 16 steps in a reciprocal manner using one handrail.    Baseline 09/30/20: Able to demonstrate reciprocal manner with no HR ascending, 1 HR descending no difficulty    Status Achieved       PT LONG TERM GOAL #4   Title Pt to be able to single leg stance for 30 seconds B to reduce risk of falling and allow pt to feel comfortable walking on uneven surfaces.      PT LONG TERM GOAL #5   Title PT to no longer be experiencing any pain in his left hip    Baseline 09/29/21: Continues to surgical pain where incision is, no joint pain.    Status On-going                   Plan - 10/04/21 1342     Clinical Impression Statement Therapist advanced pt to active hip abduction today with no complaint of pain.  Pt's main deficit continues to be balance.  Pt will continue to benefit from skilled PT to maximize balance ability.    Examination-Activity Limitations Bed Mobility;Dressing;Lift;Stairs;Squat    Examination-Participation Restrictions Cleaning;Community Activity    Stability/Clinical Decision Making Stable/Uncomplicated    Rehab Potential Good    PT Frequency 2x / week    PT Duration 6 weeks    PT Treatment/Interventions Balance training;Stair training;Gait training;Therapeutic exercise;Manual techniques;Patient/family education    PT Next Visit Plan F/U with MD apt on 10/03/21.  Avoid active abduction until 2/28:  rocker board, step ups , lateral step ups, lunges, marching, progressing functional strengthening. Continue with STS transfers focusing on no UE use.    PT Home Exercise Plan sit to stand using UE, heel raises, mini squats, single leg stance.; 2/27:  lunge, march, vector stance    Consulted and Agree with Plan of Care Patient             Patient will benefit from skilled therapeutic intervention in order to improve the following deficits and impairments:  Pain, Decreased strength, Decreased activity tolerance, Decreased range of motion, Difficulty walking  Visit Diagnosis: Muscle weakness (generalized)  Pain in left hip  Difficulty in walking, not elsewhere classified     Problem List Patient Active Problem List   Diagnosis Date Noted   S/P hip  replacement, left 08/24/21 09/02/2021   Osteoarthritis of left hip 08/24/2021   Unilateral primary osteoarthritis, left hip    Chronic constipation 07/27/2021   Elevated BP without diagnosis of hypertension 07/27/2021   OA (osteoarthritis) of ankle 09/26/2012   Pain in joint, ankle and foot 09/26/2012   Rayetta Humphrey, PT CLT [phone]  10/04/2021, 2:45 PM  [place], [place], [address] Phone: [phone]   Fax:  [phone]  Name: KLAY SOBOTKA MRN: [id number] Date of Birth: [birth year]

## 2021-10-05 ENCOUNTER — Ambulatory Visit (HOSPITAL_COMMUNITY): Payer: BC Managed Care – PPO | Admitting: Physical Therapy

## 2021-10-05 DIAGNOSIS — M25552 Pain in left hip: Secondary | ICD-10-CM

## 2021-10-05 DIAGNOSIS — M6281 Muscle weakness (generalized): Secondary | ICD-10-CM | POA: Diagnosis not present

## 2021-10-05 DIAGNOSIS — R262 Difficulty in walking, not elsewhere classified: Secondary | ICD-10-CM

## 2021-10-05 NOTE — Therapy (Signed)
[place], [place], [address] Phone: [phone]   Fax:  [phone]  Physical Therapy Treatment  Patient Details  Name: Dillon [name] MRN: [id number] Date of Birth: [birth year] Referring Provider (PT): Arther Abbott   Encounter Date: 10/05/2021   PT End of Session - 10/05/21 1438     Visit Number 9    Number of Visits 12    Date for PT Re-Evaluation 10/20/21    Authorization Type bcbs    PT Start Time 1433    PT Stop Time 1513    PT Time Calculation (min) 40 min    Activity Tolerance Patient tolerated treatment well    Behavior During Therapy [facility] for tasks assessed/performed             Past Medical History:  Diagnosis Date   Hypertension    Lumbar spondylosis     Past Surgical History:  Procedure Laterality Date   COLONOSCOPY N/A 01/24/2019   Procedure: COLONOSCOPY;  Surgeon: [name], MD;  [facility];  Service: Endoscopy;  Laterality: N/A;  1030   HAND SURGERY Left    closed reduction left hand   JOINT REPLACEMENT Right    Ankle replacement   KNEE SURGERY     POLYPECTOMY  01/24/2019   Procedure: POLYPECTOMY;  Surgeon: [name], MD;  [facility];  Service: Endoscopy;;  colon   TOTAL HIP ARTHROPLASTY Left 08/24/2021   Procedure: TOTAL HIP ARTHROPLASTY;  Surgeon: [name], MD;  Location: [facility];  Service: Orthopedics;  Laterality: Left;   WRIST FRACTURE SURGERY      There were no vitals filed for this visit.   Subjective Assessment - 10/05/21 1438     Subjective Patient says his hip is fine. He notes his LT knee was bothering him yesterday. He took pain medication for it last night. Still sore a little today.    Pertinent History RT ankle replacement    Limitations Walking    How long can you walk comfortably? Pt is walking about 500 yards at a time without his cane ; walks with a cane for the rest of the mile    Patient Stated Goals  Walk without the cane, to be in and out a the tub, to golf again,    Currently in Pain? Yes    Pain Score 4     Pain Location Knee    Pain Orientation Left    Pain Descriptors / Indicators Aching;Sore                               [facility] Adult PT Treatment/Exercise - 10/05/21 0001       Knee/Hip Exercises: Standing   Heel Raises Both;15 reps;2 sets    Heel Raises Limitations toe raises 2 x 15    Forward Lunges Both;15 reps    Forward Lunges Limitations on 6 inch step    Hip Abduction Both;2 sets;15 reps    Abduction Limitations GTB    Hip Extension Both;2 sets;15 reps    Extension Limitations GTB    Forward Step Up Left;Hand Hold: 1;Step Height: 6";2 sets;10 reps    Step Down Left;2 sets;10 reps;Hand Hold: 1;Step Height: 6"    Stairs 7 inch 3 RT no rails, reciprocal    SLS 3 x 10" (7 sec max)    SLS with Vectors 5 x 5 " B  Gait Training 1 RT in clinic      Knee/Hip Exercises: Seated   Sit to Sand 2 sets;10 reps;without UE support                       PT Short Term Goals - 09/29/21 1625       PT SHORT TERM GOAL #1   Title PT to be I in his HEP to improve his LT LE strength to feel confident ambulation without an assistive device.    Baseline 09/29/21:  Reports complaince wiht HEP multiple time a week.  Reports ability to go for walk wihtout AD    Status Achieved      PT SHORT TERM GOAL #2   Title Pt to be able to single leg stance for 20 seconds B to reduce risk of falling.    Baseline 09/29/21: 3-5" max    Status On-going               PT Long Term Goals - 09/29/21 1633       PT LONG TERM GOAL #1   Title PT to be I in an advanced HEP to allow pt to come sit to stand from low lying couches without the use of his UE>    Baseline 09/29/21:  Depends upon hands when knees above hips, able to stand without HHA from neutral or higher    Status Partially Met      PT LONG TERM GOAL #2   Title Pt to have 115 degrees hip flexion to  allow him to easily tie his shoes.    Baseline 09/29/21:  Reports ability to tie shoes without difficulty      PT LONG TERM GOAL #3   Title PT to be able to go up and down 16 steps in a reciprocal manner using one handrail.    Baseline 09/30/20: Able to demonstrate reciprocal manner with no HR ascending, 1 HR descending no difficulty    Status Achieved      PT LONG TERM GOAL #4   Title Pt to be able to single leg stance for 30 seconds B to reduce risk of falling and allow pt to feel comfortable walking on uneven surfaces.      PT LONG TERM GOAL #5   Title PT to no longer be experiencing any pain in his left hip    Baseline 09/29/21: Continues to surgical pain where incision is, no joint pain.    Status On-going                   Plan - 10/05/21 1511     Clinical Impression Statement Patient progressing well. Showing improved mobility and stretching. Progressed hip abduction and extension strength with added band resistance. Patient continues to demo slight limitation with LLE static balance. Good tolerance to todays activity. Issued HEP handout. Patient will continue to benefit from skilled therapy services to reduce deficits and improve functional level.    Examination-Activity Limitations Bed Mobility;Dressing;Lift;Stairs;Squat    Examination-Participation Restrictions Cleaning;Community Activity    Stability/Clinical Decision Making Stable/Uncomplicated    Rehab Potential Good    PT Frequency 2x / week    PT Duration 6 weeks    PT Treatment/Interventions Balance training;Stair training;Gait training;Therapeutic exercise;Manual techniques;Patient/family education    PT Next Visit Plan rocker board, step ups , lateral step ups, lunges, marching, progressing functional strengthening. Continue with STS transfers focusing on no UE use.    PT Home Exercise Plan  sit to stand using UE, heel raises, mini squats, single leg stance.; 2/27:  lunge, march, vector stance    Consulted and  Agree with Plan of Care Patient             Patient will benefit from skilled therapeutic intervention in order to improve the following deficits and impairments:  Pain, Decreased strength, Decreased activity tolerance, Decreased range of motion, Difficulty walking  Visit Diagnosis: Muscle weakness (generalized)  Pain in left hip  Difficulty in walking, not elsewhere classified     Problem List Patient Active Problem List   Diagnosis Date Noted   S/P hip replacement, left 08/24/21 09/02/2021   Osteoarthritis of left hip 08/24/2021   Unilateral primary osteoarthritis, left hip    Chronic constipation 07/27/2021   Elevated BP without diagnosis of hypertension 07/27/2021   OA (osteoarthritis) of ankle 09/26/2012   Pain in joint, ankle and foot 09/26/2012   3:15 PM, 10/05/21 Josue Hector PT DPT  Physical Therapist with [facility]  [phone] [place], [place], [address] Phone: [phone]   Fax:  [phone]  Name: Dillon [name] MRN: [id number] Date of Birth: 04-15-1956

## 2021-10-05 NOTE — Patient Instructions (Signed)
Access Code: [email or web address] URL: [email or web address] Date: 10/05/2021 Prepared by: Josue Hector  Exercises Hip Abduction with Resistance Loop - 2 x daily - 7 x weekly - 2 sets - 10 reps Hip Extension with Resistance Loop - 2 x daily - 7 x weekly - 2 sets - 10 reps Single Leg Stance with Support - 2 x daily - 7 x weekly - 1 sets - 4 reps - 15 second hold

## 2021-10-11 ENCOUNTER — Ambulatory Visit (HOSPITAL_COMMUNITY): Payer: BC Managed Care – PPO | Attending: Orthopedic Surgery

## 2021-10-11 ENCOUNTER — Other Ambulatory Visit: Payer: Self-pay

## 2021-10-11 DIAGNOSIS — R262 Difficulty in walking, not elsewhere classified: Secondary | ICD-10-CM | POA: Diagnosis present

## 2021-10-11 DIAGNOSIS — M25552 Pain in left hip: Secondary | ICD-10-CM | POA: Diagnosis present

## 2021-10-11 DIAGNOSIS — M6281 Muscle weakness (generalized): Secondary | ICD-10-CM | POA: Insufficient documentation

## 2021-10-11 NOTE — Therapy (Signed)
?[facility] ?[address] ?[place], [place], [address] ?Phone: [phone]   Fax:  [phone] ? ?Physical Therapy Treatment ? ?Patient Details  ?Name: Dillon [name] ?MRN: [id number] ?Date of Birth: [birth year] ?Referring Provider (PT): Arther Abbott ? ? ?Encounter Date: 10/11/2021 ? ? PT End of Session - 10/11/21 1439   ? ? Visit Number 10   ? Number of Visits 12   ? Date for PT Re-Evaluation 10/20/21   ? Authorization Type bcbs   ? PT Start Time 3329   ? PT Stop Time 1520   ? PT Time Calculation (min) 41 min   ? Equipment Utilized During Treatment Gait belt   ? Activity Tolerance Patient tolerated treatment well   ? Behavior During Therapy [facility] for tasks assessed/performed   ? ?  ?  ? ?  ? ? ?Past Medical History:  ?Diagnosis Date  ? Hypertension   ? Lumbar spondylosis   ? ? ?Past Surgical History:  ?Procedure Laterality Date  ? COLONOSCOPY N/A 01/24/2019  ? Procedure: COLONOSCOPY;  Surgeon: [name], MD;  [facility];  Service: Endoscopy;  Laterality: N/A;  1030  ? HAND SURGERY Left   ? closed reduction left hand  ? JOINT REPLACEMENT Right   ? Ankle replacement  ? KNEE SURGERY    ? POLYPECTOMY  01/24/2019  ? Procedure: POLYPECTOMY;  Surgeon: [name], MD;  [facility];  Service: Endoscopy;;  colon  ? TOTAL HIP ARTHROPLASTY Left 08/24/2021  ? Procedure: TOTAL HIP ARTHROPLASTY;  Surgeon: [name], MD;  Location: [facility];  Service: Orthopedics;  Laterality: Left;  ? WRIST FRACTURE SURGERY    ? ? ?There were no vitals filed for this visit. ? ? Subjective Assessment - 10/11/21 1439   ? ? Subjective L leg is swelling some today.  He reports his hip is feeling good. He feels that returning to work has probably caused his swelling; he just returned to work 3/1.  He mowed his grass last week and moved some furniture.   ? Pertinent History RT ankle replacement   ? Limitations Walking   ? How long can you walk comfortably? Pt is  walking about 500 yards at a time without his cane ; walks with a cane for the rest of the mile   ? Patient Stated Goals Walk without the cane, to be in and out a the tub, to golf again,   ? Pain Onset 1 to 4 weeks ago   ? ?  ?  ? ?  ? ? ? ? ? ? ? ? ? ? ? ? ? ? ? ? ? ? ? ? [facility] Adult PT Treatment/Exercise - 10/11/21 0001   ? ?  ? Knee/Hip Exercises: Stretches  ? Gastroc Stretch 20 seconds;5 reps   ? Gastroc Stretch Limitations slant board stretch x 20" x 5   ?  ? Knee/Hip Exercises: Standing  ? Heel Raises Both;15 reps;2 sets   ? Forward Lunges Both;15 reps   ? Lateral Step Up Left;15 reps;Hand Hold: 1;Step Height: 6"   ? Forward Step Up Left;Hand Hold: 0;Step Height: 6";15 reps   ? SLS with ball toss at trampoline 2 sets of 10; CGA for safety   1st set put opposite toe on the floor to assist  ? Walking with Sports Cord with orange thick theraband loop over cones x 3 x 3 passes with therapist holding cord for resistance   ?  Other Standing Knee Exercises tandem stance standing diagonals with yellow medicine ball; front foot on foam 2 sets of 10 each way.   ?  ? Knee/Hip Exercises: Seated  ? Sit to Sand 3 sets;10 reps   yellow medicine ball  ? ?  ?  ? ?  ? ? ? ? ? ? ? ? ? ? PT Education - 10/11/21 1523   ? ? Education Details PT discussed with patient elevating his Left lower extremity when he gets off work for at least 15 min to help decreased swelling.   ? Person(s) Educated Patient   ? Methods Explanation   ? Comprehension Verbalized understanding   ? ?  ?  ? ?  ? ? ? PT Short Term Goals - 09/29/21 1625   ? ?  ? PT SHORT TERM GOAL #1  ? Title PT to be I in his HEP to improve his LT LE strength to feel confident ambulation without an assistive device.   ? Baseline 09/29/21:  Reports complaince wiht HEP multiple time a week.  Reports ability to go for walk wihtout AD   ? Status Achieved   ?  ? PT SHORT TERM GOAL #2  ? Title Pt to be able to single leg stance for 20 seconds B to reduce risk of falling.   ? Baseline  09/29/21: 3-5" max   ? Status On-going   ? ?  ?  ? ?  ? ? ? ? PT Long Term Goals - 09/29/21 1633   ? ?  ? PT LONG TERM GOAL #1  ? Title PT to be I in an advanced HEP to allow pt to come sit to stand from low lying couches without the use of his UE>   ? Baseline 09/29/21:  Depends upon hands when knees above hips, able to stand without HHA from neutral or higher   ? Status Partially Met   ?  ? PT LONG TERM GOAL #2  ? Title Pt to have 115 degrees hip flexion to allow him to easily tie his shoes.   ? Baseline 09/29/21:  Reports ability to tie shoes without difficulty   ?  ? PT LONG TERM GOAL #3  ? Title PT to be able to go up and down 16 steps in a reciprocal manner using one handrail.   ? Baseline 09/30/20: Able to demonstrate reciprocal manner with no HR ascending, 1 HR descending no difficulty   ? Status Achieved   ?  ? PT LONG TERM GOAL #4  ? Title Pt to be able to single leg stance for 30 seconds B to reduce risk of falling and allow pt to feel comfortable walking on uneven surfaces.   ?  ? PT LONG TERM GOAL #5  ? Title PT to no longer be experiencing any pain in his left hip   ? Baseline 09/29/21: Continues to surgical pain where incision is, no joint pain.   ? Status On-going   ? ?  ?  ? ?  ? ? ? ? ? ? ? ? Plan - 10/11/21 1525   ? ? Clinical Impression Statement Patient is progressing well.  He reports increased activity at home and has returned to work.  he want to be able to return to golf. Today's session focused on lower extremity strengthening and mobility and we added sidestepping with Theraband and single leg stance with ball toss to work on patient's balance.  Patient will continue to benefit from skilled therapy  services to reduce deficits and improve functional level.   ? Examination-Activity Limitations Bed Mobility;Dressing;Lift;Stairs;Squat   ? Examination-Participation Restrictions Cleaning;Community Activity   ? Stability/Clinical Decision Making Stable/Uncomplicated   ? Rehab Potential Good   ? PT  Frequency 2x / week   ? PT Duration 6 weeks   ? PT Treatment/Interventions Balance training;Stair training;Gait training;Therapeutic exercise;Manual techniques;Patient/family education   ? PT Next Visit Plan rocker board, progressing functional strengthening. Continue with STS transfers focusing on no UE use. core strengthening in diagonal motion to prepare for return to golfing.   ? PT Home Exercise Plan sit to stand using UE, heel raises, mini squats, single leg stance.; 2/27:  lunge, march, vector stance   ? Consulted and Agree with Plan of Care Patient   ? ?  ?  ? ?  ? ? ?Patient will benefit from skilled therapeutic intervention in order to improve the following deficits and impairments:  Pain, Decreased strength, Decreased activity tolerance, Decreased range of motion, Difficulty walking ? ?Visit Diagnosis: ?Muscle weakness (generalized) ? ?Pain in left hip ? ?Difficulty in walking, not elsewhere classified ? ? ? ? ?Problem List ?Patient Active Problem List  ? Diagnosis Date Noted  ? S/P hip replacement, left 08/24/21 09/02/2021  ? Osteoarthritis of left hip 08/24/2021  ? Unilateral primary osteoarthritis, left hip   ? Chronic constipation 07/27/2021  ? Elevated BP without diagnosis of hypertension 07/27/2021  ? OA (osteoarthritis) of ankle 09/26/2012  ? Pain in joint, ankle and foot 09/26/2012  ? ? ?3:34 PM, 10/11/21 ?[facility] Fay Small Frankie Scipio ?[facility] ?[place] [phone] ?[phone] ? ? ?[facility] ?[place] ?[address] ?[place], [place], [address] ?Phone: [phone]   Fax:  [phone] ? ?Name: Dillon [name] ?MRN: [id number] ?Date of Birth: 12-06-1955 ? ? ? ?

## 2021-10-15 ENCOUNTER — Ambulatory Visit (HOSPITAL_COMMUNITY): Payer: BC Managed Care – PPO

## 2021-10-15 ENCOUNTER — Other Ambulatory Visit: Payer: Self-pay

## 2021-10-15 ENCOUNTER — Encounter (HOSPITAL_COMMUNITY): Payer: Self-pay

## 2021-10-15 DIAGNOSIS — M6281 Muscle weakness (generalized): Secondary | ICD-10-CM | POA: Diagnosis not present

## 2021-10-15 DIAGNOSIS — M25552 Pain in left hip: Secondary | ICD-10-CM

## 2021-10-15 DIAGNOSIS — R262 Difficulty in walking, not elsewhere classified: Secondary | ICD-10-CM

## 2021-10-15 NOTE — Therapy (Addendum)
?OUTPATIENT PHYSICAL THERAPY TREATMENT NOTE ? ? ?Patient Name: Dillon [name] ?MRN: [id number] ?DOB:05-14-1956, 66 y.o., male ?Today's Date: 10/15/2021 ? ?PCP: Coral Spikes, DO ?REFERRING PROVIDER: Coral Spikes, DO ? ?PHYSICAL THERAPY DISCHARGE SUMMARY ? ?Visits from Start of Care: 11 ? ?Current functional level related to goals / functional outcomes: ?Goals met ?  ?Remaining deficits: ?none ?  ?Education / Equipment: ?HEP  ? ?Patient agrees to discharge. Patient goals were met. Patient is being discharged due to meeting the stated rehab goals.  ? ? PT End of Session - 10/15/21 1450   ? ? Visit Number 11   ? Number of Visits 12   ? Date for PT Re-Evaluation 10/20/21   ? Authorization Type bcbs   ? PT Start Time 2831   ? PT Stop Time 1522   ? PT Time Calculation (min) 33 min   ? Activity Tolerance Patient tolerated treatment well   ? Behavior During Therapy [facility] for tasks assessed/performed   ? ?  ?  ? ?  ? ? ?Past Medical History:  ?Diagnosis Date  ? Hypertension   ? Lumbar spondylosis   ? ?Past Surgical History:  ?Procedure Laterality Date  ? COLONOSCOPY N/A 01/24/2019  ? Procedure: COLONOSCOPY;  Surgeon: [name], MD;  [facility];  Service: Endoscopy;  Laterality: N/A;  1030  ? HAND SURGERY Left   ? closed reduction left hand  ? JOINT REPLACEMENT Right   ? Ankle replacement  ? KNEE SURGERY    ? POLYPECTOMY  01/24/2019  ? Procedure: POLYPECTOMY;  Surgeon: [name], MD;  [facility];  Service: Endoscopy;;  colon  ? TOTAL HIP ARTHROPLASTY Left 08/24/2021  ? Procedure: TOTAL HIP ARTHROPLASTY;  Surgeon: [name], MD;  Location: [facility];  Service: Orthopedics;  Laterality: Left;  ? WRIST FRACTURE SURGERY    ? ?Patient Active Problem List  ? Diagnosis Date Noted  ? S/P hip replacement, left 08/24/21 09/02/2021  ? Osteoarthritis of left hip 08/24/2021  ? Unilateral primary osteoarthritis, left hip   ? Chronic constipation 07/27/2021  ? Elevated BP without diagnosis of  hypertension 07/27/2021  ? OA (osteoarthritis) of ankle 09/26/2012  ? Pain in joint, ankle and foot 09/26/2012  ? ? ?REFERRING DIAG: Lt anterior hip replacement ? ?THERAPY DIAG:  ?Muscle weakness (generalized) ? ?Pain in left hip ? ?Difficulty in walking, not elsewhere classified ? ?PERTINENT HISTORY:  ?Rt ankle replacements ? ?PRECAUTIONS: Anterior Hip   surgeon detaches hip abductors request no strengthening for 6 weeks post. ? ?SUBJECTIVE: Pt stated he walked 11,000 steps, moved your yard, feels he has met his goals. ? ?PAIN:  ?Are you having pain? No ? ? ? ? ?TODAY'S TREATMENT:  ? ? 10/15/21 0001  ?Ambulation/Gait  ?Ambulation Distance (Feet) 604 Feet ?(was 327 with SPC)  ?Assistive device None  ?Gait Pattern WFL  ?Stairs Yes  ?Stairs Assistance 7: Independent  ?Stair Management Technique Alternating pattern;One rail Right  ?Number of Stairs 16  ?Height of Stairs 7  ?Gait Comments 2MWT  ?Knee/Hip Exercises: Standing  ?Functional Squat 15 reps  ?Stairs 4RT no HR reciprocal pattern  ?SLS Rt 8", Lt 10" max of 10  ?SLS with Vectors 5 x 5 " B  ?Other Standing Knee Exercises tandem stance with head turns  ?Other Standing Knee Exercises golf swing 20x wiht GTB  ?Knee/Hip Exercises: Seated  ?Sit to Sand 5 reps;without UE support ?(7.94")  ? ? ? ?PATIENT EDUCATION: ?Education details:  Reviewed exercises to continue following DC, encouraged to complete balance activities daily  ?Person educated: Patient ?Education method: Explanation and Handouts ?Education comprehension: verbalized understanding ? ? ?HOME EXERCISE PROGRAM: ?sit to stand using UE, heel raises, mini squats, single leg stance.; 2/27:  lunge, march, vector stance; 3/10: tandem with head turns, SLS, vector stance, side step with GTB ? ? PT Short Term Goals - 10/15/21 1453   ? ?  ? PT SHORT TERM GOAL #1  ? Title PT to be I in his HEP to improve his LT LE strength to feel confident ambulation without an assistive device.   ? Baseline 3/10:  Reports compliance  wiht HEP daily.  09/29/21:  Reports complaince wiht HEP multiple time a week.  Reports ability to go for walk wihtout AD   ? Status Achieved   ?  ? PT SHORT TERM GOAL #2  ? Title Pt to be able to single leg stance for 20 seconds B to reduce risk of falling.   ? Baseline 3/10: Rt 8', Lt 10" max 09/29/21: 3-5" max   ? Status On-going   ? ?  ?  ? ?  ? ? ? PT Long Term Goals - 10/15/21 1508   ? ?  ? PT LONG TERM GOAL #1  ? Title PT to be I in an advanced HEP to allow pt to come sit to stand from low lying couches without the use of his UE>   ? Baseline 3/10:  Reports complinace with HEP daily, able to stand from low surface with no UE Support of difficult6y.  09/29/21:  Depends upon hands when knees above hips, able to stand without HHA from neutral or higher   ? Status Achieved   ?  ? PT LONG TERM GOAL #2  ? Title Pt to have 115 degrees hip flexion to allow him to easily tie his shoes.   ? Baseline 09/29/21:  Reports ability to tie shoes without difficulty   ? Status Achieved   ?  ? PT LONG TERM GOAL #3  ? Title PT to be able to go up and down 16 steps in a reciprocal manner using one handrail.   ? Baseline 3/10:  Able to demonstrated reciprocal manner with no HR both ascend and descending; 09/30/20: Able to demonstrate reciprocal manner with no HR ascending, 1 HR descending no difficulty   ? Status Achieved   ?  ? PT LONG TERM GOAL #4  ? Title Pt to be able to single leg stance for 30 seconds B to reduce risk of falling and allow pt to feel comfortable walking on uneven surfaces.   ? Baseline 3/10:  Lt 10", Rt 8" max of 5   ? Status On-going   ?  ? PT LONG TERM GOAL #5  ? Title PT to no longer be experiencing any pain in his left hip   ? Baseline 3/10:  Reports only surgical pain, no hip pain.  09/29/21: Continues to surgical pain where incision is, no joint pain.   ? Status Achieved   ? ?  ?  ? ?  ? ? ? Plan - 10/15/21 1522   ? ? Clinical Impression Statement Pt arrived stating he has been walking average 11,000 steps per  day, ability to mow yard and feels ready for DC.  Reviewed goals with the following findings:  Pt has met 1/2 STG and 4/5 LTGs.  Pt presents iwth increased cadence during 2MWT, able to complete reciprocal pattern with  stairs with no HR assistance required, strength and mobility.  Pt limited with balalnce, improved ability with SLS though continues.  Pt given advanced HEP with focus on balalnce to continue at home.   ? Examination-Activity Limitations Bed Mobility;Dressing;Lift;Stairs;Squat   ? Examination-Participation Restrictions Cleaning;Community Activity   ? Stability/Clinical Decision Making Stable/Uncomplicated   ? Clinical Decision Making Moderate   ? Rehab Potential Good   ? PT Frequency 2x / week   ? PT Duration 6 weeks   ? PT Treatment/Interventions Balance training;Stair training;Gait training;Therapeutic exercise;Manual techniques;Patient/family education   ? PT Next Visit Plan DC to HEP.   ? PT Home Exercise Plan sit to stand using UE, heel raises, mini squats, single leg stance.; 2/27:  lunge, march, vector stance; 3/10: SLS, tandem with head turns, sidestep with theraband, vector stance   ? Consulted and Agree with Plan of Care Patient   ? ?  ?  ? ?  ? ? ? ?Ihor Austin, LPTA/CLT; CBIS ?[phone] ?Rayetta Humphrey, PT CLT ?[phone]  ?10/15/2021, 3:40 PM ? ?  ? ?

## 2021-10-15 NOTE — Patient Instructions (Signed)
Tandem Stance ? ? ? ?Right foot in front of left, heel touching toe both feet "straight ahead". Stand on Foot Triangle of Support with both feet.  ?Add head turns. ?Balance in this position 30 seconds. ?Do with left foot in front of right. ? ?Copyright ? V[employer]. All rights reserved.  ? ?Band Walk: Side Stepping ? ? ? ?Tie band around legs, just above knees. Step 20 feet to one side, then step back to start. ?Repeat ___ feet per session. ?Note: Small towel between band and skin eases rubbing. ? ?http://plyo.exer.us/76  ? ?Copyright ? VHI. All rights reserved.  ? ?HIP: Flexion / KNEE: Extension, Heel Strike - Standing ? ?  ? ? ?Standing tall bring leg forward, out to side, and then behind hold each position for 5" each. ?Stand near counter for safety. ? ?

## 2021-10-19 ENCOUNTER — Encounter (HOSPITAL_COMMUNITY): Payer: BC Managed Care – PPO | Admitting: Physical Therapy

## 2021-10-21 ENCOUNTER — Ambulatory Visit (HOSPITAL_COMMUNITY): Payer: BC Managed Care – PPO

## 2021-10-26 ENCOUNTER — Encounter (HOSPITAL_COMMUNITY): Payer: BC Managed Care – PPO

## 2021-10-28 ENCOUNTER — Encounter (HOSPITAL_COMMUNITY): Payer: BC Managed Care – PPO | Admitting: Physical Therapy

## 2021-11-02 ENCOUNTER — Encounter (HOSPITAL_COMMUNITY): Payer: BC Managed Care – PPO | Admitting: Physical Therapy

## 2021-11-04 ENCOUNTER — Encounter (HOSPITAL_COMMUNITY): Payer: BC Managed Care – PPO

## 2021-11-19 ENCOUNTER — Other Ambulatory Visit: Payer: Self-pay | Admitting: Family Medicine

## 2021-11-19 ENCOUNTER — Telehealth: Payer: Self-pay | Admitting: Family Medicine

## 2021-11-19 DIAGNOSIS — Z79899 Other long term (current) drug therapy: Secondary | ICD-10-CM

## 2021-11-19 DIAGNOSIS — R748 Abnormal levels of other serum enzymes: Secondary | ICD-10-CM

## 2021-11-19 DIAGNOSIS — Z13220 Encounter for screening for lipoid disorders: Secondary | ICD-10-CM

## 2021-11-19 DIAGNOSIS — Z0000 Encounter for general adult medical examination without abnormal findings: Secondary | ICD-10-CM

## 2021-11-19 DIAGNOSIS — Z125 Encounter for screening for malignant neoplasm of prostate: Secondary | ICD-10-CM

## 2021-11-19 NOTE — Telephone Encounter (Signed)
Lab orders placed and pt is aware 

## 2021-11-19 NOTE — Telephone Encounter (Signed)
Last labs completed 12/08/20 CBC,PSA, Hepatic, BMET, Lipid. Please advise. Thank you. ?

## 2021-11-19 NOTE — Telephone Encounter (Signed)
Patient has physical 12/16/21 and needing labs done ?

## 2021-12-02 LAB — CMP14+EGFR
ALT: 16 IU/L (ref 0–44)
AST: 20 IU/L (ref 0–40)
Albumin/Globulin Ratio: 2 (ref 1.2–2.2)
Albumin: 4.3 g/dL (ref 3.8–4.8)
Alkaline Phosphatase: 148 IU/L — ABNORMAL HIGH (ref 44–121)
BUN/Creatinine Ratio: 18 (ref 10–24)
BUN: 16 mg/dL (ref 8–27)
Bilirubin Total: 0.4 mg/dL (ref 0.0–1.2)
CO2: 25 mmol/L (ref 20–29)
Calcium: 9.2 mg/dL (ref 8.6–10.2)
Chloride: 104 mmol/L (ref 96–106)
Creatinine, Ser: 0.87 mg/dL (ref 0.76–1.27)
Globulin, Total: 2.1 g/dL (ref 1.5–4.5)
Glucose: 96 mg/dL (ref 70–99)
Potassium: 4.2 mmol/L (ref 3.5–5.2)
Sodium: 142 mmol/L (ref 134–144)
Total Protein: 6.4 g/dL (ref 6.0–8.5)
eGFR: 96 mL/min/{1.73_m2} (ref >59)

## 2021-12-02 LAB — CBC WITH DIFFERENTIAL/PLATELET
Basophils Absolute: 0 10*3/uL (ref 0.0–0.2)
Basos: 1 %
EOS (ABSOLUTE): 0.3 10*3/uL (ref 0.0–0.4)
Eos: 4 %
Hematocrit: 40.5 % (ref 37.5–51.0)
Hemoglobin: 13.4 g/dL (ref 13.0–17.7)
Immature Grans (Abs): 0 10*3/uL (ref 0.0–0.1)
Immature Granulocytes: 0 %
Lymphocytes Absolute: 1.6 10*3/uL (ref 0.7–3.1)
Lymphs: 25 %
MCH: 28.5 pg (ref 26.6–33.0)
MCHC: 33.1 g/dL (ref 31.5–35.7)
MCV: 86 fL (ref 79–97)
Monocytes Absolute: 0.6 10*3/uL (ref 0.1–0.9)
Monocytes: 9 %
Neutrophils Absolute: 3.9 10*3/uL (ref 1.4–7.0)
Neutrophils: 61 %
Platelets: 219 10*3/uL (ref 150–450)
RBC: 4.71 x10E6/uL (ref 4.14–5.80)
RDW: 13.8 % (ref 11.6–15.4)
WBC: 6.5 10*3/uL (ref 3.4–10.8)

## 2021-12-02 LAB — LIPID PANEL
Chol/HDL Ratio: 2.9 ratio (ref 0.0–5.0)
Cholesterol, Total: 166 mg/dL (ref 100–199)
HDL: 58 mg/dL (ref >39)
LDL Chol Calc (NIH): 94 mg/dL (ref 0–99)
Triglycerides: 72 mg/dL (ref 0–149)
VLDL Cholesterol Cal: 14 mg/dL (ref 5–40)

## 2021-12-02 LAB — PSA: Prostate Specific Ag, Serum: 2.2 ng/mL (ref 0.0–4.0)

## 2021-12-03 NOTE — Addendum Note (Signed)
Addended by: Dairl Ponder on: 12/03/2021 02:01 PM ? ? Modules accepted: Orders ? ?

## 2021-12-04 LAB — GAMMA GT: GGT: 13 [IU]/L (ref 0–65)

## 2021-12-07 ENCOUNTER — Other Ambulatory Visit: Payer: Self-pay | Admitting: *Deleted

## 2021-12-07 ENCOUNTER — Ambulatory Visit
Admission: EM | Admit: 2021-12-07 | Discharge: 2021-12-07 | Disposition: A | Discharge status: Home / Self Care | Payer: BC Managed Care – PPO | Attending: Family Medicine | Admitting: Family Medicine

## 2021-12-07 ENCOUNTER — Encounter: Payer: Self-pay | Admitting: Emergency Medicine

## 2021-12-07 DIAGNOSIS — S61012A Laceration without foreign body of left thumb without damage to nail, initial encounter: Secondary | ICD-10-CM

## 2021-12-07 DIAGNOSIS — R748 Abnormal levels of other serum enzymes: Secondary | ICD-10-CM

## 2021-12-07 MED ORDER — CHLORHEXIDINE GLUCONATE 4 % EX LIQD: Freq: Every day | CUTANEOUS | 0 refills | Status: DC | PRN

## 2021-12-07 MED ORDER — MUPIROCIN 2 % EX OINT
1.0000 | TOPICAL_OINTMENT | Freq: Two times a day (BID) | CUTANEOUS | 0 refills | Status: DC
Start: 2021-12-07 — End: 2022-01-13

## 2021-12-07 MED ORDER — CEPHALEXIN 500 MG PO CAPS
500.0000 mg | ORAL_CAPSULE | Freq: Two times a day (BID) | ORAL | 0 refills | Status: DC
Start: 2021-12-07 — End: 2021-12-16

## 2021-12-07 NOTE — ED Provider Notes (Addendum)
?[place] ? ? ? ?CSN: [id number] ?Arrival date & time: 12/07/21  1853 ? ? ?  ? ?History   ?Chief Complaint ?No chief complaint on file. ? ? ?HPI ?Dillon [name] is a 66 y.o. male.  ? ?Presenting today with a laceration to the tip of his left thumb that occurred today when he was using a tool.  He states has been holding pressure and rinsing it with water but the bleeding has continued.  He denies numbness, tingling, decreased range of motion, injury to the nail.  Last tetanus shot was in 2020. ? ? ?Past Medical History:  ?Diagnosis Date  ? Hypertension   ? Lumbar spondylosis   ? ? ?Patient Active Problem List  ? Diagnosis Date Noted  ? S/P hip replacement, left 08/24/21 09/02/2021  ? Osteoarthritis of left hip 08/24/2021  ? Unilateral primary osteoarthritis, left hip   ? Chronic constipation 07/27/2021  ? Elevated BP without diagnosis of hypertension 07/27/2021  ? OA (osteoarthritis) of ankle 09/26/2012  ? Pain in joint, ankle and foot 09/26/2012  ? ? ?Past Surgical History:  ?Procedure Laterality Date  ? COLONOSCOPY N/A 01/24/2019  ? Procedure: COLONOSCOPY;  Surgeon: [name], MD;  [facility];  Service: Endoscopy;  Laterality: N/A;  1030  ? HAND SURGERY Left   ? closed reduction left hand  ? JOINT REPLACEMENT Right   ? Ankle replacement  ? KNEE SURGERY    ? POLYPECTOMY  01/24/2019  ? Procedure: POLYPECTOMY;  Surgeon: [name], MD;  Location: [facility];  Service: Endoscopy;;  colon  ? TOTAL HIP ARTHROPLASTY Left 08/24/2021  ? Procedure: TOTAL HIP ARTHROPLASTY;  Surgeon: [name], MD;  Location: [facility];  Service: Orthopedics;  Laterality: Left;  ? WRIST FRACTURE SURGERY    ? ? ? ? ? ?Home Medications   ? ?Prior to Admission medications   ?Medication Sig Start Date End Date Taking? Authorizing Provider  ?cephALEXin (KEFLEX) 500 MG capsule Take 1 capsule (500 mg total) by mouth 2 (two) times daily. 12/07/21  Yes Volney American, PA-C  ?chlorhexidine  (HIBICLENS) 4 % external liquid Apply topically daily as needed. 12/07/21  Yes Volney American, PA-C  ?mupirocin ointment (BACTROBAN) 2 % Apply 1 application. topically 2 (two) times daily. 12/07/21  Yes Volney American, PA-C  ?celecoxib (CELEBREX) 200 MG capsule Take 1 capsule (200 mg total) by mouth daily. 08/26/21   [name], MD  ?cetirizine (ZYRTEC) 10 MG tablet Take 10 mg by mouth daily as needed for allergies.    [provider]  ?Cholecalciferol (VITAMIN D3) 50 MCG (2000 UT) TABS Take 2,000 Units by mouth in the morning.    [provider]  ?COD LIVER OIL PO Take 415 mg by mouth in the morning.    [provider]  ?docusate sodium (COLACE) 100 MG capsule Take 1 capsule (100 mg total) by mouth 2 (two) times daily. 08/25/21   [name], MD  ?ELDERBERRY PO Take 225 mg by mouth in the morning.    [provider]  ?Magnesium 250 MG TABS Take 250 mg by mouth in the morning.    [provider]  ?polyethylene glycol (MIRALAX / GLYCOLAX) 17 g packet Take 17 g by mouth daily. 08/26/21   [name], MD  ?Probiotic Product (PROBIOTIC PO) Take 1 capsule by mouth in the morning.    [provider]  ?traMADol (ULTRAM) 50 MG tablet Take 50 mg  by mouth every 6 (six) hours as needed. 09/11/21   [provider]  ?TURMERIC PO Take 500 mg by mouth in the morning.    [provider]  ?zinc sulfate 220 (50 Zn) MG capsule Take 220 mg by mouth in the morning.    [provider]  ? ? ?Family History ?Family History  ?Problem Relation Age of Onset  ? Cancer Father   ? ? ?Social History ?Social History  ? ?Tobacco Use  ? Smoking status: Never  ? Smokeless tobacco: Never  ?Substance Use Topics  ? Alcohol use: Yes  ? Drug use: No  ? ? ? ?Allergies   ?Patient has no known allergies. ? ? ?Review of Systems ?Review of Systems ?Per HPI ? ?Physical Exam ?Triage Vital Signs ?ED Triage Vitals [12/07/21 1929]  ?Enc Vitals Group  ?    BP 135/88  ?   Pulse Rate 80  ?   Resp 18  ?   Temp 97.8 ?F (36.6 ?C)  ?   Temp Source Oral  ?   SpO2 95 %  ?   Weight   ?   Height   ?   Head Circumference   ?   Peak Flow   ?   Pain Score 0  ?   Pain Loc   ?   Pain Edu?   ?   Excl. in [place]?   ? ?No data found. ? ?Updated Vital Signs ?BP 135/88 (BP Location: Right Arm)   Pulse 80   Temp 97.8 ?F (36.6 ?C) (Oral)   Resp 18   SpO2 95%  ? ?Visual Acuity ?Right Eye Distance:   ?Left Eye Distance:   ?Bilateral Distance:   ? ?Right Eye Near:   ?Left Eye Near:    ?Bilateral Near:    ? ?Physical Exam ?Vitals and nursing note reviewed.  ?Constitutional:   ?   Appearance: Normal appearance.  ?HENT:  ?   Head: Atraumatic.  ?   Mouth/Throat:  ?   Mouth: Mucous membranes are moist.  ?Eyes:  ?   Extraocular Movements: Extraocular movements intact.  ?   Conjunctiva/sclera: Conjunctivae normal.  ?Cardiovascular:  ?   Rate and Rhythm: Normal rate and regular rhythm.  ?Pulmonary:  ?   Effort: Pulmonary effort is normal.  ?   Breath sounds: Normal breath sounds.  ?Musculoskeletal:     ?   General: Normal range of motion.  ?   Cervical back: Normal range of motion and neck supple.  ?   Comments: Range of motion of the left thumb fully intact  ?Skin: ?   General: Skin is warm.  ?   Comments: Well approximated 1.5 cm laceration to the distal left thumb, trace bleeding on exam.  No foreign body noted on exam, no damage to the nail  ?Neurological:  ?   General: No focal deficit present.  ?   Mental Status: He is oriented to person, place, and time.  ?   Motor: No weakness.  ?   Gait: Gait normal.  ?   Comments: Left hand neurovascularly intact  ?Psychiatric:     ?   Mood and Affect: Mood normal.     ?   Thought Content: Thought content normal.     ?   Judgment: Judgment normal.  ? ? ? ?UC Treatments / Results  ?Labs ?(all labs ordered are listed, but only abnormal results are displayed) ?Labs Reviewed - No data to display ? ?EKG ? ? ?  Radiology ?No results found. ? ?Procedures ?Laceration  Repair ? ?Date/Time: 12/07/2021 7:51 PM ?Performed by: Volney American, PA-C ?Authorized by: Volney American, PA-C  ? ?Consent:  ?  Consent obtained:  Verbal ?  Consent given by:  Patient ?  Risks, benefits, and alternatives were discussed: yes   ?  Risks discussed:  Poor cosmetic result and poor wound healing ?  Alternatives discussed: Suture. ?Universal protocol:  ?  Procedure explained and questions answered to patient or proxy's satisfaction: yes   ?  Relevant documents present and verified: yes   ?  Patient identity confirmed:  Verbally with patient ?Anesthesia:  ?  Anesthesia method:  Topical application ?  Topical anesthetic:  LET ?Laceration details:  ?  Location:  Finger ?  Finger location:  L thumb ?  Length (cm):  1.5 ?  Depth (mm):  3 ?Pre-procedure details:  ?  Preparation:  Patient was prepped and draped in usual sterile fashion ?Exploration:  ?  Hemostasis achieved with:  LET ?  Contaminated: no   ?Treatment:  ?  Area cleansed with:  Chlorhexidine ?  Amount of cleaning:  Standard ?Skin repair:  ?  Repair method:  Tissue adhesive ?Approximation:  ?  Approximation:  Close ?Repair type:  ?  Repair type:  Simple ?Post-procedure details:  ?  Dressing:  Non-adherent dressing ?  Procedure completion:  Tolerated well, no immediate complications (including critical care time) ? ?Medications Ordered in UC ?Medications - No data to display ? ?Initial Impression / Assessment and Plan / UC Course  ?I have reviewed the triage vital signs and the nursing notes. ? ?Pertinent labs & imaging results that were available during my care of the patient were reviewed by me and considered in my medical decision making (see chart for details). ? ?  ? ?Let gel applied to help control bleeding, Dermabond then applied with a pressure dressing on top.  We will treat with Keflex, Hibiclens, mupirocin and discussed home wound care, return precautions.  Tetanus up-to-date. ? ?Final Clinical Impressions(s) / UC Diagnoses   ? ?Final diagnoses:  ?Laceration of left thumb without foreign body without damage to nail, initial encounter  ? ?Discharge Instructions   ?None ?  ? ?ED Prescriptions   ? ? Medication Sig Dispense Auth. Provide

## 2021-12-07 NOTE — ED Triage Notes (Signed)
Cut left thumb today with a sickle.  Laceration at top of thumb.   Last tetanus was 2020. ?

## 2021-12-13 ENCOUNTER — Telehealth: Payer: Self-pay | Admitting: "Endocrinology

## 2021-12-13 NOTE — Telephone Encounter (Signed)
Dr Dorris Fetch ?I received a referral on this patient for Elevated alkaline phosphatase level. Would you please review the chart and let me know if I need to accept this referral? ? ?Thank you ?

## 2021-12-16 ENCOUNTER — Encounter: Payer: Self-pay | Admitting: Family Medicine

## 2021-12-16 ENCOUNTER — Ambulatory Visit (INDEPENDENT_AMBULATORY_CARE_PROVIDER_SITE_OTHER): Payer: BC Managed Care – PPO | Admitting: Family Medicine

## 2021-12-16 VITALS — BP 136/88 | HR 75 | Temp 98.2°F | Ht 72.0 in | Wt 229.0 lb

## 2021-12-16 DIAGNOSIS — Z23 Encounter for immunization: Secondary | ICD-10-CM

## 2021-12-16 DIAGNOSIS — Z0000 Encounter for general adult medical examination without abnormal findings: Secondary | ICD-10-CM

## 2021-12-16 DIAGNOSIS — R748 Abnormal levels of other serum enzymes: Secondary | ICD-10-CM | POA: Insufficient documentation

## 2021-12-16 NOTE — Progress Notes (Signed)
? ?Subjective:  ?Patient ID: Dillon [name], Dillon [name]    DOB: 1956-04-25  Age: 66 y.o. MRN: [id number] ? ?CC: ?Chief Complaint  ?Patient presents with  ? Annual Exam  ? ? ?HPI: ? ?66 year old Dillon [name] presents for an annual physical exam. ? ?Patient is doing well.  He has upcoming evaluation with endocrinology for elevated alkaline phosphatase in the setting of a normal GGT. ? ?He is feeling well.  Blood pressure initially elevated but was improved on repeat.  He is monitoring his blood pressures closely at home. ? ?Patient is in need of pneumococcal vaccine today.  He is a candidate as he is 66 years of age.  He is okay with proceeding with the vaccine.  Colonoscopy up-to-date.  Due in 2025.  Tetanus up-to-date.  Has been vaccinated against COVID-19.  Has had an annual eye exam and dental exam. ? ?Patient Active Problem List  ? Diagnosis Date Noted  ? Annual physical exam 12/16/2021  ? Elevated alkaline phosphatase level 12/16/2021  ? S/P hip replacement, left 08/24/21 09/02/2021  ? Chronic constipation 07/27/2021  ? Elevated BP without diagnosis of hypertension 07/27/2021  ? ? ?Social Hx   ?Social History  ? ?Socioeconomic History  ? Marital status: Married  ?  Spouse name: Not on file  ? Number of children: Not on file  ? Years of education: Not on file  ? Highest education level: Not on file  ?Occupational History  ? Not on file  ?Tobacco Use  ? Smoking status: Never  ? Smokeless tobacco: Never  ?Substance and Sexual Activity  ? Alcohol use: Yes  ? Drug use: No  ? Sexual activity: Not on file  ?Other Topics Concern  ? Not on file  ?Social History Narrative  ? Not on file  ? ?Social Determinants of Health  ? ?Financial Resource Strain: Not on file  ?Food Insecurity: Not on file  ?Transportation Needs: Not on file  ?Physical Activity: Not on file  ?Stress: Not on file  ?Social Connections: Not on file  ? ? ?Review of Systems  ?Constitutional: Negative.   ?Respiratory: Negative.    ?Cardiovascular: Negative.    ?Musculoskeletal:   ?     Right knee pain.  ? ?Objective:  ?BP 136/88   Pulse 75   Temp 98.2 ?F (36.8 ?C)   Ht 6' (1.829 m)   Wt 229 lb (103.9 kg)   SpO2 96%   BMI 31.06 kg/m?  ? ? ?  12/16/2021  ?  9:04 AM 12/07/2021  ?  7:29 PM 08/25/2021  ?  3:35 AM  ?BP/Weight  ?Systolic BP 967 893 810  ?Diastolic BP 88 88 72  ?Wt. (Lbs) 229    ?BMI 31.06 kg/m2    ? ? ?Physical Exam ?Vitals and nursing note reviewed.  ?Constitutional:   ?   General: He is not in acute distress. ?   Appearance: Normal appearance. He is not ill-appearing.  ?HENT:  ?   Head: Normocephalic and atraumatic.  ?   Right Ear: Tympanic membrane normal.  ?   Left Ear: Tympanic membrane normal.  ?   Mouth/Throat:  ?   Pharynx: Oropharynx is clear.  ?Eyes:  ?   General:     ?   Right eye: No discharge.     ?   Left eye: No discharge.  ?   Conjunctiva/sclera: Conjunctivae normal.  ?Cardiovascular:  ?   Rate and Rhythm: Normal rate and regular rhythm.  ?Pulmonary:  ?   Effort:  Pulmonary effort is normal.  ?   Breath sounds: Normal breath sounds. No wheezing, rhonchi or rales.  ?Abdominal:  ?   General: There is no distension.  ?   Palpations: Abdomen is soft.  ?   Tenderness: There is no abdominal tenderness.  ?Neurological:  ?   Mental Status: He is alert.  ?Psychiatric:     ?   Mood and Affect: Mood normal.     ?   Behavior: Behavior normal.  ? ? ?Lab Results  ?Component Value Date  ? WBC 6.5 12/01/2021  ? HGB 13.4 12/01/2021  ? HCT 40.5 12/01/2021  ? PLT 219 12/01/2021  ? GLUCOSE 96 12/01/2021  ? CHOL 166 12/01/2021  ? TRIG 72 12/01/2021  ? HDL 58 12/01/2021  ? [place] 94 12/01/2021  ? ALT 16 12/01/2021  ? AST 20 12/01/2021  ? NA 142 12/01/2021  ? K 4.2 12/01/2021  ? CL 104 12/01/2021  ? CREATININE 0.87 12/01/2021  ? BUN 16 12/01/2021  ? CO2 25 12/01/2021  ? ? ? ?Assessment & Plan:  ? ?Problem List Items Addressed This Visit   ? ?  ? Other  ? Annual physical exam - Primary  ?  Doing well.  Pneumococcal 20 given today. ?Remainder of preventative health  care up-to-date. ?I have reviewed his recent labs. ?He is doing well at this time. ? ?  ?  ? ?Other Visit Diagnoses   ? ? Need for vaccination      ? Relevant Orders  ? Pneumococcal conjugate vaccine 20-valent (Prevnar 20) (Completed)  ? ?  ? ?Thersa Salt DO ?[place] ? ?

## 2021-12-16 NOTE — Patient Instructions (Signed)
Keep a close eye on your blood pressures. ? ?Follow up annually or sooner if needed. ? ?Take care ? ?Dr. Lacinda Axon  ?

## 2021-12-16 NOTE — Assessment & Plan Note (Signed)
Doing well.  Pneumococcal 20 given today. ?Remainder of preventative health care up-to-date. ?I have reviewed his recent labs. ?He is doing well at this time. ?

## 2022-01-13 ENCOUNTER — Ambulatory Visit (INDEPENDENT_AMBULATORY_CARE_PROVIDER_SITE_OTHER): Payer: BC Managed Care – PPO | Admitting: "Endocrinology

## 2022-01-13 ENCOUNTER — Encounter: Payer: Self-pay | Admitting: "Endocrinology

## 2022-01-13 VITALS — BP 156/102 | HR 80 | Ht 72.0 in | Wt 228.0 lb

## 2022-01-13 DIAGNOSIS — R748 Abnormal levels of other serum enzymes: Secondary | ICD-10-CM

## 2022-01-13 NOTE — Progress Notes (Signed)
Endocrinology Consult Note                                            01/13/2022, 6:53 PM   Subjective:    Patient ID: Dillon [name], male    DOB: 1956/05/11, PCP Coral Spikes, DO   Past Medical History:  Diagnosis Date   Hypertension    Lumbar spondylosis    Past Surgical History:  Procedure Laterality Date   COLONOSCOPY N/A 01/24/2019   Procedure: COLONOSCOPY;  Surgeon: [name], MD;  [facility];  Service: Endoscopy;  Laterality: N/A;  1030   HAND SURGERY Left    closed reduction left hand   JOINT REPLACEMENT Right    Ankle replacement   KNEE SURGERY     POLYPECTOMY  01/24/2019   Procedure: POLYPECTOMY;  Surgeon: [name], MD;  [facility];  Service: Endoscopy;;  colon   TOTAL HIP ARTHROPLASTY Left 08/24/2021   Procedure: TOTAL HIP ARTHROPLASTY;  Surgeon: [name], MD;  Location: [facility];  Service: Orthopedics;  Laterality: Left;   WRIST FRACTURE SURGERY     Social History   Socioeconomic History   Marital status: Married    Spouse name: Not on file   Number of children: Not on file   Years of education: Not on file   Highest education level: Not on file  Occupational History   Not on file  Tobacco Use   Smoking status: Never   Smokeless tobacco: Never  Vaping Use   Vaping Use: Never used  Substance and Sexual Activity   Alcohol use: Yes   Drug use: No   Sexual activity: Not on file  Other Topics Concern   Not on file  Social History Narrative   Not on file   Social Determinants of Health   Financial Resource Strain: Not on file  Food Insecurity: Not on file  Transportation Needs: Not on file  Physical Activity: Not on file  Stress: Not on file  Social Connections: Not on file   Family History  Problem Relation Age of Onset   Hypertension Mother    Diabetes Mother    Hypertension Father    Cancer Father    Outpatient Encounter Medications as of 01/13/2022  Medication Sig    Glucosamine-Chondroitin (MOVE FREE PO) Take 1 tablet by mouth daily.   IBUPROFEN PO Take by mouth as needed.   [DISCONTINUED] Acetaminophen (TYLENOL PO) Take by mouth as needed.   cetirizine (ZYRTEC) 10 MG tablet Take 10 mg by mouth daily as needed for allergies.   Cholecalciferol (VITAMIN D3) 50 MCG (2000 UT) TABS Take 2,000 Units by mouth in the morning.   COD LIVER OIL PO Take 415 mg by mouth in the morning.   ELDERBERRY PO Take 225 mg by mouth in the morning.   Magnesium 250 MG TABS Take 250 mg by mouth in the morning.   polyethylene glycol (MIRALAX / GLYCOLAX) 17 g packet Take 17 g by mouth daily.   Probiotic Product (PROBIOTIC PO) Take 1 capsule by mouth in the morning.   TURMERIC PO Take 500 mg by mouth in the morning.   [DISCONTINUED] mupirocin ointment (BACTROBAN) 2 % Apply 1 application. topically 2 (two) times daily.   [DISCONTINUED] traMADol (ULTRAM) 50 MG tablet Take 50 mg by mouth every 6 (six)  hours as needed.   [DISCONTINUED] zinc sulfate 220 (50 Zn) MG capsule Take 220 mg by mouth in the morning.   Facility-Administered Encounter Medications as of 01/13/2022  Medication   bupivacaine-meloxicam ER (ZYNRELEF) injection 400 mg   ALLERGIES: No Known Allergies  VACCINATION STATUS: Immunization History  Administered Date(s) Administered   Influenza, High Dose Seasonal PF 05/11/2021   Influenza-Unspecified 05/22/2013, 04/10/2014, 04/08/2016, 06/08/2017, 05/04/2018, 05/07/2019, 05/25/2020   Moderna Sars-Covid-2 Vaccination 09/19/2019, 10/17/2019   PNEUMOCOCCAL CONJUGATE-20 12/16/2021   Tdap 09/12/2018   Zoster Recombinat (Shingrix) 09/27/2017, 01/08/2018    HPI JSON Dillon [name] is 66 y.o. male who presents today with a medical history as above. he is being seen in consultation for elevated alkaline phosphatase requested by Coral Spikes, DO.  History is obtained directly from the patient as well as chart review.  On routine blood work recently on December 01, 2021, he was found  to have elevated alk phos at 148. Patient does not have any prior history of liver problem, or bone problem.  He denies any history of Paget's disease, nor obstructive liver disease.   His status post hip replacement in January 2023.  Ankle surgery in 2018 as well as repair of radius/ulna in 2010. Due to some intermittent pain, he used to take tramadol, Tylenol, ibuprofen, however recently he stopped his Tylenol.  Patient denies any recent bone deformity.  He denies any hearing deficit, nor unexplained headaches. -He stays active with regular ambulation.  He is not a smoker.  He does not drink alcohol excessively.  He is a social alcohol user.  Was found to have elevated blood pressure, however he declared whitecoat hypertension and not on regular blood pressure medications. He is on vitamin D supplement, 2000 units daily, however does not have recent vitamin D measurement.  Review of Systems  Constitutional: no recent weight gain/loss, no fatigue, no subjective hyperthermia, no subjective hypothermia Eyes: no blurry vision, no xerophthalmia ENT: no sore throat, no nodules palpated in throat, no dysphagia/odynophagia, no hoarseness Cardiovascular: no Chest Pain, no Shortness of Breath, no palpitations, no leg swelling Respiratory: no cough, no shortness of breath Gastrointestinal: no Nausea/Vomiting/Diarhhea Musculoskeletal: no muscle/joint aches Skin: no rashes Neurological: no tremors, no numbness, no tingling, no dizziness Psychiatric: no depression, no anxiety  Objective:       01/13/2022    2:20 PM 12/16/2021    9:04 AM 12/07/2021    7:29 PM  Vitals with BMI  Height $Remov'6\' 0"'gDGyeY$  $Remove'6\' 0"'TaURnBr$    Weight 228 lbs 229 lbs   BMI 17.49 44.96   Systolic 759 163 846  Diastolic 659 88 88  Pulse 80 75 80    BP (!) 156/102   Pulse 80   Ht 6' (1.829 m)   Wt 228 lb (103.4 kg)   BMI 30.92 kg/m   Wt Readings from Last 3 Encounters:  01/13/22 228 lb (103.4 kg)  12/16/21 229 lb (103.9 kg)  08/25/21 219  lb (99.3 kg)    Physical Exam  Constitutional:  Body mass index is 30.92 kg/m.,  not in acute distress, normal state of mind Eyes: PERRLA, EOMI, no exophthalmos ENT: moist mucous membranes, no gross thyromegaly, no gross cervical lymphadenopathy Cardiovascular: normal precordial activity, Regular Rate and Rhythm, no Murmur/Rubs/Gallops Respiratory:  adequate breathing efforts, no gross chest deformity, Clear to auscultation bilaterally Gastrointestinal: abdomen soft, Non -tender, No distension, Bowel Sounds present, no gross organomegaly Musculoskeletal:  + Prominent area of bumpy bone left lower leg, patient explains remote football  injury, otherwise no other bony deformities, strength intact in all four extremities Skin: moist, warm, no rashes Neurological: no tremor with outstretched hands, Deep tendon reflexes normal in bilateral lower extremities.  CMP ( most recent) CMP     Component Value Date/Time   NA 142 12/01/2021 0814   K 4.2 12/01/2021 0814   CL 104 12/01/2021 0814   CO2 25 12/01/2021 0814   GLUCOSE 96 12/01/2021 0814   GLUCOSE 108 (H) 08/25/2021 0655   BUN 16 12/01/2021 0814   CREATININE 0.87 12/01/2021 0814   CALCIUM 9.2 12/01/2021 0814   PROT 6.4 12/01/2021 0814   ALBUMIN 4.3 12/01/2021 0814   AST 20 12/01/2021 0814   ALT 16 12/01/2021 0814   ALKPHOS 148 (H) 12/01/2021 0814   BILITOT 0.4 12/01/2021 0814   GFRNONAA >60 08/25/2021 0655   GFRAA 112 11/27/2019 1056      Lipid Panel ( most recent) Lipid Panel     Component Value Date/Time   CHOL 166 12/01/2021 0814   TRIG 72 12/01/2021 0814   HDL 58 12/01/2021 0814   CHOLHDL 2.9 12/01/2021 0814   LDLCALC 94 12/01/2021 0814   LABVLDL 14 12/01/2021 0814      Assessment & Plan:   1. Alkaline phosphatase elevation  - DAVEON ARPINO  is being seen at a kind request of Coral Spikes, DO. - I have reviewed his available  records and clinically evaluated the patient. - Based on these reviews, he has  elevated alk phos of  nonspecific origin,  however,  there is not sufficient information to proceed with definitive treatment plan. His GGT was determined to be normal on December 03, 2021 indicating unlikely source of bone for alk phos.  His blood type is O+.  Review of his records does not support obstructive liver disease either. Patient does not have clear etiology for elevation of alk phos, will not be committed for further work-up until repeat tests. He does not have evidence of Paget's disease.  No personal or recorded history of bone or hematologic malignancy.  He will have vitamin D measured. He will return with his labs in 5 weeks.  If alk phos continues to rise, he will be considered for bone scan.   - I did not initiate any new prescriptions today. - he is advised to maintain close follow up with Coral Spikes, DO for primary care needs.   - Time spent with the patient: 50 minutes, of which >50% was spent in  counseling him about his elevated alk phos and the rest in obtaining information about his symptoms, reviewing his previous labs/studies ( including abstractions from other facilities),  evaluations, and treatments,  and developing a plan to confirm diagnosis and long term treatment based on the latest standards of care/guidelines; and documenting his care.  Eulogio Bear participated in the discussions, expressed understanding, and voiced agreement with the above plans.  All questions were answered to his satisfaction. he is encouraged to contact clinic should he have any questions or concerns prior to his return visit.  Follow up plan: Return in about 5 weeks (around 02/17/2022) for F/U with Pre-visit Labs.   Glade Lloyd, MD [facility] [address] [place], [place] [address] Phone: [phone]  Fax: [phone]     01/13/2022, 6:53 PM  This note was partially dictated with voice recognition software. Similar sounding  words can be transcribed inadequately or may not  be corrected upon review.

## 2022-02-03 LAB — COMPREHENSIVE METABOLIC PANEL
ALT: 15 IU/L (ref 0–44)
AST: 22 IU/L (ref 0–40)
Albumin/Globulin Ratio: 1.7 (ref 1.2–2.2)
Albumin: 4.2 g/dL (ref 3.8–4.8)
Alkaline Phosphatase: 115 IU/L (ref 44–121)
BUN/Creatinine Ratio: 18 (ref 10–24)
BUN: 15 mg/dL (ref 8–27)
Bilirubin Total: 0.6 mg/dL (ref 0.0–1.2)
CO2: 23 mmol/L (ref 20–29)
Calcium: 9 mg/dL (ref 8.6–10.2)
Chloride: 105 mmol/L (ref 96–106)
Creatinine, Ser: 0.82 mg/dL (ref 0.76–1.27)
Globulin, Total: 2.5 g/dL (ref 1.5–4.5)
Glucose: 102 mg/dL — ABNORMAL HIGH (ref 70–99)
Potassium: 4.4 mmol/L (ref 3.5–5.2)
Sodium: 141 mmol/L (ref 134–144)
Total Protein: 6.7 g/dL (ref 6.0–8.5)
eGFR: 97 mL/min/{1.73_m2} (ref >59)

## 2022-02-03 LAB — VITAMIN D 25 HYDROXY (VIT D DEFICIENCY, FRACTURES): Vit D, 25-Hydroxy: 38.9 ng/mL (ref 30.0–100.0)

## 2022-02-09 ENCOUNTER — Ambulatory Visit (INDEPENDENT_AMBULATORY_CARE_PROVIDER_SITE_OTHER): Payer: BC Managed Care – PPO | Admitting: Orthopedic Surgery

## 2022-02-09 DIAGNOSIS — M1711 Unilateral primary osteoarthritis, right knee: Secondary | ICD-10-CM

## 2022-02-09 DIAGNOSIS — M1710 Unilateral primary osteoarthritis, unspecified knee: Secondary | ICD-10-CM

## 2022-02-09 NOTE — Progress Notes (Addendum)
Chief Complaint  Patient presents with   Knee Pain    Right, request injection   Encounter Diagnosis  Name Primary?   Primary localized osteoarthritis of knee Yes    Procedure note injection and aspiration right knee joint  Verbal consent was obtained to aspirate and inject the right knee joint   Timeout was completed to confirm the site of aspiration and injection  An 18-gauge needle was used to aspirate the knee joint from a suprapatellar lateral approach.  The medications used were 40 mg of Depo-Medrol and 1% lidocaine 3 cc  Anesthesia was provided by ethyl chloride and the skin was prepped with alcohol.  After cleaning the skin with alcohol an 18-gauge needle was used to aspirate the right knee joint.  We obtained 100  cc of fluid clear and yellow  We follow this by injection of 40 mg of Depo-Medrol and 3 cc 1% lidocaine.  There were no complications. A sterile bandage was applied.   Fu prn

## 2022-02-10 ENCOUNTER — Ambulatory Visit: Payer: BC Managed Care – PPO | Admitting: Orthopedic Surgery

## 2022-02-23 ENCOUNTER — Ambulatory Visit (INDEPENDENT_AMBULATORY_CARE_PROVIDER_SITE_OTHER): Payer: BC Managed Care – PPO | Admitting: "Endocrinology

## 2022-02-23 ENCOUNTER — Encounter: Payer: Self-pay | Admitting: "Endocrinology

## 2022-02-23 VITALS — BP 136/78 | HR 76 | Ht 72.0 in | Wt 226.8 lb

## 2022-02-23 DIAGNOSIS — R748 Abnormal levels of other serum enzymes: Secondary | ICD-10-CM | POA: Diagnosis not present

## 2022-02-23 NOTE — Progress Notes (Signed)
02/23/2022, 6:12 PM  Endocrinology follow-up note   Subjective:    Patient ID: Dillon [name], male    DOB: 24-Jan-1956, PCP Coral Spikes, DO   Past Medical History:  Diagnosis Date   Hypertension    Lumbar spondylosis    Past Surgical History:  Procedure Laterality Date   COLONOSCOPY N/A 01/24/2019   Procedure: COLONOSCOPY;  Surgeon: [name], MD;  Location: [facility];  Service: Endoscopy;  Laterality: N/A;  1030   HAND SURGERY Left    closed reduction left hand   JOINT REPLACEMENT Right    Ankle replacement   KNEE SURGERY     POLYPECTOMY  01/24/2019   Procedure: POLYPECTOMY;  Surgeon: [name], MD;  Location: [facility];  Service: Endoscopy;;  colon   TOTAL HIP ARTHROPLASTY Left 08/24/2021   Procedure: TOTAL HIP ARTHROPLASTY;  Surgeon: [name], MD;  Location: [facility];  Service: Orthopedics;  Laterality: Left;   WRIST FRACTURE SURGERY     Social History   Socioeconomic History   Marital status: Married    Spouse name: Not on file   Number of children: Not on file   Years of education: Not on file   Highest education level: Not on file  Occupational History   Not on file  Tobacco Use   Smoking status: Never   Smokeless tobacco: Never  Vaping Use   Vaping Use: Never used  Substance and Sexual Activity   Alcohol use: Yes   Drug use: No   Sexual activity: Not on file  Other Topics Concern   Not on file  Social History Narrative   Not on file   Social Determinants of Health   Financial Resource Strain: Not on file  Food Insecurity: Not on file  Transportation Needs: Not on file  Physical Activity: Not on file  Stress: Not on file  Social Connections: Not on file   Family History  Problem Relation Age of Onset   Hypertension Mother    Diabetes Mother    Hypertension Father    Cancer Father    Outpatient Encounter Medications as of 02/23/2022  Medication Sig    Cholecalciferol (VITAMIN D3) 50 MCG (2000 UT) TABS Take 2,000 Units by mouth in the morning.   COD LIVER OIL PO Take 415 mg by mouth in the morning.   ELDERBERRY PO Take 225 mg by mouth in the morning.   gabapentin (NEURONTIN) 100 MG capsule    Glucosamine-Chondroitin (MOVE FREE PO) Take 1 tablet by mouth daily.   IBUPROFEN PO Take by mouth as needed.   Magnesium 250 MG TABS Take 250 mg by mouth in the morning.   polyethylene glycol (MIRALAX / GLYCOLAX) 17 g packet Take 17 g by mouth daily.   Probiotic Product (PROBIOTIC PO) Take 1 capsule by mouth in the morning.   TURMERIC PO Take 500 mg by mouth in the morning.   [DISCONTINUED] cetirizine (ZYRTEC) 10 MG tablet Take 10 mg by mouth daily as needed for allergies.   Facility-Administered Encounter Medications as of 02/23/2022  Medication   bupivacaine-meloxicam ER (ZYNRELEF) injection 400 mg   ALLERGIES: No Known Allergies  VACCINATION STATUS: Immunization History  Administered Date(s) Administered  Influenza, High Dose Seasonal PF 05/11/2021   Influenza-Unspecified 05/22/2013, 04/10/2014, 04/08/2016, 06/08/2017, 05/04/2018, 05/07/2019, 05/25/2020   Moderna Sars-Covid-2 Vaccination 09/19/2019, 10/17/2019   PNEUMOCOCCAL CONJUGATE-20 12/16/2021   Tdap 09/12/2018   Zoster Recombinat (Shingrix) 09/27/2017, 01/08/2018    HPI Dillon [name] is 66 y.o. male who presents today with a medical history as above. he is being seen in follow-up after he was seen in consultation for elevated alkaline phosphatase requested by Coral Spikes, DO.   See notes from previous visit.  On routine blood work recently on December 01, 2021, he was found to have elevated alk phos at 148. However, his repeat labs show normalization of alk phos at 115. Patient does not have any prior history of liver problem, or bone problem.  He denies any history of Paget's disease, nor obstructive liver disease.   His status post hip replacement in January 2023.  Ankle surgery  in 2018 as well as repair of radius/ulna in 2010.  He has no new complaints since last visit.  Due to some intermittent pain, he used to take tramadol, Tylenol, ibuprofen, however recently he stopped his Tylenol.  Patient denies any recent bone deformity.  He denies any hearing deficit, nor unexplained headaches. -He stays active with regular ambulation.  He is not a smoker.  He does not drink alcohol excessively.  He is a social alcohol user.  Was found to have elevated blood pressure, however he declared whitecoat hypertension and not on regular blood pressure medications. He is on vitamin D supplement, 2000 units daily, however does not have recent vitamin D measurement.  Review of Systems  Constitutional: no recent weight gain/loss, no fatigue, no subjective hyperthermia, no subjective hypothermia Eyes: no blurry vision, no xerophthalmia ENT: no sore throat, no nodules palpated in throat, no dysphagia/odynophagia, no hoarseness Cardiovascular: no Chest Pain, no Shortness of Breath, no palpitations, no leg swelling Respiratory: no cough, no shortness of breath Gastrointestinal: no Nausea/Vomiting/Diarhhea Musculoskeletal: no muscle/joint aches Skin: no rashes Neurological: no tremors, no numbness, no tingling, no dizziness Psychiatric: no depression, no anxiety  Objective:       02/23/2022    2:41 PM 01/13/2022    2:20 PM 12/16/2021    9:04 AM  Vitals with BMI  Height $Remov'6\' 0"'GJSULC$  $Remove'6\' 0"'FXhIQlm$  $RemoveB'6\' 0"'ymmdsiCr$   Weight 226 lbs 13 oz 228 lbs 229 lbs  BMI 30.75 26.71 24.58  Systolic 099 833 825  Diastolic 78 053 88  Pulse 76 80 75    BP 136/78   Pulse 76   Ht 6' (1.829 m)   Wt 226 lb 12.8 oz (102.9 kg)   BMI 30.76 kg/m   Wt Readings from Last 3 Encounters:  02/23/22 226 lb 12.8 oz (102.9 kg)  01/13/22 228 lb (103.4 kg)  12/16/21 229 lb (103.9 kg)    Physical Exam  Constitutional:  Body mass index is 30.76 kg/m.,  not in acute distress, normal state of mind Eyes: PERRLA, EOMI, no  exophthalmos ENT: moist mucous membranes, no gross thyromegaly, no gross cervical lymphadenopathy   CMP ( most recent) CMP     Component Value Date/Time   NA 141 02/02/2022 0811   K 4.4 02/02/2022 0811   CL 105 02/02/2022 0811   CO2 23 02/02/2022 0811   GLUCOSE 102 (H) 02/02/2022 0811   GLUCOSE 108 (H) 08/25/2021 0655   BUN 15 02/02/2022 0811   CREATININE 0.82 02/02/2022 0811   CALCIUM 9.0 02/02/2022 0811   PROT 6.7 02/02/2022 0811   ALBUMIN  4.2 02/02/2022 0811   AST 22 02/02/2022 0811   ALT 15 02/02/2022 0811   ALKPHOS 115 02/02/2022 0811   BILITOT 0.6 02/02/2022 0811   GFRNONAA >60 08/25/2021 0655   GFRAA 112 11/27/2019 1056      Lipid Panel ( most recent) Lipid Panel     Component Value Date/Time   CHOL 166 12/01/2021 0814   TRIG 72 12/01/2021 0814   HDL 58 12/01/2021 0814   CHOLHDL 2.9 12/01/2021 0814   LDLCALC 94 12/01/2021 0814   LABVLDL 14 12/01/2021 0814      Assessment & Plan:   1. Alkaline phosphatase elevation-now resolved  He did have transient, unexplained elevation of alk phos which has normalized on recent repeat labs. His GGT was determined to be normal on December 03, 2021 indicating unlikely source of bone for alk phos.  His blood type is O+.  Review of his records does not support obstructive liver disease either. Patient does not have clear etiology for elevation of alk phos, will not be committed for further work-up until repeat tests. He does not have evidence of Paget's disease.  No personal or recorded history of bone or hematologic malignancy.  He will have vitamin D measured. He will return as needed.  If alk phos continues to rise, he will be considered for bone scan.   - I did not initiate any new prescriptions today. - he is advised to maintain close follow up with Coral Spikes, DO for primary care needs.   I spent 22 minutes in the care of the patient today including review of labs from Thyroid Function, CMP, and other relevant labs  ; imaging/biopsy records (current and previous including abstractions from other facilities); face-to-face time discussing  his lab results and symptoms, medications doses, his options of short and long term treatment based on the latest standards of care / guidelines;   and documenting the encounter.  Eulogio Bear  participated in the discussions, expressed understanding, and voiced agreement with the above plans.  All questions were answered to his satisfaction. he is encouraged to contact clinic should he have any questions or concerns prior to his return visit.   Follow up plan: Return if symptoms worsen or fail to improve.   Glade Lloyd, MD [facility] [address] [place], [place] [address] Phone: [phone]  Fax: [phone]     02/23/2022, 6:12 PM  This note was partially dictated with voice recognition software. Similar sounding words can be transcribed inadequately or may not  be corrected upon review.

## 2022-03-23 ENCOUNTER — Ambulatory Visit (INDEPENDENT_AMBULATORY_CARE_PROVIDER_SITE_OTHER): Payer: BC Managed Care – PPO | Admitting: Family Medicine

## 2022-03-23 ENCOUNTER — Encounter: Payer: Self-pay | Admitting: Family Medicine

## 2022-03-23 VITALS — BP 136/80 | Wt 230.0 lb

## 2022-03-23 DIAGNOSIS — L0291 Cutaneous abscess, unspecified: Secondary | ICD-10-CM

## 2022-03-23 MED ORDER — DOXYCYCLINE HYCLATE 100 MG PO TABS: 100.0000 mg | ORAL_TABLET | Freq: Two times a day (BID) | ORAL | 0 refills | Status: DC

## 2022-03-23 MED ORDER — MUPIROCIN 2 % EX OINT: TOPICAL_OINTMENT | CUTANEOUS | 0 refills | Status: DC

## 2022-03-23 NOTE — Progress Notes (Signed)
   Subjective:    Patient ID: Dillon [name], male    DOB: [birth year], 66 y.o.   MRN: [id number]  HPI Pt arrives today for sore on belt line. Pt states it may be MRSA or staph. Pt states it has been going on about 12 days. No unusual symptoms.  He states that the area started off as a small sore then it became red and tender he has a history of MRSA.  States he was treated with Bactrim for 10 days finishing up the Bactrim and still red and slightly tender and drained some Review of Systems     Objective:   Physical Exam Abdomen area looks normal lower abdomen has what appears to be folliculitis versus a small abscess that has drained there is some redness and granulation around it but no sign of retained abscess at this time       Assessment & Plan:  Consistent with a possible folliculitis versus small abscess that has drained with underlying possible MRSA recommend doxycycline twice daily proper way to take medication plus also avoidance of excessive sun was discussed culture was sent follow-up if problems

## 2022-03-27 LAB — WOUND CULTURE: Organism ID, Bacteria: NONE SEEN

## 2022-04-22 ENCOUNTER — Encounter (HOSPITAL_COMMUNITY): Payer: Self-pay | Admitting: *Deleted

## 2022-04-22 ENCOUNTER — Encounter: Payer: Self-pay | Admitting: Family Medicine

## 2022-04-22 ENCOUNTER — Other Ambulatory Visit: Payer: Self-pay

## 2022-04-22 ENCOUNTER — Ambulatory Visit (INDEPENDENT_AMBULATORY_CARE_PROVIDER_SITE_OTHER): Payer: BC Managed Care – PPO | Admitting: Family Medicine

## 2022-04-22 ENCOUNTER — Emergency Department (HOSPITAL_COMMUNITY)
Admission: EM | Admit: 2022-04-22 | Discharge: 2022-04-22 | Disposition: A | Discharge status: Home / Self Care | Payer: BC Managed Care – PPO | Attending: Emergency Medicine | Admitting: Emergency Medicine

## 2022-04-22 DIAGNOSIS — W540XXA Bitten by dog, initial encounter: Secondary | ICD-10-CM | POA: Diagnosis not present

## 2022-04-22 DIAGNOSIS — Z203 Contact with and (suspected) exposure to rabies: Secondary | ICD-10-CM | POA: Diagnosis not present

## 2022-04-22 DIAGNOSIS — T148XXA Other injury of unspecified body region, initial encounter: Secondary | ICD-10-CM | POA: Diagnosis not present

## 2022-04-22 DIAGNOSIS — S60811A Abrasion of right wrist, initial encounter: Secondary | ICD-10-CM | POA: Diagnosis not present

## 2022-04-22 DIAGNOSIS — Z2914 Encounter for prophylactic rabies immune globin: Secondary | ICD-10-CM | POA: Diagnosis not present

## 2022-04-22 DIAGNOSIS — S61531A Puncture wound without foreign body of right wrist, initial encounter: Secondary | ICD-10-CM | POA: Insufficient documentation

## 2022-04-22 DIAGNOSIS — S6991XA Unspecified injury of right wrist, hand and finger(s), initial encounter: Secondary | ICD-10-CM | POA: Diagnosis present

## 2022-04-22 DIAGNOSIS — Z23 Encounter for immunization: Secondary | ICD-10-CM | POA: Insufficient documentation

## 2022-04-22 MED ORDER — RABIES VACCINE, PCEC IM SUSR
1.0000 mL | Freq: Once | INTRAMUSCULAR | Status: AC
  Administered 2022-04-22: 1 mL via INTRAMUSCULAR
  Filled 2022-04-22: qty 1

## 2022-04-22 MED ORDER — AMOXICILLIN-POT CLAVULANATE 875-125 MG PO TABS: 1.0000 | ORAL_TABLET | Freq: Two times a day (BID) | ORAL | 0 refills | Status: DC

## 2022-04-22 MED ORDER — RABIES IMMUNE GLOBULIN 150 UNIT/ML IM INJ
20.0000 [IU]/kg | INJECTION | Freq: Once | INTRAMUSCULAR | Status: AC
  Administered 2022-04-22: 2100 [IU]
  Filled 2022-04-22: qty 20

## 2022-04-22 NOTE — ED Notes (Signed)
Pt instructed he needs to wait for 20 minutes prior to leaving for observation from the vaccine administration.

## 2022-04-22 NOTE — Assessment & Plan Note (Signed)
Patient is a puncture wound secondary to animal bite.  Placing on prophylactic antibiotic therapy with Augmentin.  Patient is to go to the ER for rabies vaccination and immunoglobulin.

## 2022-04-22 NOTE — Progress Notes (Signed)
Subjective:  Patient ID: Dillon [name], male    DOB: May 08, 1956  Age: 66 y.o. MRN: [id number]  CC: Chief Complaint  Patient presents with   Animal Bite    This morning by a german shepard , last dog rabies shot expired 9 /2022 [place] [address] on right side and right side ribs are in pain    HPI:  66 year old male presents for evaluation of the above.  Patient states that he was was breaking up a dog fight this morning between his dog and another dog.  He was inadvertently bit to his right wrist on the dorsal aspect.  He also fell during this encounter and feels that he bruised his right sided ribs.  He states that he has pain when he takes a deep breath.  His wound is not currently bleeding.  He has some discomfort.  He called police and animal control has been notified.  He reports that the dog is not current on his vaccination.  He states that he comes in for evaluation so that he can get antibiotic therapy.  He is inquiring about rabies as well.  Patient Active Problem List   Diagnosis Date Noted   Puncture wound of right wrist 04/22/2022   Annual physical exam 12/16/2021   Alkaline phosphatase elevation 12/16/2021   S/P hip replacement, left 08/24/21 09/02/2021   Chronic constipation 07/27/2021   Elevated BP without diagnosis of hypertension 07/27/2021    Social Hx   Social History   Socioeconomic History   Marital status: Married    Spouse name: Not on file   Number of children: Not on file   Years of education: Not on file   Highest education level: Not on file  Occupational History   Not on file  Tobacco Use   Smoking status: Never   Smokeless tobacco: Never  Vaping Use   Vaping Use: Never used  Substance and Sexual Activity   Alcohol use: Yes   Drug use: No   Sexual activity: Not on file  Other Topics Concern   Not on file  Social History Narrative   Not on file   Social Determinants of Health   Financial Resource Strain: Not on file  Food Insecurity: Not on  file  Transportation Needs: Not on file  Physical Activity: Not on file  Stress: Not on file  Social Connections: Not on file    Review of Systems Per HPI  Objective:  BP (!) 158/97   Pulse 66   Temp 97.8 F (36.6 C)   Ht 6' (1.829 m)   Wt 233 lb (105.7 kg)   SpO2 97%   BMI 31.60 kg/m      04/22/2022    2:51 PM 04/22/2022    1:49 PM 03/23/2022    4:30 PM  BP/Weight  Systolic BP 882 800 349  Diastolic BP 179 97 80  Wt. (Lbs) 228 233   BMI 30.92 kg/m2 31.6 kg/m2     Physical Exam Vitals and nursing note reviewed.  Constitutional:      General: He is not in acute distress. HENT:     Head: Normocephalic and atraumatic.  Cardiovascular:     Rate and Rhythm: Normal rate and regular rhythm.  Pulmonary:     Effort: Pulmonary effort is normal.     Breath sounds: Normal breath sounds.  Skin:    Comments: Small puncture wound to the dorsum of the right wrist.  Neurological:     Mental Status: He is alert.  Psychiatric:        Mood and Affect: Mood normal.        Behavior: Behavior normal.     Lab Results  Component Value Date   WBC 6.5 12/01/2021   HGB 13.4 12/01/2021   HCT 40.5 12/01/2021   PLT 219 12/01/2021   GLUCOSE 102 (H) 02/02/2022   CHOL 166 12/01/2021   TRIG 72 12/01/2021   HDL 58 12/01/2021   LDLCALC 94 12/01/2021   ALT 15 02/02/2022   AST 22 02/02/2022   NA 141 02/02/2022   K 4.4 02/02/2022   CL 105 02/02/2022   CREATININE 0.82 02/02/2022   BUN 15 02/02/2022   CO2 23 02/02/2022     Assessment & Plan:   Problem List Items Addressed This Visit       Other   RESOLVED: Puncture wound   Puncture wound of right wrist    Patient is a puncture wound secondary to animal bite.  Placing on prophylactic antibiotic therapy with Augmentin.  Patient is to go to the ER for rabies vaccination and immunoglobulin.       Meds ordered this encounter  Medications   amoxicillin-clavulanate (AUGMENTIN) 875-125 MG tablet    Sig: Take 1 tablet by mouth 2  (two) times daily.    Dispense:  14 tablet    Refill:  0    Follow-up:  PRN  [facility]

## 2022-04-22 NOTE — ED Triage Notes (Addendum)
Here for rabies shot. Reports bitten by [place] dog this morning at [phone]. Bite abrasion noted to R wrist. [place] [address] and landed on L scapula. Reported to animal control. Dog in custody/ quarantine. Seen by PCP and instructed to come to ED for rabies shot. Here for rabies shot. No h/o previous rabies vaccine.

## 2022-04-22 NOTE — Discharge Instructions (Signed)
Take the Augmentin prescribed to you by the primary care provider  Follow the vaccination schedule for further rabies vaccine

## 2022-04-22 NOTE — ED Notes (Signed)
Pt not in vertical area at this time- pt did not notify staff prior to leaving- pt did not receive discharge paperwork

## 2022-04-22 NOTE — ED Notes (Signed)
Meds given, pt moved back to vertical area to wait for shot time.

## 2022-04-22 NOTE — Patient Instructions (Signed)
Rest.  Keep it clean.  Antibiotic as prescribed.  You have to go to the ER for initial rabies prophylaxis.

## 2022-04-22 NOTE — ED Provider Notes (Signed)
[facility] Provider Note   CSN: [id number] Arrival date & time: 04/22/22  1415     History  Chief Complaint  Patient presents with   needs rabies shot    Dillon [name] is a 66 y.o. male here for evaluation of dog bite.  Occurred this morning.  Has abrasion noted to right wrist.  Did fall when the bite occurred however he denies his head, LOC or anticoagulation.  He denies any other pain.  This was reported to animal control and the dog is currently under quarantine.  Patient was seen by his PCP who updated tetanus and started on antibiotics.  He is supposed to follow-up here for rabies series.  Has any other complaints.  Cleaned wound PTA.  No headache, fever, numbness or weakness  HPI     Home Medications Prior to Admission medications   Medication Sig Start Date End Date Taking? Authorizing Provider  amoxicillin-clavulanate (AUGMENTIN) 875-125 MG tablet Take 1 tablet by mouth 2 (two) times daily. 04/22/22   Coral Spikes, DO  Cholecalciferol (VITAMIN D3) 50 MCG (2000 UT) TABS Take 2,000 Units by mouth in the morning.    [provider]  COD LIVER OIL PO Take 415 mg by mouth in the morning.    [provider]  ELDERBERRY PO Take 225 mg by mouth in the morning.    [provider]  gabapentin (NEURONTIN) 100 MG capsule  02/14/22   [provider]  Glucosamine-Chondroitin (MOVE FREE PO) Take 1 tablet by mouth daily.    [provider]  IBUPROFEN PO Take by mouth as needed.    [provider]  Magnesium 250 MG TABS Take 250 mg by mouth in the morning.    [provider]  mupirocin ointment (BACTROBAN) 2 % Apply thin amount to wound 03/23/22   Luking, Elayne Snare, MD  polyethylene glycol (MIRALAX / GLYCOLAX) 17 g packet Take 17 g by mouth daily. 08/26/21   [name], MD  Probiotic Product (PROBIOTIC PO) Take 1 capsule by mouth in the morning.    [provider]  TURMERIC PO Take 500 mg by  mouth in the morning.    [provider]      Allergies    Patient has no known allergies.    Review of Systems   Review of Systems  Constitutional: Negative.   HENT: Negative.    Respiratory: Negative.    Cardiovascular: Negative.   Gastrointestinal: Negative.   Genitourinary: Negative.   Musculoskeletal:        Right wrist abrasion  Skin:  Positive for wound.  Neurological: Negative.   All other systems reviewed and are negative.  Physical Exam Updated Vital Signs BP (!) 158/103 (BP Location: Right Arm)   Pulse 70   Temp 97.9 F (36.6 C) (Oral)   Resp 17   Ht 6' (1.829 m)   Wt 103.4 kg   SpO2 99%   BMI 30.92 kg/m  Physical Exam Vitals and nursing note reviewed.  Constitutional:      General: He is not in acute distress.    Appearance: He is well-developed. He is not ill-appearing, toxic-appearing or diaphoretic.  HENT:     Head: Normocephalic and atraumatic.  Eyes:     Pupils: Pupils are equal, round, and reactive to light.  Cardiovascular:     Rate and Rhythm: Normal rate and regular rhythm.  Pulmonary:     Effort: Pulmonary effort is normal. No respiratory distress.  Abdominal:     General: There is no distension.     Palpations: Abdomen is soft.  Musculoskeletal:        General: Normal range of motion.     Cervical back: Normal range of motion and neck supple.     Comments: No bony tenderness, full range of motion, compartment soft  Skin:    General: Skin is warm and dry.     Comments: Right wrist abrasion  Neurological:     General: No focal deficit present.     Mental Status: He is alert and oriented to person, place, and time.     ED Results / Procedures / Treatments   Labs (all labs ordered are listed, but only abnormal results are displayed) Labs Reviewed - No data to display  EKG None  Radiology No results found.  Procedures Procedures    Medications Ordered in ED Medications  rabies immune globulin (HYPERRAB/KEDRAB)  injection 2,100 Units (has no administration in time range)  rabies vaccine (RABAVERT) injection 1 mL (has no administration in time range)   ED Course/ Medical Decision Making/ A&P    66 year old here for evaluation of dog bite.  Occurred this morning.  Initially seen by PCP started on antibiotics, updated tetanus.  Did not have the series at PCP office subsequently sent here.  He has no bony tenderness, full range of motion, ambulatory.  Dog is currently in quarantine.  We will plan on giving rabies series here.  He will be given letter for outpatient follow-up for further rabies vaccination.  Encouraged to take the antibiotics prescribed to him by his PCP.  The patient has been appropriately medically screened and/or stabilized in the ED. I have low suspicion for any other emergent medical condition which would require further screening, evaluation or treatment in the ED or require inpatient management.  Patient is hemodynamically stable and in no acute distress.  Patient able to ambulate in department prior to ED.  Evaluation does not show acute pathology that would require ongoing or additional emergent interventions while in the emergency department or further inpatient treatment.  I have discussed the diagnosis with the patient and answered all questions.  Pain is been managed while in the emergency department and patient has no further complaints prior to discharge.  Patient is comfortable with plan discussed in room and is stable for discharge at this time.  I have discussed strict return precautions for returning to the emergency department.  Patient was encouraged to follow-up with PCP/specialist refer to at discharge.                            Medical Decision Making Amount and/or Complexity of Data Reviewed External Data Reviewed: labs and notes.  Risk OTC drugs. Prescription drug management. Diagnosis or treatment significantly limited by social determinants of  health.          Final Clinical Impression(s) / ED Diagnoses Final diagnoses:  Animal bite    Rx / DC Orders ED Discharge Orders     None         Malynda Smolinski A, PA-C 04/22/22 1652    Dorie Rank, MD 04/23/22 0041

## 2022-04-25 ENCOUNTER — Ambulatory Visit: Payer: Self-pay

## 2022-04-25 ENCOUNTER — Encounter: Payer: Self-pay | Admitting: Family Medicine

## 2022-04-25 ENCOUNTER — Ambulatory Visit
Admission: EM | Admit: 2022-04-25 | Discharge: 2022-04-25 | Disposition: A | Discharge status: Home / Self Care | Payer: BC Managed Care – PPO | Attending: Family Medicine | Admitting: Family Medicine

## 2022-04-25 DIAGNOSIS — Z23 Encounter for immunization: Secondary | ICD-10-CM

## 2022-04-25 DIAGNOSIS — Z203 Contact with and (suspected) exposure to rabies: Secondary | ICD-10-CM | POA: Diagnosis not present

## 2022-04-25 MED ORDER — RABIES VACCINE, PCEC IM SUSR
1.0000 mL | Freq: Once | INTRAMUSCULAR | Status: AC
  Administered 2022-04-25: 1 mL via INTRAMUSCULAR

## 2022-04-25 NOTE — ED Notes (Signed)
Pt presents for rabies vaccine denies complaints.

## 2022-04-29 ENCOUNTER — Ambulatory Visit
Admission: RE | Admit: 2022-04-29 | Discharge: 2022-04-29 | Disposition: A | Discharge status: Home / Self Care | Payer: BC Managed Care – PPO | Source: Ambulatory Visit | Attending: Family Medicine | Admitting: Family Medicine

## 2022-04-29 DIAGNOSIS — Z203 Contact with and (suspected) exposure to rabies: Secondary | ICD-10-CM

## 2022-04-29 MED ORDER — RABIES VACCINE, PCEC IM SUSR
1.0000 mL | Freq: Once | INTRAMUSCULAR | Status: AC
  Administered 2022-04-29: 1 mL via INTRAMUSCULAR

## 2022-04-29 NOTE — ED Triage Notes (Signed)
Pt presents for Rabies vaccine #3.

## 2022-05-06 ENCOUNTER — Ambulatory Visit
Admission: RE | Admit: 2022-05-06 | Discharge: 2022-05-06 | Disposition: A | Discharge status: Home / Self Care | Payer: BC Managed Care – PPO | Source: Ambulatory Visit | Attending: Emergency Medicine | Admitting: Emergency Medicine

## 2022-05-06 MED ORDER — RABIES VACCINE, PCEC IM SUSR
1.0000 mL | Freq: Once | INTRAMUSCULAR | Status: AC
  Administered 2022-05-06: 1 mL via INTRAMUSCULAR

## 2022-05-06 NOTE — ED Triage Notes (Signed)
Pt presents for rabies vaccine #4

## 2022-06-09 ENCOUNTER — Other Ambulatory Visit: Payer: Self-pay | Admitting: Family Medicine

## 2022-06-09 ENCOUNTER — Encounter: Payer: Self-pay | Admitting: Family Medicine

## 2022-06-09 MED ORDER — AMLODIPINE BESYLATE 2.5 MG PO TABS: 2.5000 mg | ORAL_TABLET | Freq: Every day | ORAL | 3 refills | Status: DC

## 2022-07-28 ENCOUNTER — Ambulatory Visit
Admission: EM | Admit: 2022-07-28 | Discharge: 2022-07-28 | Disposition: A | Discharge status: Home / Self Care | Payer: BC Managed Care – PPO | Attending: Family Medicine | Admitting: Family Medicine

## 2022-07-28 ENCOUNTER — Encounter: Payer: Self-pay | Admitting: Emergency Medicine

## 2022-07-28 DIAGNOSIS — R509 Fever, unspecified: Secondary | ICD-10-CM | POA: Diagnosis not present

## 2022-07-28 DIAGNOSIS — J069 Acute upper respiratory infection, unspecified: Secondary | ICD-10-CM | POA: Diagnosis not present

## 2022-07-28 MED ORDER — FLUTICASONE PROPIONATE 50 MCG/ACT NA SUSP: 1.0000 | Freq: Two times a day (BID) | NASAL | 2 refills | Status: DC

## 2022-07-28 MED ORDER — PROMETHAZINE-DM 6.25-15 MG/5ML PO SYRP: 5.0000 mL | ORAL_SOLUTION | Freq: Four times a day (QID) | ORAL | 0 refills | Status: DC | PRN

## 2022-07-28 MED ORDER — OSELTAMIVIR PHOSPHATE 75 MG PO CAPS: 75.0000 mg | ORAL_CAPSULE | Freq: Two times a day (BID) | ORAL | 0 refills | Status: DC

## 2022-07-28 NOTE — ED Triage Notes (Signed)
Body aches, low grade fever, headache, cough, sneezing, since 4am this morning

## 2022-07-28 NOTE — ED Provider Notes (Signed)
[facility]    CSN: [id number] Arrival date & time: 07/28/22  1607      History   Chief Complaint No chief complaint on file.   HPI Dillon [name] is a 66 y.o. male.   Presenting today with sudden onset body aches, fever, headache, cough, sneezing, scratchy throat started around 4 AM this morning.  Denies chest pain, shortness of breath, abdominal pain, nausea vomiting or diarrhea.  So far trying cold and flu medication with minimal relief of symptoms.  No known sick contacts recently.    Past Medical History:  Diagnosis Date   Hypertension    Lumbar spondylosis     Patient Active Problem List   Diagnosis Date Noted   Puncture wound of right wrist 04/22/2022   Annual physical exam 12/16/2021   Alkaline phosphatase elevation 12/16/2021   S/P hip replacement, left 08/24/21 09/02/2021   Chronic constipation 07/27/2021   Elevated BP without diagnosis of hypertension 07/27/2021    Past Surgical History:  Procedure Laterality Date   COLONOSCOPY N/A 01/24/2019   Procedure: COLONOSCOPY;  Surgeon: [name], MD;  Location: [facility];  Service: Endoscopy;  Laterality: N/A;  1030   HAND SURGERY Left    closed reduction left hand   JOINT REPLACEMENT Right    Ankle replacement   KNEE SURGERY     POLYPECTOMY  01/24/2019   Procedure: POLYPECTOMY;  Surgeon: [name], MD;  Location: [facility];  Service: Endoscopy;;  colon   TOTAL HIP ARTHROPLASTY Left 08/24/2021   Procedure: TOTAL HIP ARTHROPLASTY;  Surgeon: [name], MD;  Location: [facility];  Service: Orthopedics;  Laterality: Left;   WRIST FRACTURE SURGERY         Home Medications    Prior to Admission medications   Medication Sig Start Date End Date Taking? Authorizing Provider  fluticasone (FLONASE) 50 MCG/ACT nasal spray Place 1 spray into both nostrils 2 (two) times daily. 07/28/22  Yes Volney American, PA-C  oseltamivir (TAMIFLU) 75 MG capsule Take 1 capsule (75 mg  total) by mouth every 12 (twelve) hours. 07/28/22  Yes Volney American, PA-C  promethazine-dextromethorphan (PROMETHAZINE-DM) 6.25-15 MG/5ML syrup Take 5 mLs by mouth 4 (four) times daily as needed. 07/28/22  Yes Volney American, PA-C  amLODipine (NORVASC) 2.5 MG tablet Take 1 tablet (2.5 mg total) by mouth daily. 06/09/22   Coral Spikes, DO  Cholecalciferol (VITAMIN D3) 50 MCG (2000 UT) TABS Take 2,000 Units by mouth in the morning.    [provider]  COD LIVER OIL PO Take 415 mg by mouth in the morning.    [provider]  ELDERBERRY PO Take 225 mg by mouth in the morning.    [provider]  gabapentin (NEURONTIN) 100 MG capsule  02/14/22   [provider]  Glucosamine-Chondroitin (MOVE FREE PO) Take 1 tablet by mouth daily.    [provider]  IBUPROFEN PO Take by mouth as needed.    [provider]  Magnesium 250 MG TABS Take 250 mg by mouth in the morning.    [provider]  mupirocin ointment (BACTROBAN) 2 % Apply thin amount to wound 03/23/22   Luking, Elayne Snare, MD  polyethylene glycol (MIRALAX / GLYCOLAX) 17 g packet Take 17 g by mouth daily. 08/26/21   [name], MD  Probiotic Product (PROBIOTIC PO) Take 1 capsule by mouth in the morning.    [provider]  TURMERIC PO Take  500 mg by mouth in the morning.    [provider]    Family History Family History  Problem Relation Age of Onset   Hypertension Mother    Diabetes Mother    Hypertension Father    Cancer Father     Social History Social History   Tobacco Use   Smoking status: Never   Smokeless tobacco: Never  Vaping Use   Vaping Use: Never used  Substance Use Topics   Alcohol use: Yes   Drug use: No     Allergies   Patient has no known allergies.   Review of Systems Review of Systems Per HPI  Physical Exam Triage Vital Signs ED Triage Vitals  Enc Vitals Group     BP 07/28/22 1640 137/85     Pulse  Rate 07/28/22 1640 (!) 104     Resp 07/28/22 1640 20     Temp 07/28/22 1640 99 F (37.2 C)     Temp Source 07/28/22 1640 Oral     SpO2 07/28/22 1640 98 %     Weight --      Height --      Head Circumference --      Peak Flow --      Pain Score 07/28/22 1642 0     Pain Loc --      Pain Edu? --      Excl. in [place]? --    No data found.  Updated Vital Signs BP 137/85 (BP Location: Right Arm)   Pulse (!) 104   Temp 99 F (37.2 C) (Oral)   Resp 20   SpO2 98%   Visual Acuity Right Eye Distance:   Left Eye Distance:   Bilateral Distance:    Right Eye Near:   Left Eye Near:    Bilateral Near:     Physical Exam Vitals and nursing note reviewed.  Constitutional:      Appearance: He is well-developed.  HENT:     Head: Atraumatic.     Right Ear: External ear normal.     Left Ear: External ear normal.     Nose: Rhinorrhea present.     Mouth/Throat:     Pharynx: Posterior oropharyngeal erythema present. No oropharyngeal exudate.  Eyes:     Conjunctiva/sclera: Conjunctivae normal.     Pupils: Pupils are equal, round, and reactive to light.  Cardiovascular:     Rate and Rhythm: Normal rate and regular rhythm.  Pulmonary:     Effort: Pulmonary effort is normal. No respiratory distress.     Breath sounds: No wheezing or rales.  Musculoskeletal:        General: Normal range of motion.     Cervical back: Normal range of motion and neck supple.  Lymphadenopathy:     Cervical: No cervical adenopathy.  Skin:    General: Skin is warm and dry.  Neurological:     Mental Status: He is alert and oriented to person, place, and time.  Psychiatric:        Behavior: Behavior normal.      UC Treatments / Results  Labs (all labs ordered are listed, but only abnormal results are displayed) Labs Reviewed - No data to display  EKG   Radiology No results found.  Procedures Procedures (including critical care time)  Medications Ordered in UC Medications - No data to  display  Initial Impression / Assessment and Plan / UC Course  I have reviewed the triage vital signs and the nursing  notes.  Pertinent labs & imaging results that were available during my care of the patient were reviewed by me and considered in my medical decision making (see chart for details).     Vitals and exam reassuring and suggestive of a viral upper respiratory infection.  Respiratory panel pending, treat with Tamiflu, Phenergan DM, Flonase, supportive over-the-counter medications and home care.  Return for worsening symptoms.  Final Clinical Impressions(s) / UC Diagnoses   Final diagnoses:  Viral URI with cough  Fever, unspecified   Discharge Instructions   None    ED Prescriptions     Medication Sig Dispense Auth. Provider   oseltamivir (TAMIFLU) 75 MG capsule Take 1 capsule (75 mg total) by mouth every 12 (twelve) hours. 10 capsule Volney American, [place]   promethazine-dextromethorphan (PROMETHAZINE-DM) 6.25-15 MG/5ML syrup Take 5 mLs by mouth 4 (four) times daily as needed. 100 mL Volney American, PA-C   fluticasone [facility]) 50 MCG/ACT nasal spray Place 1 spray into both nostrils 2 (two) times daily. 16 g Volney American, [place]      PDMP not reviewed this encounter.   Volney American, [place] 07/28/22 1726

## 2022-08-24 ENCOUNTER — Ambulatory Visit: Payer: BC Managed Care – PPO | Admitting: Orthopedic Surgery

## 2022-08-24 ENCOUNTER — Ambulatory Visit (INDEPENDENT_AMBULATORY_CARE_PROVIDER_SITE_OTHER): Payer: BC Managed Care – PPO

## 2022-08-24 VITALS — Ht 72.0 in

## 2022-08-24 DIAGNOSIS — Z96642 Presence of left artificial hip joint: Secondary | ICD-10-CM | POA: Diagnosis not present

## 2022-08-24 DIAGNOSIS — M1710 Unilateral primary osteoarthritis, unspecified knee: Secondary | ICD-10-CM

## 2022-08-24 DIAGNOSIS — M1612 Unilateral primary osteoarthritis, left hip: Secondary | ICD-10-CM

## 2022-08-24 DIAGNOSIS — M25461 Effusion, right knee: Secondary | ICD-10-CM | POA: Diagnosis not present

## 2022-08-24 DIAGNOSIS — M1711 Unilateral primary osteoarthritis, right knee: Secondary | ICD-10-CM

## 2022-08-24 MED ORDER — METHYLPREDNISOLONE ACETATE 40 MG/ML IJ SUSP
40.0000 mg | Freq: Once | INTRAMUSCULAR | Status: AC
  Administered 2022-08-24: 40 mg via INTRA_ARTICULAR

## 2022-08-24 NOTE — Progress Notes (Addendum)
FOLLOW UP   Encounter Diagnoses  Name Primary?   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  He is walking about 2 miles a day for exercise and gets about 11,000 steps in.  He still playing golf.  He does have intermittent pain in his right knee with chronic effusion osteoarthritis as an underlying diagnosis.  The intensity of the pain seems to come and go some days it does not hurt some days it does  He also has had a right ankle replacement and has some thickening and fusiform swelling of his Achilles 2 cm proximal to its insertion which he notices pain on level ground  His left hip x-ray shows a stable implant with no signs of loosening and good component position  The right knee is swollen and tender his knee flexion is approximately 115 degrees and he has a valgus knee with lateral pain  He is amenable to aspiration injection right knee with total knee at a time convenient to him and when his symptoms are more disabling  In terms of his follow-up he will follow-up on an as-needed basis for aspiration injections of the right knee until he is ready for surgery  Procedure note injection and aspiration right knee joint  Verbal consent was obtained to aspirate and inject the right knee joint   Timeout was completed to confirm the site of aspiration and injection  An 18-gauge needle was used to aspirate the knee joint from a suprapatellar lateral approach.  The medications used were 40 mg of Depo-Medrol and 1% lidocaine 3 cc  Anesthesia was provided by ethyl chloride and the skin was prepped with  alcohol.  After cleaning the skin with alcohol an 18-gauge needle was used to aspirate the right knee joint.  We obtained 60  cc of fluid CLR YEL  We follow this by injection of 40 mg of Depo-Medrol and 3 cc 1% lidocaine.  There were no complications. A sterile bandage was applied.

## 2022-08-24 NOTE — Addendum Note (Signed)
Addended by: Moreen Fowler R on: 08/24/2022 10:45 AM   Modules accepted: Orders

## 2022-08-24 NOTE — Patient Instructions (Signed)
you have received an injection of steroids into the joint. 15% of patients will have increased pain within the 24 hours postinjection.   This is transient and will go away.   We recommend that you use ice packs on the injection site for 20 minutes every 2 hours and extra strength Tylenol 2 tablets every 8 as needed until the pain resolves.  If you continue to have pain after taking the Tylenol and using the ice please call the office for further instructions.

## 2022-10-06 ENCOUNTER — Encounter: Payer: Self-pay | Admitting: Radiology

## 2022-11-12 IMAGING — DX DG PORTABLE PELVIS
1 series · 1 of 1 positions shown · non-contrast
Comparison: Radiographs 07/19/2021

CLINICAL DATA: Left total hip arthroplasty.

EXAM:
PORTABLE PELVIS 1-2 VIEWS

[pelvis ap]
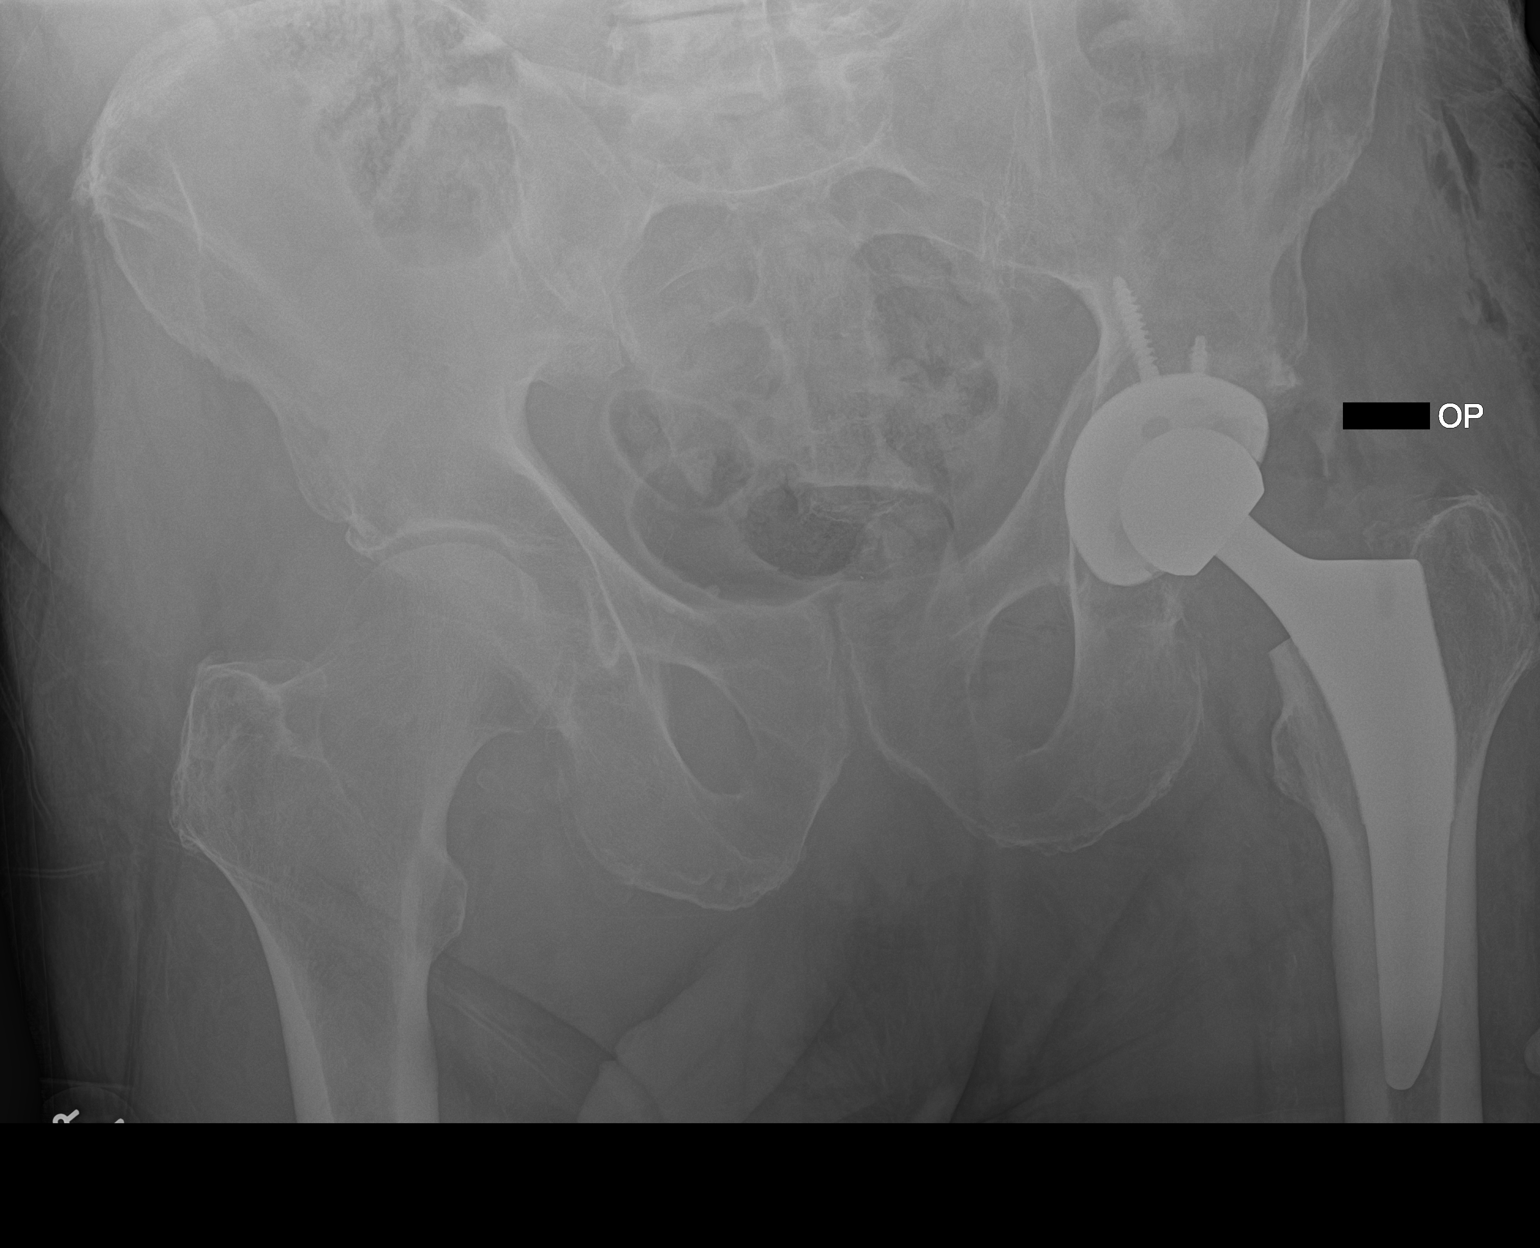

[1 of 1 positions shown; findings below may reference images not displayed]

FINDINGS: The left hip prosthesis is well seated. No complicating features are
identified. Stable mild degenerative changes involving the right
hip.
IMPRESSION: Well seated left hip prosthesis without complicating features.

## 2022-12-02 DIAGNOSIS — Z13220 Encounter for screening for lipoid disorders: Secondary | ICD-10-CM

## 2022-12-02 DIAGNOSIS — Z79899 Other long term (current) drug therapy: Secondary | ICD-10-CM

## 2022-12-02 DIAGNOSIS — R030 Elevated blood-pressure reading, without diagnosis of hypertension: Secondary | ICD-10-CM

## 2022-12-02 DIAGNOSIS — Z125 Encounter for screening for malignant neoplasm of prostate: Secondary | ICD-10-CM

## 2022-12-05 NOTE — Telephone Encounter (Signed)
Cook, Jayce G, DO     CBC, CMP, Lipid, PSA.   

## 2022-12-09 LAB — CBC WITH DIFFERENTIAL/PLATELET
Basophils Absolute: 0 10*3/uL (ref 0.0–0.2)
Basos: 1 %
EOS (ABSOLUTE): 0.2 10*3/uL (ref 0.0–0.4)
Eos: 3 %
Hematocrit: 42.6 % (ref 37.5–51.0)
Hemoglobin: 14.3 g/dL (ref 13.0–17.7)
Immature Grans (Abs): 0 10*3/uL (ref 0.0–0.1)
Immature Granulocytes: 0 %
Lymphocytes Absolute: 1.3 10*3/uL (ref 0.7–3.1)
Lymphs: 27 %
MCH: 29.9 pg (ref 26.6–33.0)
MCHC: 33.6 g/dL (ref 31.5–35.7)
MCV: 89 fL (ref 79–97)
Monocytes Absolute: 0.4 10*3/uL (ref 0.1–0.9)
Monocytes: 8 %
Neutrophils Absolute: 3 10*3/uL (ref 1.4–7.0)
Neutrophils: 61 %
Platelets: 208 10*3/uL (ref 150–450)
RBC: 4.79 x10E6/uL (ref 4.14–5.80)
RDW: 12.7 % (ref 11.6–15.4)
WBC: 5 10*3/uL (ref 3.4–10.8)

## 2022-12-09 LAB — CMP14+EGFR
ALT: 19 IU/L (ref 0–44)
AST: 23 IU/L (ref 0–40)
Albumin/Globulin Ratio: 2 (ref 1.2–2.2)
Albumin: 4.3 g/dL (ref 3.9–4.9)
Alkaline Phosphatase: 116 IU/L (ref 44–121)
BUN/Creatinine Ratio: 16 (ref 10–24)
BUN: 14 mg/dL (ref 8–27)
Bilirubin Total: 0.5 mg/dL (ref 0.0–1.2)
CO2: 22 mmol/L (ref 20–29)
Calcium: 9.7 mg/dL (ref 8.6–10.2)
Chloride: 103 mmol/L (ref 96–106)
Creatinine, Ser: 0.9 mg/dL (ref 0.76–1.27)
Globulin, Total: 2.2 g/dL (ref 1.5–4.5)
Glucose: 103 mg/dL — ABNORMAL HIGH (ref 70–99)
Potassium: 4.3 mmol/L (ref 3.5–5.2)
Sodium: 141 mmol/L (ref 134–144)
Total Protein: 6.5 g/dL (ref 6.0–8.5)
eGFR: 94 mL/min/{1.73_m2} (ref >59)

## 2022-12-09 LAB — LIPID PANEL
Chol/HDL Ratio: 3.2 ratio (ref 0.0–5.0)
Cholesterol, Total: 173 mg/dL (ref 100–199)
HDL: 54 mg/dL (ref >39)
LDL Chol Calc (NIH): 108 mg/dL — ABNORMAL HIGH (ref 0–99)
Triglycerides: 57 mg/dL (ref 0–149)
VLDL Cholesterol Cal: 11 mg/dL (ref 5–40)

## 2022-12-09 LAB — PSA: Prostate Specific Ag, Serum: 2.4 ng/mL (ref 0.0–4.0)

## 2022-12-20 ENCOUNTER — Ambulatory Visit (INDEPENDENT_AMBULATORY_CARE_PROVIDER_SITE_OTHER): Payer: BC Managed Care – PPO | Admitting: Family Medicine

## 2022-12-20 ENCOUNTER — Encounter: Payer: Self-pay | Admitting: Family Medicine

## 2022-12-20 VITALS — BP 142/86 | HR 69 | Temp 97.7°F | Ht 72.0 in | Wt 231.0 lb

## 2022-12-20 DIAGNOSIS — Z0000 Encounter for general adult medical examination without abnormal findings: Secondary | ICD-10-CM

## 2022-12-20 DIAGNOSIS — E785 Hyperlipidemia, unspecified: Secondary | ICD-10-CM | POA: Insufficient documentation

## 2022-12-20 MED ORDER — AMLODIPINE BESYLATE 5 MG PO TABS: 5.0000 mg | ORAL_TABLET | Freq: Every day | ORAL | 3 refills | Status: DC

## 2022-12-20 NOTE — Patient Instructions (Signed)
Increase amlodipine to 5 mg daily.  Follow up annually.  Take care  Dr. Adriana Simas

## 2022-12-20 NOTE — Progress Notes (Signed)
Subjective:  Patient ID: Dillon [name], male    DOB: 02/29/1956  Age: 67 y.o. MRN: [id number]  CC: Chief Complaint  Patient presents with   Annual Exam    12/08/22 ab results    HPI:  67 year old male presents for an annual exam.  Patient reports that he is doing well other than having knee pain from arthritis. Has had recent labs. Labs reviewed with patient today. Preventative health care is up to date.   BP is elevated today. We will discuss this.  Patient Active Problem List   Diagnosis Date Noted   Hyperlipidemia 12/20/2022   Annual physical exam 12/16/2021   S/P hip replacement, left 08/24/21 09/02/2021    Social Hx   Social History   Socioeconomic History   Marital status: Married    Spouse name: Not on file   Number of children: Not on file   Years of education: Not on file   Highest education level: Not on file  Occupational History   Not on file  Tobacco Use   Smoking status: Never   Smokeless tobacco: Never  Vaping Use   Vaping Use: Never used  Substance and Sexual Activity   Alcohol use: Yes   Drug use: No   Sexual activity: Not on file  Other Topics Concern   Not on file  Social History Narrative   Not on file   Social Determinants of Health   Financial Resource Strain: Not on file  Food Insecurity: Not on file  Transportation Needs: Not on file  Physical Activity: Not on file  Stress: Not on file  Social Connections: Not on file    Review of Systems Per HPI  Objective:  BP (!) 142/86   Pulse 69   Temp 97.7 F (36.5 C)   Ht 6' (1.829 m)   Wt 231 lb (104.8 kg)   SpO2 95%   BMI 31.33 kg/m      12/20/2022    1:25 PM 07/28/2022    4:40 PM 04/29/2022    2:08 PM  BP/Weight  Systolic BP 142 137 160  Diastolic BP 86 85 93  Wt. (Lbs) 231    BMI 31.33 kg/m2      Physical Exam Vitals reviewed.  Constitutional:      General: He is not in acute distress.    Appearance: He is well-developed.  HENT:     Head: Normocephalic and  atraumatic.     Nose: Nose normal.     Mouth/Throat:     Pharynx: No oropharyngeal exudate.  Eyes:     General: No scleral icterus.    Conjunctiva/sclera: Conjunctivae normal.  Neck:     Thyroid: No thyromegaly.  Cardiovascular:     Rate and Rhythm: Normal rate and regular rhythm.     Heart sounds: No murmur heard. Pulmonary:     Effort: Pulmonary effort is normal.     Breath sounds: Normal breath sounds. No wheezing or rales.  Abdominal:     General: There is no distension.     Palpations: Abdomen is soft.     Tenderness: There is no abdominal tenderness. There is no guarding or rebound.  Musculoskeletal:        General: Normal range of motion.     Cervical back: Neck supple.  Lymphadenopathy:     Cervical: No cervical adenopathy.  Skin:    General: Skin is warm and dry.     Findings: No rash.  Neurological:  Mental Status: He is alert and oriented to person, place, and time.     Lab Results  Component Value Date   WBC 5.0 12/08/2022   HGB 14.3 12/08/2022   HCT 42.6 12/08/2022   PLT 208 12/08/2022   GLUCOSE 103 (H) 12/08/2022   CHOL 173 12/08/2022   TRIG 57 12/08/2022   HDL 54 12/08/2022   LDLCALC 108 (H) 12/08/2022   ALT 19 12/08/2022   AST 23 12/08/2022   NA 141 12/08/2022   K 4.3 12/08/2022   CL 103 12/08/2022   CREATININE 0.90 12/08/2022   BUN 14 12/08/2022   CO2 22 12/08/2022     Assessment & Plan:   Problem List Items Addressed This Visit       Other   Annual physical exam - Primary    Overall doing well. Labs with mildly elevated LDL. Advised to watch diet. BP mildly elevated here today. Increasing Norvasc. Preventative health care up to date.       Meds ordered this encounter  Medications   amLODipine (NORVASC) 5 MG tablet    Sig: Take 1 tablet (5 mg total) by mouth daily.    Dispense:  90 tablet    Refill:  3    Follow-up:  Return in about 1 year (around 12/20/2023).  Everlene Other DO [facility]

## 2022-12-20 NOTE — Assessment & Plan Note (Signed)
Overall doing well. Labs with mildly elevated LDL. Advised to watch diet. BP mildly elevated here today. Increasing Norvasc. Preventative health care up to date.

## 2023-12-15 ENCOUNTER — Encounter (INDEPENDENT_AMBULATORY_CARE_PROVIDER_SITE_OTHER): Payer: Self-pay | Admitting: *Deleted

## 2023-12-19 ENCOUNTER — Encounter: Payer: Self-pay | Admitting: Family Medicine

## 2023-12-19 ENCOUNTER — Other Ambulatory Visit: Payer: Self-pay | Admitting: Family Medicine

## 2023-12-19 DIAGNOSIS — Z125 Encounter for screening for malignant neoplasm of prostate: Secondary | ICD-10-CM

## 2023-12-19 DIAGNOSIS — Z130 Encounter for screening for diseases of the blood and blood-forming organs and certain disorders involving the immune mechanism: Secondary | ICD-10-CM

## 2023-12-19 DIAGNOSIS — R7309 Other abnormal glucose: Secondary | ICD-10-CM

## 2023-12-19 DIAGNOSIS — E7800 Pure hypercholesterolemia, unspecified: Secondary | ICD-10-CM

## 2023-12-19 DIAGNOSIS — Z862 Personal history of diseases of the blood and blood-forming organs and certain disorders involving the immune mechanism: Secondary | ICD-10-CM

## 2023-12-21 ENCOUNTER — Other Ambulatory Visit: Payer: Self-pay

## 2023-12-21 ENCOUNTER — Ambulatory Visit: Payer: Self-pay | Admitting: Family Medicine

## 2023-12-21 ENCOUNTER — Other Ambulatory Visit: Payer: Self-pay | Admitting: Family Medicine

## 2023-12-21 DIAGNOSIS — Z125 Encounter for screening for malignant neoplasm of prostate: Secondary | ICD-10-CM

## 2023-12-21 LAB — CMP14+EGFR
ALT: 21 IU/L (ref 0–44)
AST: 24 IU/L (ref 0–40)
Albumin: 4.3 g/dL (ref 3.9–4.9)
Alkaline Phosphatase: 128 IU/L — ABNORMAL HIGH (ref 44–121)
BUN/Creatinine Ratio: 18 (ref 10–24)
BUN: 16 mg/dL (ref 8–27)
Bilirubin Total: 0.4 mg/dL (ref 0.0–1.2)
CO2: 23 mmol/L (ref 20–29)
Calcium: 9.6 mg/dL (ref 8.6–10.2)
Chloride: 104 mmol/L (ref 96–106)
Creatinine, Ser: 0.89 mg/dL (ref 0.76–1.27)
Globulin, Total: 2.4 g/dL (ref 1.5–4.5)
Glucose: 107 mg/dL — ABNORMAL HIGH (ref 70–99)
Potassium: 4.6 mmol/L (ref 3.5–5.2)
Sodium: 143 mmol/L (ref 134–144)
Total Protein: 6.7 g/dL (ref 6.0–8.5)
eGFR: 94 mL/min/{1.73_m2} (ref >59)

## 2023-12-21 LAB — LIPID PANEL
Chol/HDL Ratio: 2.9 ratio (ref 0.0–5.0)
Cholesterol, Total: 171 mg/dL (ref 100–199)
HDL: 59 mg/dL (ref >39)
LDL Chol Calc (NIH): 98 mg/dL (ref 0–99)
Triglycerides: 72 mg/dL (ref 0–149)
VLDL Cholesterol Cal: 14 mg/dL (ref 5–40)

## 2023-12-21 LAB — CBC
Hematocrit: 45.9 % (ref 37.5–51.0)
Hemoglobin: 15 g/dL (ref 13.0–17.7)
MCH: 29.9 pg (ref 26.6–33.0)
MCHC: 32.7 g/dL (ref 31.5–35.7)
MCV: 91 fL (ref 79–97)
Platelets: 223 10*3/uL (ref 150–450)
RBC: 5.02 x10E6/uL (ref 4.14–5.80)
RDW: 13.3 % (ref 11.6–15.4)
WBC: 5.7 10*3/uL (ref 3.4–10.8)

## 2023-12-21 NOTE — Telephone Encounter (Signed)
 Lab test ordered and ABN printed and given to patient

## 2023-12-22 LAB — PSA: Prostate Specific Ag, Serum: 2.9 ng/mL (ref 0.0–4.0)

## 2023-12-24 ENCOUNTER — Encounter: Payer: Self-pay | Admitting: Family Medicine

## 2023-12-28 ENCOUNTER — Ambulatory Visit: Admitting: Family Medicine

## 2024-01-03 ENCOUNTER — Ambulatory Visit (INDEPENDENT_AMBULATORY_CARE_PROVIDER_SITE_OTHER): Admitting: Family Medicine

## 2024-01-03 ENCOUNTER — Encounter: Payer: Self-pay | Admitting: Family Medicine

## 2024-01-03 VITALS — BP 120/78 | HR 77 | Temp 98.1°F | Ht 72.0 in | Wt 231.0 lb

## 2024-01-03 DIAGNOSIS — K219 Gastro-esophageal reflux disease without esophagitis: Secondary | ICD-10-CM | POA: Insufficient documentation

## 2024-01-03 DIAGNOSIS — Z0000 Encounter for general adult medical examination without abnormal findings: Secondary | ICD-10-CM | POA: Diagnosis not present

## 2024-01-03 DIAGNOSIS — I10 Essential (primary) hypertension: Secondary | ICD-10-CM | POA: Insufficient documentation

## 2024-01-03 MED ORDER — AMLODIPINE BESYLATE 5 MG PO TABS: 5.0000 mg | ORAL_TABLET | Freq: Every day | ORAL | 3 refills | Status: AC

## 2024-01-03 NOTE — Progress Notes (Unsigned)
   Subjective:  Patient ID: Dillon [name], male    DOB: 13-Nov-1955  Age: 68 y.o. MRN: [id number]  CC:   Chief Complaint  Patient presents with   Annual Exam    HPI:  Patient Active Problem List   Diagnosis Date Noted   Essential hypertension 01/03/2024   GERD (gastroesophageal reflux disease) 01/03/2024   Hyperlipidemia 12/20/2022   Annual physical exam 12/16/2021   S/P hip replacement, left 08/24/21 09/02/2021    Social Hx   Social History   Socioeconomic History   Marital status: Married    Spouse name: Not on file   Number of children: Not on file   Years of education: Not on file   Highest education level: Not on file  Occupational History   Not on file  Tobacco Use   Smoking status: Never   Smokeless tobacco: Never  Vaping Use   Vaping status: Never Used  Substance and Sexual Activity   Alcohol use: Yes   Drug use: No   Sexual activity: Not on file  Other Topics Concern   Not on file  Social History Narrative   Not on file   Social Drivers of Health   Financial Resource Strain: Not on file  Food Insecurity: Not on file  Transportation Needs: Not on file  Physical Activity: Not on file  Stress: Not on file  Social Connections: Not on file    Review of Systems Per HPI  Objective:  Ht 6' (1.829 m)   BMI 31.33 kg/m      12/20/2022    1:25 PM 07/28/2022    4:40 PM 04/29/2022    2:08 PM  BP/Weight  Systolic BP 142 137 160  Diastolic BP 86 85 93  Wt. (Lbs) 231    BMI 31.33 kg/m2      Physical Exam  Lab Results  Component Value Date   WBC 5.7 12/20/2023   HGB 15.0 12/20/2023   HCT 45.9 12/20/2023   PLT 223 12/20/2023   GLUCOSE 107 (H) 12/20/2023   CHOL 171 12/20/2023   TRIG 72 12/20/2023   HDL 59 12/20/2023   LDLCALC 98 12/20/2023   ALT 21 12/20/2023   AST 24 12/20/2023   NA 143 12/20/2023   K 4.6 12/20/2023   CL 104 12/20/2023   CREATININE 0.89 12/20/2023   BUN 16 12/20/2023   CO2 23 12/20/2023     Assessment & Plan:   Essential hypertension    Follow-up:  No follow-ups on file.  Kathleen Papa DO [facility]

## 2024-01-03 NOTE — Patient Instructions (Signed)
Follow up annually. ? ?Take care ? ?Dr. Kivon Aprea  ?

## 2024-01-04 NOTE — Assessment & Plan Note (Signed)
 Doing well. Planning for Colonoscopy soon (has received paperwork from GI). Labs reviewed. Norvasc  refilled.

## 2024-01-29 ENCOUNTER — Telehealth: Payer: Self-pay

## 2024-01-29 ENCOUNTER — Encounter: Payer: Self-pay | Admitting: *Deleted

## 2024-01-29 ENCOUNTER — Other Ambulatory Visit: Payer: Self-pay | Admitting: *Deleted

## 2024-01-29 MED ORDER — PEG 3350-KCL-NA BICARB-NACL 420 G PO SOLR: 4000.0000 mL | Freq: Once | ORAL | 0 refills | Status: AC

## 2024-01-29 NOTE — Telephone Encounter (Signed)
 LMOVM to return call.

## 2024-01-29 NOTE — Telephone Encounter (Signed)
 Who is your primary care physician: Jayce Cook  Reasons for the colonoscopy: Hx Polyps  Have you had a colonoscopy before?  Yes 01-24-2019 Dr.Rehman  Do you have family history of colon cancer? no  Previous colonoscopy with polyps removed? yes  Do you have a history colorectal cancer?   no  Are you diabetic? If yes, Type 1 or Type 2?    no  Do you have a prosthetic or mechanical heart valve? no  Do you have a pacemaker/defibrillator?   no  Have you had endocarditis/atrial fibrillation? no  Have you had joint replacement within the last 12 months?  no  Do you tend to be constipated or have to use laxatives? yes  Do you have any history of drugs or alchohol?  Yes alcohol socially  Do you use supplemental oxygen?  no  Have you had a stroke or heart attack within the last 6 months? no  Do you take weight loss medication?  no     Do you take any blood-thinning medications such as: (aspirin , warfarin, Plavix, Aggrenox)  no  If yes we need the name, milligram, dosage and who is prescribing doctor  Current Outpatient Medications on File Prior to Visit  Medication Sig Dispense Refill   amLODipine  (NORVASC ) 5 MG tablet Take 1 tablet (5 mg total) by mouth daily. 90 tablet 3   Cholecalciferol  (VITAMIN D3) 50 MCG (2000 UT) TABS Take 2,000 Units by mouth in the morning.     COD LIVER OIL PO Take 415 mg by mouth in the morning.     diazepam (VALIUM) 5 MG tablet Take 5 mg by mouth every 6 (six) hours as needed.     ELDERBERRY PO Take 225 mg by mouth in the morning.     famotidine (PEPCID) 20 MG tablet Take 20 mg by mouth daily.     fluticasone  (FLONASE ) 50 MCG/ACT nasal spray Place 1 spray into both nostrils 2 (two) times daily. 16 g 2   Magnesium  250 MG TABS Take 250 mg by mouth in the morning.     polyethylene glycol (MIRALAX  / GLYCOLAX ) 17 g packet Take 17 g by mouth daily. 14 each 0   RABEprazole (ACIPHEX) 20 MG tablet Take 20 mg by mouth daily.     Current Facility-Administered  Medications on File Prior to Visit  Medication Dose Route Frequency Provider Last Rate Last Admin   bupivacaine -meloxicam  ER (ZYNRELEF ) injection 400 mg  400 mg Infiltration Once Margrette Taft BRAVO, MD        No Known Allergies   Pharmacy: [facility]  Primary Insurance Name: [employer] PANCAKE  Best number where you can be reached: [phone]

## 2024-01-29 NOTE — Telephone Encounter (Signed)
 Pt has been scheduled for Thursday, 03/07/24 with Dr.Ahmed. Instructions mailed and prep sent to pharmacy.

## 2024-01-30 NOTE — Telephone Encounter (Signed)
 Questionnaire from recall, no referral needed

## 2024-02-08 ENCOUNTER — Ambulatory Visit: Admitting: Orthopedic Surgery

## 2024-02-08 DIAGNOSIS — M1711 Unilateral primary osteoarthritis, right knee: Secondary | ICD-10-CM

## 2024-02-08 DIAGNOSIS — M25461 Effusion, right knee: Secondary | ICD-10-CM

## 2024-02-08 DIAGNOSIS — M1710 Unilateral primary osteoarthritis, unspecified knee: Secondary | ICD-10-CM

## 2024-02-08 MED ORDER — METHYLPREDNISOLONE ACETATE 40 MG/ML IJ SUSP
40.0000 mg | Freq: Once | INTRAMUSCULAR | Status: AC
  Administered 2024-02-08: 40 mg via INTRA_ARTICULAR

## 2024-02-08 NOTE — Progress Notes (Signed)
 Chief Complaint  Patient presents with   Injections    R knee   Dillon [name] has arthritis of his right knee has recurrent knee effusions called for aspiration injection if possible  Patient walks 12,000 steps per day including 1-1/2 mile walk every morning  Exam shows a well-developed well-nourished male ambulates with a valgus aligned knee he has a right total ankle replacement done as well.  Right knee effusion large  Aspiration injection  Consent was obtained verbally for aspiration injection right knee  We prepped the knee with alcohol and ethyl chloride We aspirated through a superolateral portal  100 cc clear yellow fluid  Injected 40 mg of Depo-Medrol  with 2 cc 1% lidocaine   This was well-tolerated no complications  Patient will return as needed

## 2024-02-29 ENCOUNTER — Other Ambulatory Visit: Payer: Self-pay | Admitting: Medical Genetics

## 2024-03-05 ENCOUNTER — Encounter (HOSPITAL_COMMUNITY)
Admission: RE | Admit: 2024-03-05 | Discharge: 2024-03-05 | Disposition: A | Discharge status: Home / Self Care | Source: Ambulatory Visit | Attending: Gastroenterology | Admitting: Gastroenterology

## 2024-03-05 ENCOUNTER — Encounter (HOSPITAL_COMMUNITY): Payer: Self-pay

## 2024-03-05 ENCOUNTER — Other Ambulatory Visit: Payer: Self-pay

## 2024-03-05 HISTORY — DX: Unspecified osteoarthritis, unspecified site: M19.90

## 2024-03-05 HISTORY — DX: Gastro-esophageal reflux disease without esophagitis: K21.9

## 2024-03-07 ENCOUNTER — Encounter (INDEPENDENT_AMBULATORY_CARE_PROVIDER_SITE_OTHER): Payer: Self-pay | Admitting: *Deleted

## 2024-03-07 ENCOUNTER — Ambulatory Visit (HOSPITAL_COMMUNITY): Payer: Self-pay | Admitting: Anesthesiology

## 2024-03-07 ENCOUNTER — Ambulatory Visit (HOSPITAL_COMMUNITY)
Admission: RE | Admit: 2024-03-07 | Discharge: 2024-03-07 | Disposition: A | Discharge status: Home / Self Care | Attending: Gastroenterology | Admitting: Gastroenterology

## 2024-03-07 ENCOUNTER — Encounter (HOSPITAL_COMMUNITY): Payer: Self-pay | Admitting: Gastroenterology

## 2024-03-07 ENCOUNTER — Encounter (HOSPITAL_COMMUNITY)
Admission: RE | Disposition: A | Discharge status: Home / Self Care | Payer: Self-pay | Source: Home / Self Care | Attending: Gastroenterology

## 2024-03-07 DIAGNOSIS — K219 Gastro-esophageal reflux disease without esophagitis: Secondary | ICD-10-CM | POA: Diagnosis not present

## 2024-03-07 DIAGNOSIS — K5730 Diverticulosis of large intestine without perforation or abscess without bleeding: Secondary | ICD-10-CM | POA: Insufficient documentation

## 2024-03-07 DIAGNOSIS — Z1211 Encounter for screening for malignant neoplasm of colon: Secondary | ICD-10-CM | POA: Insufficient documentation

## 2024-03-07 DIAGNOSIS — Z800 Family history of malignant neoplasm of digestive organs: Secondary | ICD-10-CM | POA: Insufficient documentation

## 2024-03-07 DIAGNOSIS — I10 Essential (primary) hypertension: Secondary | ICD-10-CM | POA: Diagnosis not present

## 2024-03-07 DIAGNOSIS — D123 Benign neoplasm of transverse colon: Secondary | ICD-10-CM | POA: Diagnosis not present

## 2024-03-07 DIAGNOSIS — K648 Other hemorrhoids: Secondary | ICD-10-CM | POA: Insufficient documentation

## 2024-03-07 DIAGNOSIS — Z860100 Personal history of colon polyps, unspecified: Secondary | ICD-10-CM

## 2024-03-07 DIAGNOSIS — K635 Polyp of colon: Secondary | ICD-10-CM | POA: Diagnosis not present

## 2024-03-07 HISTORY — PX: COLONOSCOPY: SHX5424

## 2024-03-07 LAB — HM COLONOSCOPY

## 2024-03-07 SURGERY — COLONOSCOPY
Anesthesia: General

## 2024-03-07 MED ORDER — LACTATED RINGERS IV SOLN: INTRAVENOUS | Status: DC

## 2024-03-07 MED ORDER — LIDOCAINE 2% (20 MG/ML) 5 ML SYRINGE
INTRAMUSCULAR | Status: DC | PRN
  Administered 2024-03-07: 80 mg via INTRAVENOUS

## 2024-03-07 MED ORDER — PROPOFOL 10 MG/ML IV BOLUS
INTRAVENOUS | Status: DC | PRN
  Administered 2024-03-07: 50 mg via INTRAVENOUS
  Administered 2024-03-07: 30 mg via INTRAVENOUS

## 2024-03-07 MED ORDER — PROPOFOL 500 MG/50ML IV EMUL
INTRAVENOUS | Status: DC | PRN
  Administered 2024-03-07: 125 ug/kg/min via INTRAVENOUS

## 2024-03-07 MED ORDER — LACTATED RINGERS IV SOLN: INTRAVENOUS | Status: DC | PRN

## 2024-03-07 MED ORDER — EPHEDRINE SULFATE (PRESSORS) 50 MG/ML IJ SOLN
INTRAMUSCULAR | Status: DC | PRN
Start: 2024-03-07 — End: 2024-03-07
  Administered 2024-03-07 (×2): 5 mg via INTRAVENOUS

## 2024-03-07 NOTE — Anesthesia Preprocedure Evaluation (Signed)
 Anesthesia Evaluation  Patient identified by MRN, date of birth, ID band Patient awake    Reviewed: Allergy & Precautions, H&P , NPO status , Patient's Chart, lab work & pertinent test results, reviewed documented beta blocker date and time   Airway Mallampati: II  TM Distance: >3 FB Neck ROM: full    Dental no notable dental hx.    Pulmonary neg pulmonary ROS   Pulmonary exam normal breath sounds clear to auscultation       Cardiovascular Exercise Tolerance: Good hypertension,  Rhythm:regular Rate:Normal     Neuro/Psych negative neurological ROS  negative psych ROS   GI/Hepatic Neg liver ROS,GERD  ,,  Endo/Other  negative endocrine ROS    Renal/GU negative Renal ROS  negative genitourinary   Musculoskeletal   Abdominal   Peds  Hematology negative hematology ROS (+)   Anesthesia Other Findings   Reproductive/Obstetrics negative OB ROS                             Anesthesia Physical Anesthesia Plan  ASA: 2  Anesthesia Plan: General   Post-op Pain Management:    Induction:   PONV Risk Score and Plan: Propofol infusion  Airway Management Planned:   Additional Equipment:   Intra-op Plan:   Post-operative Plan:   Informed Consent: I have reviewed the patients History and Physical, chart, labs and discussed the procedure including the risks, benefits and alternatives for the proposed anesthesia with the patient or authorized representative who has indicated his/her understanding and acceptance.     Dental Advisory Given  Plan Discussed with: CRNA  Anesthesia Plan Comments:        Anesthesia Quick Evaluation

## 2024-03-07 NOTE — H&P (Signed)
 Primary Care Physician:  Cook, Jayce G, DO Primary Gastroenterologist:  Dr. Cinderella  Pre-Procedure History & Physical: HPI:  Dillon [name] is a 68 y.o. male is here for a colonoscopy for surveillance of  colon polyps.    No melena or hematochezia.  No abdominal pain or unintentional weight loss.  No change in bowel habits.  Overall feels well from a GI standpoint. Family history is positive for CRC in maternal grandmother who was in the 76s.   2020 - Three small polyps at the hepatic flexure and in the ascending colon. Biopsied. - Two 4 to 6 mm polyps in the proximal sigmoid colon and in the ascending colon, removed with a cold snare. Complete resection. Partial retrieval. - Diverticulosis in the sigmoid colon. - External hemorrhoids. - Anal papilla( e) were hypertrophied.  All TA's  Past Medical History:  Diagnosis Date   Arthritis    GERD (gastroesophageal reflux disease)    Hypertension    Lumbar spondylosis     Past Surgical History:  Procedure Laterality Date   COLONOSCOPY N/A 01/24/2019   Procedure: COLONOSCOPY;  Surgeon: Golda Claudis PENNER, MD;  Location: [facility];  Service: Endoscopy;  Laterality: N/A;  1030   HAND SURGERY Left    closed reduction left hand   JOINT REPLACEMENT Right    Ankle replacement   KNEE SURGERY Right    meniscetomy   POLYPECTOMY  01/24/2019   Procedure: POLYPECTOMY;  Surgeon: Golda Claudis PENNER, MD;  Location: [facility];  Service: Endoscopy;;  colon   TOTAL HIP ARTHROPLASTY Left 08/24/2021   Procedure: TOTAL HIP ARTHROPLASTY;  Surgeon: Margrette Taft FORBES, MD;  Location: [facility];  Service: Orthopedics;  Laterality: Left;   WRIST FRACTURE SURGERY      Prior to Admission medications   Medication Sig Start Date End Date Taking? Authorizing Provider  amLODipine  (NORVASC ) 5 MG tablet Take 1 tablet (5 mg total) by mouth daily. 01/03/24  Yes Cook, Jayce G, DO  Cholecalciferol  (VITAMIN D3) 50 MCG (2000 UT) TABS Take 2,000 Units by mouth in the  morning.   Yes [provider]  COD LIVER OIL PO Take 415 mg by mouth in the morning.   Yes [provider]  famotidine (PEPCID) 20 MG tablet Take 20 mg by mouth daily. 11/14/23  Yes [provider]  RABEprazole (ACIPHEX) 20 MG tablet Take 20 mg by mouth daily. 11/14/23  Yes [provider]    Allergies as of 01/29/2024   (No Known Allergies)    Family History  Problem Relation Age of Onset   Hypertension Mother    Diabetes Mother    Hypertension Father    Cancer Father     Social History   Socioeconomic History   Marital status: Married    Spouse name: Not on file   Number of children: Not on file   Years of education: Not on file   Highest education level: Not on file  Occupational History   Not on file  Tobacco Use   Smoking status: Never   Smokeless tobacco: Never  Vaping Use   Vaping status: Never Used  Substance and Sexual Activity   Alcohol use: Yes    Comment: rare   Drug use: No    Comment: hemp gummies   Sexual activity: Not on file  Other Topics Concern   Not on file  Social History Narrative   Not on file   Social Drivers of Health   Financial Resource Strain:  Not on file  Food Insecurity: Not on file  Transportation Needs: Not on file  Physical Activity: Not on file  Stress: Not on file  Social Connections: Not on file  Intimate Partner Violence: Not on file    Review of Systems: See HPI, otherwise negative ROS  Physical Exam: Vital signs in last 24 hours: Temp:  [98.3 F (36.8 C)] 98.3 F (36.8 C) (07/31 0906) Resp:  [14] 14 (07/31 0906) BP: (141)/(67) 141/67 (07/31 0906) SpO2:  [99 %] 99 % (07/31 0906)   General:   Alert,  Well-developed, well-nourished, pleasant and cooperative in NAD Head:  Normocephalic and atraumatic. Eyes:  Sclera clear, no icterus.   Conjunctiva pink. Ears:  Normal auditory acuity. Nose:  No deformity, discharge,  or lesions. Msk:  Symmetrical without gross deformities. Normal  posture. Extremities:  Without clubbing or edema. Neurologic:  Alert and  oriented x4;  grossly normal neurologically. Skin:  Intact without significant lesions or rashes. Psych:  Alert and cooperative. Normal mood and affect.  Impression/Plan:   Dillon [name] is a 68 y.o. male is here for a colonoscopy for surveillance of  colon polyps. Proceed with colonoscopy  The risks of the procedure including infection, bleed, or perforation as well as benefits, limitations, alternatives and imponderables have been reviewed with the patient. Questions have been answered. All parties agreeable.

## 2024-03-07 NOTE — Discharge Instructions (Signed)

## 2024-03-07 NOTE — Transfer of Care (Signed)
 Immediate Anesthesia Transfer of Care Note  Patient: Dillon [name]  Procedure(s) Performed: COLONOSCOPY  Patient Location: PACU  Anesthesia Type:MAC  Level of Consciousness: awake and alert   Airway & Oxygen Therapy: Patient Spontanous Breathing and Patient connected to nasal cannula oxygen  Post-op Assessment: Report given to RN and Post -op Vital signs reviewed and stable  Post vital signs: Reviewed and stable  Last Vitals:  Vitals Value Taken Time  BP 106/69 03/07/24 11:52  Temp    Pulse 79 03/07/24 11:52  Resp 18 03/07/24 11:52  SpO2 95 % 03/07/24 11:52    Last Pain:  Vitals:   03/07/24 1152  TempSrc: Oral  PainSc: 0-No pain         Complications: No notable events documented.

## 2024-03-07 NOTE — Op Note (Signed)
 [facility] Patient Name: Dillon [name] Procedure Date: 03/07/2024 10:52 AM MRN: [id number] Date of Birth: 06/22/1956 Attending MD: Deatrice Dine , MD, [id number] CSN: [id number] Age: 68 Admit Type: Outpatient Procedure:                Colonoscopy Indications:              High risk colon cancer surveillance: Personal                            history of colonic polyps Providers:                Deatrice Dine, MD, Crystal Page, Dorcas Lenis,                            Technician Referring MD:              Medicines:                Monitored Anesthesia Care Complications:            No immediate complications. Estimated Blood Loss:     Estimated blood loss was minimal. Procedure:                Pre-Anesthesia Assessment:                           - Prior to the procedure, a History and Physical                            was performed, and patient medications and                            allergies were reviewed. The patient's tolerance of                            previous anesthesia was also reviewed. The risks                            and benefits of the procedure and the sedation                            options and risks were discussed with the patient.                            All questions were answered, and informed consent                            was obtained. Prior Anticoagulants: The patient has                            taken no anticoagulant or antiplatelet agents. ASA                            Grade Assessment: II - A patient with mild systemic  disease. After reviewing the risks and benefits,                            the patient was deemed in satisfactory condition to                            undergo the procedure.                           After obtaining informed consent, the colonoscope                            was passed under direct vision. Throughout the                            procedure, the patient's blood  pressure, pulse, and                            oxygen saturations were monitored continuously. The                            [id number]) scope was introduced through the                            anus and advanced to the the cecum, identified by                            appendiceal orifice and ileocecal valve. The                            colonoscopy was performed without difficulty. The                            patient tolerated the procedure well. The quality                            of the bowel preparation was evaluated using the                            BBPS [facility] Bowel Preparation Scale) with scores                            of: Right Colon = 3, Transverse Colon = 3 and Left                            Colon = 3 (entire mucosa seen well with no residual                            staining, small fragments of stool or opaque                            liquid). The total BBPS score equals 9. The  ileocecal valve, appendiceal orifice, and rectum                            were photographed. Scope In: 11:13:07 AM Scope Out: 11:47:36 AM Scope Withdrawal Time: 0 hours 24 minutes 47 seconds  Total Procedure Duration: 0 hours 34 minutes 29 seconds  Findings:      Two sessile polyps were found in the transverse colon. The polyps were 4       to 6 mm in size. These polyps were removed with a cold snare. Resection       and retrieval were complete.      Multiple diverticula were found in the left colon.      Non-bleeding internal hemorrhoids were found during retroflexion. The       hemorrhoids were small. Impression:               - Two 4 to 6 mm polyps in the transverse colon,                            removed with a cold snare. Resected and retrieved.                           - Diverticulosis in the left colon.                           - Non-bleeding internal hemorrhoids. Moderate Sedation:      Per Anesthesia Care Recommendation:            - Patient has a contact number available for                            emergencies. The signs and symptoms of potential                            delayed complications were discussed with the                            patient. Return to normal activities tomorrow.                            Written discharge instructions were provided to the                            patient.                           - Resume previous diet.                           - Continue present medications.                           - Await pathology results.                           - Repeat colonoscopy in 5 years for surveillance  based on pathology results; family history of colon                            cancer.                           - Return to primary care physician as previously                            scheduled. Procedure Code(s):        --- Professional ---                           [phone], Colonoscopy, flexible; with removal of                            tumor(s), polyp(s), or other lesion(s) by snare                            technique Diagnosis Code(s):        --- Professional ---                           Z86.010, Personal history of colonic polyps                           D12.3, Benign neoplasm of transverse colon (hepatic                            flexure or splenic flexure)                           K64.8, Other hemorrhoids                           K57.30, Diverticulosis of large intestine without                            perforation or abscess without bleeding CPT copyright 2022 American Medical Association. All rights reserved. The codes documented in this report are preliminary and upon coder review may  be revised to meet current compliance requirements. Deatrice Dine, MD Deatrice Dine, MD 03/07/2024 11:51:54 AM This report has been signed electronically. Number of Addenda: 0

## 2024-03-08 LAB — SURGICAL PATHOLOGY

## 2024-03-08 NOTE — Anesthesia Postprocedure Evaluation (Signed)
 Anesthesia Post Note  Patient: Dillon [name]  Procedure(s) Performed: COLONOSCOPY  Patient location during evaluation: Phase II Anesthesia Type: General Level of consciousness: awake Pain management: pain level controlled Vital Signs Assessment: post-procedure vital signs reviewed and stable Respiratory status: spontaneous breathing and respiratory function stable Cardiovascular status: blood pressure returned to baseline and stable Postop Assessment: no headache and no apparent nausea or vomiting Anesthetic complications: no Comments: Late entry   No notable events documented.   Last Vitals:  Vitals:   03/07/24 0906 03/07/24 1152  BP: (!) 141/67 106/69  Pulse:  79  Resp: 14 18  Temp: 36.8 C   SpO2: 99% 95%    Last Pain:  Vitals:   03/07/24 1152  TempSrc: Oral  PainSc: 0-No pain                 Yvonna JINNY Bosworth

## 2024-03-11 ENCOUNTER — Ambulatory Visit (INDEPENDENT_AMBULATORY_CARE_PROVIDER_SITE_OTHER): Payer: Self-pay | Admitting: Gastroenterology

## 2024-03-12 ENCOUNTER — Ambulatory Visit: Payer: Self-pay

## 2024-03-12 ENCOUNTER — Encounter (HOSPITAL_COMMUNITY): Payer: Self-pay | Admitting: Gastroenterology

## 2024-03-12 ENCOUNTER — Ambulatory Visit: Admitting: Nurse Practitioner

## 2024-03-12 VITALS — BP 112/72 | HR 65 | Temp 97.2°F | Ht 72.0 in | Wt 233.0 lb

## 2024-03-12 DIAGNOSIS — J069 Acute upper respiratory infection, unspecified: Secondary | ICD-10-CM | POA: Diagnosis not present

## 2024-03-12 DIAGNOSIS — B9689 Other specified bacterial agents as the cause of diseases classified elsewhere: Secondary | ICD-10-CM | POA: Diagnosis not present

## 2024-03-12 MED ORDER — AMOXICILLIN-POT CLAVULANATE 875-125 MG PO TABS: 1.0000 | ORAL_TABLET | Freq: Two times a day (BID) | ORAL | 0 refills | Status: DC

## 2024-03-12 MED ORDER — PREDNISONE 20 MG PO TABS
ORAL_TABLET | ORAL | 0 refills | Status: AC
Start: 2024-03-12 — End: ?

## 2024-03-12 MED ORDER — PROMETHAZINE-DM 6.25-15 MG/5ML PO SYRP: 5.0000 mL | ORAL_SOLUTION | Freq: Four times a day (QID) | ORAL | 0 refills | Status: DC | PRN

## 2024-03-12 NOTE — Progress Notes (Signed)
 5 yr TCS noted in recall Patient result letter mailed Patient's PCP is on EPIC

## 2024-03-12 NOTE — Telephone Encounter (Signed)
 FYI Only or Action Required?: Action required by provider: request for appointment.  Patient was last seen in primary care on 01/03/2024 by Cook, Dillon G, DO.  Called Nurse Triage reporting Cough.  Symptoms began a week ago.  Interventions attempted: OTC medications: Coricidin.  Symptoms are: unchanged. Productive cough with yellow-green mucus.  Triage Disposition: See Physician Within 24 Hours  Patient/caregiver understands and will follow disposition?: Yes   Copied from CRM #[id number]. Topic: Clinical - Red Word Triage >> Mar 12, 2024  8:11 AM [name] wrote: Red Word that prompted transfer to Nurse Triage: The patient has had a productive cough since last Tuesday. Reason for Disposition  [1] Continuous (nonstop) coughing interferes with work or school AND [2] no improvement using cough treatment per Care Advice  Answer Assessment - Initial Assessment Questions 1. ONSET: When did the cough begin?      1 week 2. SEVERITY: How bad is the cough today?      severe 3. SPUTUM: Describe the color of your sputum (e.[name]., none, dry cough; clear, white, yellow, green)     Yellow-green 4. HEMOPTYSIS: Are you coughing up any blood? If Yes, ask: How much? (e.[name]., flecks, streaks, tablespoons, etc.)     no 5. DIFFICULTY BREATHING: Are you having difficulty breathing? If Yes, ask: How bad is it? (e.[name]., mild, moderate, severe)      no 6. FEVER: Do you have a fever? If Yes, ask: What is your temperature, how was it measured, and when did it start?     no 7. CARDIAC HISTORY: Do you have any history of heart disease? (e.[name]., heart attack, congestive heart failure)      no 8. LUNG HISTORY: Do you have any history of lung disease?  (e.[name]., pulmonary embolus, asthma, emphysema)     no 9. PE RISK FACTORS: Do you have a history of blood clots? (or: recent major surgery, recent prolonged travel, bedridden)     no 10. OTHER SYMPTOMS: Do you have any other symptoms? (e.[name]., runny nose,  wheezing, chest pain)       no 11. PREGNANCY: Is there any chance you are pregnant? When was your last menstrual period?       N/a 12. TRAVEL: Have you traveled out of the country in the last month? (e.[name]., travel history, exposures)       no  Protocols used: Cough - Acute Productive-A-AH

## 2024-03-12 NOTE — Progress Notes (Signed)
   Subjective:    Patient ID: Dillon [name], male    DOB: [birth year], 68 y.o.   MRN: [id number]  HPI Presents for complaints of cough and congestion that began a week ago.  Was prescribed a Z-Pak which seemed to help some but continues to have symptoms.  Feels some slight wheezing in his chest at nighttime.  No fever.  No ear pain or sore throat.  No shortness of breath or chest pain.  Producing green/yellow mucus at times.  Has been taking NyQuil at bedtime for sleep.  Coricidin during the day.  Denies any history of smoking or vaping.  States his GERD has been stable on Aciphex.  Denies any current symptoms.  Review of Systems See HPI.    Objective:   Physical Exam Vitals and nursing note reviewed.  Constitutional:      General: He is not in acute distress. HENT:     Ears:     Comments: TMs retracted bilaterally, no erythema.    Mouth/Throat:     Mouth: Mucous membranes are moist.     Pharynx: Oropharynx is clear.     Comments: Cloudy PND noted. Cardiovascular:     Rate and Rhythm: Normal rate and regular rhythm.  Pulmonary:     Effort: Pulmonary effort is normal. No respiratory distress.     Breath sounds: Normal breath sounds.  Musculoskeletal:     Cervical back: Neck supple.  Lymphadenopathy:     Cervical: No cervical adenopathy.  Neurological:     Mental Status: He is alert and oriented to person, place, and time.  Psychiatric:        Mood and Affect: Mood normal.        Behavior: Behavior normal.        Thought Content: Thought content normal.    Today's Vitals   03/12/24 1015  BP: 112/72  Pulse: 65  Temp: (!) 97.2 F (36.2 C)  SpO2: 98%  Weight: 233 lb (105.7 kg)  Height: 6' (1.829 m)   Body mass index is 31.6 kg/m.        Assessment & Plan:  Bacterial URI Meds ordered this encounter  Medications   amoxicillin -clavulanate (AUGMENTIN ) 875-125 MG tablet    Sig: Take 1 tablet by mouth 2 (two) times daily.    Dispense:  20 tablet    Refill:  0     Supervising Provider:   ALPHONSA HAMILTON A [[id number]]   predniSONE  (DELTASONE ) 20 MG tablet    Sig: Take 2 tabs po x 5 days    Dispense:  10 tablet    Refill:  0    Supervising Provider:   ALPHONSA HAMILTON A [[id number]]   promethazine -dextromethorphan (PROMETHAZINE -DM) 6.25-15 MG/5ML syrup    Sig: Take 5 mLs by mouth 4 (four) times daily as needed. For cough. Drowsiness precautions.    Dispense:  118 mL    Refill:  0    Supervising Provider:   ALPHONSA HAMILTON A [[id number]]   Start Augmentin  as directed.  Prednisone  taper.  Promethazine  DM for nighttime cough.  Drowsiness precautions.  Warning signs reviewed.  Call back early next week if no improvement, sooner if worse.

## 2024-03-22 ENCOUNTER — Telehealth: Payer: Self-pay

## 2024-03-22 ENCOUNTER — Other Ambulatory Visit: Payer: Self-pay

## 2024-03-22 MED ORDER — FAMOTIDINE 20 MG PO TABS: 20.0000 mg | ORAL_TABLET | Freq: Every day | ORAL | 1 refills | Status: DC

## 2024-03-22 MED ORDER — RABEPRAZOLE SODIUM 20 MG PO TBEC: 20.0000 mg | DELAYED_RELEASE_TABLET | Freq: Every day | ORAL | 1 refills | Status: DC

## 2024-03-22 NOTE — Telephone Encounter (Signed)
 Prescription Request  03/22/2024  LOV: Visit date not found  What is the name of the medication or equipment?   famotidine (PEPCID) 20 MG tablet    Have you contacted your pharmacy to request a refill? Yes   Which pharmacy would you like this sent to?  [facility] [place], [place] - [id number] [place] [address] Phone: [phone] Fax: [phone]    Patient notified that their request is being sent to the clinical staff for review and that they should receive a response within 2 business days.   Please advise at Mobile [phone] (mobile)

## 2024-03-22 NOTE — Telephone Encounter (Signed)
 Prescription Request  03/22/2024  LOV: Visit date not found  What is the name of the medication or equipment? RABEprazole  (ACIPHEX ) 20 MG tablet   Have you contacted your pharmacy to request a refill? Yes   Which pharmacy would you like this sent to?  [facility] [place], [place] - [id number] [place] [address] Phone: [phone] Fax: [phone]    Patient notified that their request is being sent to the clinical staff for review and that they should receive a response within 2 business days.   Please advise at Mobile [phone] (mobile)

## 2024-05-24 ENCOUNTER — Ambulatory Visit: Payer: Self-pay

## 2024-05-24 NOTE — Telephone Encounter (Signed)
   Message from Ravenna R sent at 05/24/2024 12:41 PM EDT  Summary: Rash   Reason for Triage: Patient states he has a rash on his butt. Didn't notice til his wife said something today, so doesn't know how long he has had it. No pain and doesn't itch.  Patient can be reached at [phone]

## 2024-05-24 NOTE — Telephone Encounter (Signed)
 FYI Only or Action Required?: FYI only for provider.  Patient was last seen in primary care on 03/12/2024 by [name], NP.  Called Nurse Triage reporting Rash.  Symptoms began today.  Interventions attempted: Nothing.  Symptoms are: unchanged.  Triage Disposition: See Physician Within 24 Hours  Patient/caregiver understands and will follow disposition?: Yes          Copied from CRM #[id number]. Topic: Clinical - [name] Triage >> May 24, 2024 12:40 PM [name] wrote: Dillon [name] triggered transfer to Nurse Triage. See Triage Message for details. >> May 24, 2024  1:26 PM Dillon [name] wrote: Patient states he was left a message to return to NT, concerning details in [name].  >> May 24, 2024 12:41 PM [name] wrote: Reason for Triage: Patient states he has a rash on his butt. Didn't notice til his wife said something today, so doesn't know how long he has had it. No pain and doesn't itch.   Patient can be reached at [phone] Reason for Disposition  [1] Localized rash is very painful AND [2] no fever    PCP with no access. Pt reports he will go to [facility].  Answer Assessment - Initial Assessment Questions 1. APPEARANCE of RASH: What does the rash look like? (e.g., blisters, dry flaky skin, red spots, redness, sores)      redness 2. LOCATION: Where is the rash located?      R side near coccyx 3. NUMBER: How many spots are there?      2 small spot 4. SIZE: How big are the spots? (e.g., inches, cm; or compare to size of pinhead, tip of pen, eraser, pea)      Size of finger 5. ONSET: When did the rash start?      Wife noticed today 6. ITCHING: Does the rash itch? If Yes, ask: How bad is the itch?  (Scale 0-10; or none, mild, moderate, severe)     denies 7. PAIN: Does the rash hurt? If Yes, ask: How bad is the pain?  (Scale 0-10; or none, mild, moderate, severe)     denies 8. OTHER SYMPTOMS: Do you have any other symptoms? (e.g., fever)      denies 9. PREGNANCY: Is there any chance you are pregnant? When was your last menstrual period?     N/a  Protocols used: Rash or Redness - Localized-A-AH

## 2024-06-06 ENCOUNTER — Other Ambulatory Visit: Payer: Self-pay | Admitting: Medical Genetics

## 2024-06-06 DIAGNOSIS — Z006 Encounter for examination for normal comparison and control in clinical research program: Secondary | ICD-10-CM

## 2024-06-11 ENCOUNTER — Telehealth: Payer: Self-pay | Admitting: Radiology

## 2024-06-11 NOTE — Telephone Encounter (Signed)
 I want to verify ABX have sent message to Dr VEAR

## 2024-06-11 NOTE — Telephone Encounter (Signed)
-----   Message from [place] sent at 06/11/2024  3:23 PM EST ----- Regarding: Tka Schedule tka dec 16 on a Tuesday, i have one tka that day already

## 2024-06-13 ENCOUNTER — Other Ambulatory Visit: Payer: Self-pay | Admitting: Orthopedic Surgery

## 2024-06-13 DIAGNOSIS — M1711 Unilateral primary osteoarthritis, right knee: Secondary | ICD-10-CM

## 2024-06-13 DIAGNOSIS — Z01818 Encounter for other preprocedural examination: Secondary | ICD-10-CM

## 2024-06-13 MED ORDER — BUPIVACAINE-MELOXICAM ER 400-12 MG/14ML IJ SOLN: 400.0000 mg | Freq: Once | INTRAMUSCULAR | Status: DC

## 2024-06-13 MED ORDER — VANCOMYCIN HCL 1 G IV SOLR: 1000.0000 mg | INTRAVENOUS | Status: AC

## 2024-06-13 NOTE — Telephone Encounter (Signed)
 I spoke to Dr Margrette  Patient needs appointment week before surgery for Right knee xrays and preop total knee replacement scheduled for 07/23/24

## 2024-06-13 NOTE — Addendum Note (Signed)
 Addended byBETHA JENEAN GREIG LELON on: 06/13/2024 10:54 AM   Modules accepted: Orders

## 2024-06-25 ENCOUNTER — Other Ambulatory Visit: Payer: Self-pay | Admitting: Orthopedic Surgery

## 2024-06-25 DIAGNOSIS — M1711 Unilateral primary osteoarthritis, right knee: Secondary | ICD-10-CM

## 2024-06-29 ENCOUNTER — Encounter: Payer: Self-pay | Admitting: Orthopedic Surgery

## 2024-06-29 MED ORDER — FLUCONAZOLE 150 MG PO TABS: 150.0000 mg | ORAL_TABLET | Freq: Every day | ORAL | 0 refills | Status: AC

## 2024-07-12 ENCOUNTER — Ambulatory Visit: Admitting: Orthopedic Surgery

## 2024-07-12 ENCOUNTER — Encounter: Payer: Self-pay | Admitting: Orthopedic Surgery

## 2024-07-12 ENCOUNTER — Other Ambulatory Visit (INDEPENDENT_AMBULATORY_CARE_PROVIDER_SITE_OTHER): Payer: Self-pay

## 2024-07-12 ENCOUNTER — Other Ambulatory Visit: Payer: Self-pay

## 2024-07-12 VITALS — Ht 72.0 in | Wt 233.0 lb

## 2024-07-12 DIAGNOSIS — M1711 Unilateral primary osteoarthritis, right knee: Secondary | ICD-10-CM

## 2024-07-12 NOTE — Progress Notes (Signed)
   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  He has had frequent effusions and pain in his right knee treated with aspirations and injections.  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  No questions   HANDOUTS given  Plain films were obtained including long-leg film.  He has a 16 degree valgus deformity of the right knee he has a right total ankle arthroplasty a left total hip arthroplasty has severe wear of his lateral compartment grade 4 with some osteophytes and sclerosis   Encounter Diagnosis  Name Primary?   Primary osteoarthritis of right knee Yes   Plan is for right total knee arthroplasty

## 2024-07-12 NOTE — Progress Notes (Signed)
    07/12/2024   Chief Complaint  Patient presents with   Knee Pain    Discuss surgery hasn't had xrays posted for total knee replacement right on 07/23/24 has appointment on Monday came in today so he was worked in     Encounter Diagnosis  Name Primary?   Primary osteoarthritis of right knee Yes    What pharmacy do you use ? ____[facility]_______________________  DOI/DOS/   Did you get better, worse or no change (Answer below)   Worse    Knee Pain Discuss surgery hasn't had xrays posted for total knee replacement right on 07/23/24 has appointment on Monday came in today so he was worked in

## 2024-07-12 NOTE — Patient Instructions (Signed)
 Your surgery will be at [facility] on Dec 16th  by Dr Margrette  plan to be in hospital overnight. The hospital will contact you with a preoperative appointment to discuss Anesthesia.  Please arrive on time or 15 minutes early for the preoperative appointment, they have a very tight schedule if you are late or do not come in your surgery will be cancelled.  The phone number for the preop area is [phone]. Please bring your medications with you for the appointment. They will tell you the arrival time for surgery and medication instructions when you have your preoperative evaluation. Do not wear nail polish the day of your surgery and if you take Phentermine you need to stop this medication ONE WEEK prior to your surgery. f you take Invokana, Farxiga, Jardiance, or Steglatro) - Hold 72 hours before the procedure.  If you take Ozempic,  Bydureo, Mounjaro, Mabscott or Trulicity do not take for 8 days before your surgery. If you take Victoza, Rybelsis, Saxenda or Adlyxi stop 24 hours before the procedure. Please arrive at the hospital 2 hours before procedure if scheduled at 9:30 or later in the day or at the time the nurse tells you at your preoperative visit.   If you have my chart do not use the time given in my chart use the time given to you by the nurse during your preoperative visit.   Your surgery  time may change. Please be available for phone calls the day of your surgery and the day before. The Short Stay department may need to discuss changes about your surgery time. Not reaching the you could lead to procedure delays and possible cancellation.  You must have a ride home and someone to stay with you for 24 to 48 hours. The person taking you home will receive and sign for the your discharge instructions.  Please be prepared to give your support person's name and telephone number to [employer]. Dr Margrette will need that name and phone number post procedure.   You will also get a call  from a representative of [employer], they have a machine that you will use in the first few weeks after surgery. It is called a CPM.   You will have home physical therapy for 2 weeks after surgery, the home health agency will call you before or just following the surgery to set up visits. [facility] is the agency we normally use, unless you request another agency.   You will get a call also from outpatient therapy for therapy starting when the home therapy is done.  If you have questions or need to Reschedule the surgery, call the office ask for Shaila Gilchrest.

## 2024-07-15 ENCOUNTER — Ambulatory Visit: Admitting: Orthopedic Surgery

## 2024-07-16 NOTE — Patient Instructions (Addendum)
 Dillon [name]  07/16/2024     @PREFPERIOPPHARMACY @    Your procedure is scheduled on 07/23/2024.   [facility] to [facility] at 8:20 A.M.   Call this number if you have problems the morning of surgery:  [phone]  If you experience any cold or flu symptoms such as cough, fever, chills, shortness of breath, etc. between now and your scheduled surgery, please notify us  at the above number.   Remember:   Do not eat or drink after midnight.     Take these medicines the morning of surgery with A SIP OF WATER  : Norvasc  Pepcid  Aciphex     Do not wear jewelry, make-up or nail polish, including gel polish,  artificial nails, or any other type of covering on natural nails (fingers and  toes).  Do not wear lotions, powders, or perfumes, or deodorant.  Do not shave 48 hours prior to surgery.  Men may shave face and neck.  Do not bring valuables to the hospital.  [facility] is not responsible for any belongings or valuables.  Contacts, dentures or bridgework may not be worn into surgery.  Leave your suitcase in the car.  After surgery it may be brought to your room.  For patients admitted to the hospital, discharge time will be determined by your treatment team.  Patients discharged the day of surgery will not be allowed to drive home.   Name and phone number of your driver:   Family  Special instructions:  N/A  Please read over the following fact sheets that you were given.  Care and Recovery After Surgery   Total Knee Replacement Surgery: What to Expect  Total knee replacement is a surgery to replace a damaged knee joint with a new joint that's made of plastic, metal, or ceramic parts. The new joint replaces parts of the thigh bone, lower leg bone, and kneecap. The new joint is called a prosthetic joint or prosthesis. The goal of surgery is to reduce knee pain and help your knee move better. Tell a health care provider about: Any allergies you have. All medicines you take.  These include vitamins, herbs, eye drops, and creams. Any problems you or family members have had with anesthesia. Any bleeding problems you have. Any surgeries you've had. Any medical problems you have. Whether you're pregnant or may be pregnant. What are the risks? Your health care provider will talk with you about risks. These may include: Infection. Bleeding. Blood clots. Allergies to medicines. Damage to nerves and other parts of the knee. Problems with the knee, such as: Not being able to move your knee. A feeling that the knee is weak or unstable. Loosening of the new joint. Pain that doesn't go away. What happens before? When to stop eating and drinking Eat and drink only as you've been told. You may be told this: 8 hours before your surgery Stop eating most foods. Do not eat meat, fried foods, or fatty foods. Eat only light foods, such as toast or crackers. All liquids are OK except energy drinks and alcohol. 6 hours before your surgery Stop eating. Drink only clear liquids, such as water , clear fruit juice, black coffee, plain tea, and sports drinks. Do not drink energy drinks or alcohol. 2 hours before your procedure Stop drinking all liquids. You may be allowed to take medicines with small sips of water . If you do not eat and drink as told, your surgery may be delayed or canceled. Medicines Ask about changing or stopping:  Any medicines you take. Any vitamins, herbs, or supplements you take. Do not take aspirin  or ibuprofen unless you're told to. Tests and exams You may have a physical exam. You may have tests, such as: X-rays. An MRI. A CT scan. A bone scan. A blood or pee test. Lifestyle Keep your body and teeth clean. Germs from anywhere in your body can infect your new joint. Talk with your provider about: Dental care and cleanings. Skin care. Exercise as told. If you're overweight, work with your provider to lose weight. Extra weight can affect your  knee. Do not smoke, vape, or use nicotine or tobacco. Surgery safety For your safety, you may: Need to wash your skin with a soap that kills germs. Get antibiotics. Have your surgery site marked. Have hair removed at the surgery site. General instructions Do not shave your legs just before surgery. This will be done in the hospital if needed. If you'll be going home right after the surgery, plan to have a responsible adult: Drive you home from the hospital or clinic. You won't be allowed to drive. Stay with you for the time you're told. It's best to have someone care for you for at least 4-6 weeks when you get home. What happens during a total knee replacement surgery? An IV will be put into a vein in your hand or arm. You may be given: A sedative to help you relax. Anesthesia to keep you from feeling pain. A cut will be made in your knee. Damaged parts of your knee will be taken out. Parts of the new joint will be placed over the parts that were taken out. Your cut from surgery will be closed with stitches, staples, skin glue, or tape strips. A bandage will be placed over your cut from surgery. These steps may vary. Ask what you can expect. What happens after? You'll be watched closely until you leave. This includes checking your pain level, blood pressure, heart rate, and breathing rate. You'll be given medicines for pain. You may keep getting fluids through your IV. You'll be told to move around as much as you can. You'll be taught how to use crutches or a walker and how to exercise at home. You may be told to wear socks called compression stockings to reduce swelling and help prevent blood clots in your legs. This information is not intended to replace advice given to you by your health care provider. Make sure you discuss any questions you have with your health care provider. Document Revised: 04/27/2023 Document Reviewed: 12/29/2022 Elsevier Patient Education  2024 [employer]  General Anesthesia, Adult General anesthesia is the use of medicine to make you fall asleep (unconscious) for a medical procedure. General anesthesia must be used for certain procedures. It is often recommended for surgery or procedures that: Last a long time. Require you to be still or in an unusual position. Are major and can cause blood loss. Affect your breathing. The medicines used for general anesthesia are called general anesthetics. During general anesthesia, these medicines are given along with medicines that: Prevent pain. Control your blood pressure. Relax your muscles. Prevent nausea and vomiting after the procedure. Tell a health care provider about: Any allergies you have. All medicines you are taking, including vitamins, herbs, eye drops, creams, and over-the-counter medicines. Your history of any: Medical conditions you have, including: High blood pressure. Bleeding problems. Diabetes. Heart or lung conditions, such as: Heart failure. Sleep apnea. Asthma. Chronic obstructive pulmonary disease (COPD). Current  or recent illnesses, such as: Upper respiratory, chest, or ear infections. Cough or fever. Tobacco or drug use, including marijuana or alcohol use. Depression or anxiety. Surgeries and types of anesthetics you have had. Problems you or family members have had with anesthetic medicines. Whether you are pregnant or may be pregnant. Whether you have any chipped or loose teeth, dentures, caps, bridgework, or issues with your mouth, swallowing, or choking. What are the risks? Your health care provider will talk with you about risks. These may include: Allergic reaction to the medicines. Lung and heart problems. Inhaling food or liquid from the stomach into the lungs (aspiration). Nerve injury. Injury to the lips, mouth, teeth, or gums. Stroke. Waking up during your procedure and being unable to move. This is rare. These problems are more likely to  develop if you are having a major surgery or if you have an advanced or serious medical condition. You can prevent some of these complications by answering all of your health care provider's questions thoroughly and by following all instructions before your procedure. General anesthesia can cause side effects, including: Nausea or vomiting. A sore throat or hoarseness from the breathing tube. Wheezing or coughing. Shaking chills or feeling cold. Body aches. Sleepiness. Confusion, agitation (delirium), or anxiety. What happens before the procedure? When to stop eating and drinking Follow instructions from your health care provider about what you may eat and drink before your procedure. If you do not follow your health care provider's instructions, your procedure may be delayed or canceled. Medicines Ask your health care provider about: Changing or stopping your regular medicines. These include any diabetes medicines or blood thinners you take. Taking medicines such as aspirin  and ibuprofen. These medicines can thin your blood. Do not take them unless your health care provider tells you to. Taking over-the-counter medicines, vitamins, herbs, and supplements. General instructions Do not use any products that contain nicotine or tobacco for at least 4 weeks before the procedure. These products include cigarettes, chewing tobacco, and vaping devices, such as e-cigarettes. If you need help quitting, ask your health care provider. If you brush your teeth on the morning of the procedure, make sure to spit out all of the water  and toothpaste. If told by your health care provider, bring your sleep apnea device with you to surgery (if applicable). If you will be going home right after the procedure, plan to have a responsible adult: Take you home from the hospital or clinic. You will not be allowed to drive. Care for you for the time you are told. What happens during the procedure?  An IV will be  inserted into one of your veins. You will be given one or more of the following through a face mask or IV: A sedative. This helps you relax. Anesthesia. This will: Numb certain areas of your body. Make you fall asleep for surgery. After you are unconscious, a breathing tube may be inserted down your throat to help you breathe. This will be removed before you wake up. An anesthesia provider, such as an anesthesiologist, will stay with you throughout your procedure. The anesthesia provider will: Keep you comfortable and safe by continuing to give you medicines and adjusting the amount of medicine that you get. Monitor your blood pressure, heart rate, and oxygen levels to make sure that the anesthetics do not cause any problems. The procedure may vary among health care providers and hospitals. What happens after the procedure? Your blood pressure, temperature, heart rate, breathing rate, and  blood oxygen level will be monitored until you leave the hospital or clinic. You will wake up in a recovery area. You may wake up slowly. You may be given medicine to help you with pain, nausea, or any other side effects from the anesthesia. Summary General anesthesia is the use of medicine to make you fall asleep (unconscious) for a medical procedure. Follow your health care provider's instructions about when to stop eating, drinking, or taking certain medicines before your procedure. Plan to have a responsible adult take you home from the hospital or clinic. This information is not intended to replace advice given to you by your health care provider. Make sure you discuss any questions you have with your health care provider. Document Revised: 10/21/2021 Document Reviewed: 10/21/2021 Elsevier Patient Education  2024 [employer].  How to Use an Incentive Spirometer An incentive spirometer is a tool that measures how well you are filling your lungs with each breath. Learning to take long, deep breaths  using this tool can help you keep your lungs clear and active. This may help to reverse or lessen your chance of developing breathing (pulmonary) problems, especially infection. You may be asked to use a spirometer: After a surgery. If you have a lung problem or a history of smoking. After a long period of time when you have been unable to move or be active. If the spirometer includes an indicator to show the highest number that you have reached, your health care provider or respiratory therapist will help you set a goal. Keep a log of your progress as told by your health care provider. What are the risks? Breathing too quickly may cause dizziness or cause you to pass out. Take your time so you do not get dizzy or light-headed. If you are in pain, you may need to take pain medicine before doing incentive spirometry. It is harder to take a deep breath if you are having pain. How to use your incentive spirometer  Sit up on the edge of your bed or on a chair. Hold the incentive spirometer so that it is in an upright position. Before you use the spirometer, breathe out normally. Place the mouthpiece in your mouth. Make sure your lips are closed tightly around it. Breathe in slowly and as deeply as you can through your mouth, causing the piston or the ball to rise toward the top of the chamber. Hold your breath for 3-5 seconds, or for as long as possible. If the spirometer includes a coach indicator, use this to guide you in breathing. Slow down your breathing if the indicator goes above the marked areas. Remove the mouthpiece from your mouth and breathe out normally. The piston or ball will return to the bottom of the chamber. Rest for a few seconds, then repeat the steps 10 or more times. Take your time and take a few normal breaths between deep breaths so that you do not get dizzy or light-headed. Do this every 1-2 hours when you are awake. If the spirometer includes a goal marker to show the  highest number you have reached (best effort), use this as a goal to work toward during each repetition. After each set of 10 deep breaths, cough a few times. This will help to make sure that your lungs are clear. If you have an incision on your chest or abdomen from surgery, place a pillow or a rolled-up towel firmly against the incision when you cough. This can help to reduce pain while  taking deep breaths and coughing. General tips When you are able to get out of bed: Walk around often. Continue to take deep breaths and cough in order to clear your lungs. Keep using the incentive spirometer until your health care provider says it is okay to stop using it. If you have been in the hospital, you may be told to keep using the spirometer at home. Contact a health care provider if: You are having difficulty using the spirometer. You have trouble using the spirometer as often as instructed. Your pain medicine is not giving enough relief for you to use the spirometer as told. You have a fever. Get help right away if: You develop shortness of breath. You develop a cough with bloody mucus from the lungs. You have fluid or blood coming from an incision site after you cough. Summary An incentive spirometer is a tool that can help you learn to take long, deep breaths to keep your lungs clear and active. You may be asked to use a spirometer after a surgery, if you have a lung problem or a history of smoking, or if you have been inactive for a long period of time. Use your incentive spirometer as instructed every 1-2 hours while you are awake. If you have an incision on your chest or abdomen, place a pillow or a rolled-up towel firmly against your incision when you cough. This will help to reduce pain. Get help right away if you have shortness of breath, you cough up bloody mucus, or blood comes from your incision when you cough. This information is not intended to replace advice given to you by your  health care provider. Make sure you discuss any questions you have with your health care provider. Document Revised: 06/02/2023 Document Reviewed: 06/02/2023 Elsevier Patient Education  2024 [employer].  How to Use an Incentive Spirometer An incentive spirometer is a tool that measures how well you are filling your lungs with each breath. Learning to take long, deep breaths using this tool can help you keep your lungs clear and active. This may help to reverse or lessen your chance of developing breathing (pulmonary) problems, especially infection. You may be asked to use a spirometer: After a surgery. If you have a lung problem or a history of smoking. After a long period of time when you have been unable to move or be active. If the spirometer includes an indicator to show the highest number that you have reached, your health care provider or respiratory therapist will help you set a goal. Keep a log of your progress as told by your health care provider. What are the risks? Breathing too quickly may cause dizziness or cause you to pass out. Take your time so you do not get dizzy or light-headed. If you are in pain, you may need to take pain medicine before doing incentive spirometry. It is harder to take a deep breath if you are having pain. How to use your incentive spirometer  Sit up on the edge of your bed or on a chair. Hold the incentive spirometer so that it is in an upright position. Before you use the spirometer, breathe out normally. Place the mouthpiece in your mouth. Make sure your lips are closed tightly around it. Breathe in slowly and as deeply as you can through your mouth, causing the piston or the ball to rise toward the top of the chamber. Hold your breath for 3-5 seconds, or for as long as possible. If the  spirometer includes a coach indicator, use this to guide you in breathing. Slow down your breathing if the indicator goes above the marked areas. Remove the mouthpiece  from your mouth and breathe out normally. The piston or ball will return to the bottom of the chamber. Rest for a few seconds, then repeat the steps 10 or more times. Take your time and take a few normal breaths between deep breaths so that you do not get dizzy or light-headed. Do this every 1-2 hours when you are awake. If the spirometer includes a goal marker to show the highest number you have reached (best effort), use this as a goal to work toward during each repetition. After each set of 10 deep breaths, cough a few times. This will help to make sure that your lungs are clear. If you have an incision on your chest or abdomen from surgery, place a pillow or a rolled-up towel firmly against the incision when you cough. This can help to reduce pain while taking deep breaths and coughing. General tips When you are able to get out of bed: Walk around often. Continue to take deep breaths and cough in order to clear your lungs. Keep using the incentive spirometer until your health care provider says it is okay to stop using it. If you have been in the hospital, you may be told to keep using the spirometer at home. Contact a health care provider if: You are having difficulty using the spirometer. You have trouble using the spirometer as often as instructed. Your pain medicine is not giving enough relief for you to use the spirometer as told. You have a fever. Get help right away if: You develop shortness of breath. You develop a cough with bloody mucus from the lungs. You have fluid or blood coming from an incision site after you cough. Summary An incentive spirometer is a tool that can help you learn to take long, deep breaths to keep your lungs clear and active. You may be asked to use a spirometer after a surgery, if you have a lung problem or a history of smoking, or if you have been inactive for a long period of time. Use your incentive spirometer as instructed every 1-2 hours while you  are awake. If you have an incision on your chest or abdomen, place a pillow or a rolled-up towel firmly against your incision when you cough. This will help to reduce pain. Get help right away if you have shortness of breath, you cough up bloody mucus, or blood comes from your incision when you cough. This information is not intended to replace advice given to you by your health care provider. Make sure you discuss any questions you have with your health care provider. Document Revised: 06/02/2023 Document Reviewed: 06/02/2023 Elsevier Patient Education  2024 [employer].

## 2024-07-17 ENCOUNTER — Inpatient Hospital Stay (HOSPITAL_COMMUNITY): Admission: RE | Admit: 2024-07-17 | Discharge: 2024-07-17 | Attending: Orthopedic Surgery

## 2024-07-17 ENCOUNTER — Telehealth: Payer: Self-pay | Admitting: Radiology

## 2024-07-17 ENCOUNTER — Encounter (HOSPITAL_COMMUNITY): Payer: Self-pay

## 2024-07-17 VITALS — BP 137/90 | HR 79 | Temp 97.8°F | Resp 18

## 2024-07-17 DIAGNOSIS — I491 Atrial premature depolarization: Secondary | ICD-10-CM | POA: Insufficient documentation

## 2024-07-17 DIAGNOSIS — Z01818 Encounter for other preprocedural examination: Secondary | ICD-10-CM | POA: Insufficient documentation

## 2024-07-17 DIAGNOSIS — I493 Ventricular premature depolarization: Secondary | ICD-10-CM | POA: Insufficient documentation

## 2024-07-17 DIAGNOSIS — M1711 Unilateral primary osteoarthritis, right knee: Secondary | ICD-10-CM | POA: Insufficient documentation

## 2024-07-17 LAB — CBC WITH DIFFERENTIAL/PLATELET
Abs Immature Granulocytes: 0.02 K/uL (ref 0.00–0.07)
Basophils Absolute: 0 K/uL (ref 0.0–0.1)
Basophils Relative: 1 %
Eosinophils Absolute: 0.1 K/uL (ref 0.0–0.5)
Eosinophils Relative: 2 %
HCT: 44.5 % (ref 39.0–52.0)
Hemoglobin: 14.8 g/dL (ref 13.0–17.0)
Immature Granulocytes: 0 %
Lymphocytes Relative: 26 %
Lymphs Abs: 1.8 K/uL (ref 0.7–4.0)
MCH: 29.5 pg (ref 26.0–34.0)
MCHC: 33.3 g/dL (ref 30.0–36.0)
MCV: 88.8 fL (ref 80.0–100.0)
Monocytes Absolute: 0.5 K/uL (ref 0.1–1.0)
Monocytes Relative: 7 %
Neutro Abs: 4.4 K/uL (ref 1.7–7.7)
Neutrophils Relative %: 64 %
Platelets: 216 K/uL (ref 150–400)
RBC: 5.01 MIL/uL (ref 4.22–5.81)
RDW: 12.9 % (ref 11.5–15.5)
WBC: 6.8 K/uL (ref 4.0–10.5)
nRBC: 0 % (ref 0.0–0.2)

## 2024-07-17 LAB — BASIC METABOLIC PANEL WITH GFR
Anion gap: 14 (ref 5–15)
BUN: 16 mg/dL (ref 8–23)
CO2: 22 mmol/L (ref 22–32)
Calcium: 9.3 mg/dL (ref 8.9–10.3)
Chloride: 104 mmol/L (ref 98–111)
Creatinine, Ser: 0.77 mg/dL (ref 0.61–1.24)
GFR, Estimated: >60 mL/min (ref >60)
Glucose, Bld: 81 mg/dL (ref 70–99)
Potassium: 4.4 mmol/L (ref 3.5–5.1)
Sodium: 140 mmol/L (ref 135–145)

## 2024-07-17 LAB — PREPARE RBC (CROSSMATCH)

## 2024-07-17 LAB — SURGICAL PCR SCREEN
MRSA, PCR: NEGATIVE
Staphylococcus aureus: NEGATIVE

## 2024-07-17 NOTE — Addendum Note (Signed)
 Addended byBETHA JENEAN GREIG LELON on: 07/17/2024 02:55 PM   Modules accepted: Orders

## 2024-07-17 NOTE — Telephone Encounter (Signed)
 error

## 2024-07-18 ENCOUNTER — Other Ambulatory Visit: Payer: Self-pay

## 2024-07-18 ENCOUNTER — Other Ambulatory Visit: Payer: Self-pay | Admitting: Orthopedic Surgery

## 2024-07-18 DIAGNOSIS — R058 Other specified cough: Secondary | ICD-10-CM

## 2024-07-22 NOTE — H&P (Signed)
 TOTAL KNEE ADMISSION H&P  Patient is being admitted for right total knee arthroplasty.  Subjective:  Chief Complaint:right knee pain.  HPI: Dillon [name], 68 y.o. male, has a history of pain and functional disability in the right knee due to arthritis and has failed non-surgical conservative treatments for greater than 12 weeks to includeNSAID's and/or analgesics, corticosteriod injections, weight reduction as appropriate, and activity modification.  Onset of symptoms was gradual, starting 5 years ago with gradually worsening course since that time. The patient noted no past surgery on the right knee(s).  Patient currently rates pain in the right knee(s) at 8 out of 10 with activity. Patient has worsening of pain with activity and weight bearing, pain that interferes with activities of daily living, pain with passive range of motion, and crepitus.  Patient has evidence of subchondral cysts, subchondral sclerosis, periarticular osteophytes, and joint space narrowing by imaging studies. This patient has had . There is no active infection.  Patient Active Problem List   Diagnosis Date Noted   Adenomatous polyp of transverse colon 03/07/2024   Essential hypertension 01/03/2024   GERD (gastroesophageal reflux disease) 01/03/2024   Hyperlipidemia 12/20/2022   Annual physical exam 12/16/2021   S/P hip replacement, left 08/24/21 09/02/2021   Past Medical History:  Diagnosis Date   Arthritis    GERD (gastroesophageal reflux disease)    Hypertension    Lumbar spondylosis     Past Surgical History:  Procedure Laterality Date   COLONOSCOPY N/A 01/24/2019   Procedure: COLONOSCOPY;  Surgeon: Golda Claudis PENNER, MD;  Location: [facility];  Service: Endoscopy;  Laterality: N/A;  1030   COLONOSCOPY N/A 03/07/2024   Procedure: COLONOSCOPY;  Surgeon: Cinderella Deatrice FALCON, MD;  Location: [facility];  Service: Endoscopy;  Laterality: N/A;  10:00 am, asa 1/2   HAND SURGERY Left    closed reduction left  hand   JOINT REPLACEMENT Right    Ankle replacement   KNEE SURGERY Right    meniscetomy   POLYPECTOMY  01/24/2019   Procedure: POLYPECTOMY;  Surgeon: Golda Claudis PENNER, MD;  Location: [facility];  Service: Endoscopy;;  colon   TOTAL HIP ARTHROPLASTY Left 08/24/2021   Procedure: TOTAL HIP ARTHROPLASTY;  Surgeon: Margrette Taft FORBES, MD;  Location: [facility];  Service: Orthopedics;  Laterality: Left;   WRIST FRACTURE SURGERY      Current Facility-Administered Medications  Medication Dose Route Frequency Provider Last Rate Last Admin   bupivacaine -meloxicam  ER (ZYNRELEF ) injection 400 mg  400 mg Infiltration Once Jilleen Essner E, MD       bupivacaine -meloxicam  ER (ZYNRELEF ) injection 400 mg  400 mg Infiltration Once Hend Mccarrell E, MD       Current Outpatient Medications  Medication Sig Dispense Refill Last Dose/Taking   amLODipine  (NORVASC ) 5 MG tablet Take 1 tablet (5 mg total) by mouth daily. 90 tablet 3 Taking   azithromycin (ZITHROMAX) 250 MG tablet Take 250 mg by mouth daily. 500mg  on day 1, and 250mg  days 2-4   Taking   Cholecalciferol  (VITAMIN D3) 50 MCG (2000 UT) TABS Take 2,000 Units by mouth in the morning.   Taking   famotidine  (PEPCID ) 20 MG tablet Take 1 tablet (20 mg total) by mouth daily. 90 tablet 1 Taking   Magnesium  250 MG TABS Take 1 tablet by mouth daily.   Taking   OVER THE COUNTER MEDICATION Take 3 each by mouth at bedtime. Hemp gummies   Taking   Pseudoeph-Doxylamine-DM-APAP (NYQUIL PO) Take 10 mLs by  mouth at bedtime as needed.   Taking As Needed   RABEprazole  (ACIPHEX ) 20 MG tablet Take 1 tablet (20 mg total) by mouth daily. 90 tablet 1 Taking   predniSONE  (DELTASONE ) 20 MG tablet Take 2 tabs po x 5 days (Patient not taking: Reported on 07/12/2024) 10 tablet 0 Not Taking   Allergies[1]  Social History   Tobacco Use   Smoking status: Never   Smokeless tobacco: Never  Substance Use Topics   Alcohol use: Yes    Comment: rare    Family History  Problem  Relation Age of Onset   Hypertension Mother    Diabetes Mother    Hypertension Father    Cancer Father      Review of Systems  All other systems reviewed and are negative.   Objective:  Physical Exam Vitals and nursing note reviewed.  Constitutional:      General: He is not in acute distress.    Appearance: Normal appearance. He is normal weight. He is not ill-appearing, toxic-appearing or diaphoretic.  HENT:     Head: Normocephalic and atraumatic.     Right Ear: External ear normal.     Left Ear: External ear normal.     Nose: Nose normal. No congestion or rhinorrhea.     Mouth/Throat:     Pharynx: Oropharynx is clear. No oropharyngeal exudate.  Eyes:     General: No scleral icterus.       Right eye: No discharge.        Left eye: No discharge.     Extraocular Movements: Extraocular movements intact.     Conjunctiva/sclera: Conjunctivae normal.     Pupils: Pupils are equal, round, and reactive to light.  Cardiovascular:     Rate and Rhythm: Normal rate.     Pulses: Normal pulses.  Pulmonary:     Effort: Pulmonary effort is normal.     Breath sounds: No stridor. No wheezing or rhonchi.  Abdominal:     General: Abdomen is flat. There is no distension.  Musculoskeletal:     Cervical back: Neck supple.  Skin:    General: Skin is warm and dry.     Capillary Refill: Capillary refill takes less than 2 seconds.  Neurological:     General: No focal deficit present.     Mental Status: He is alert and oriented to person, place, and time. Mental status is at baseline.     Cranial Nerves: No cranial nerve deficit.     Sensory: No sensory deficit.     Motor: No weakness.     Coordination: Coordination normal.     Gait: Gait normal.     Deep Tendon Reflexes: Reflexes normal.  Psychiatric:        Mood and Affect: Mood normal.        Behavior: Behavior normal.        Thought Content: Thought content normal.        Judgment: Judgment normal.    Patient has a right total  ankle arthroplasty which gives his foot sort of a planovalgus appearance  He has a severe valgus alignment to the right knee.  However his hip range of motion seems to be normal he has tenderness in the lateral compartment his extensor mechanism is intact he has a small joint effusion is neurovascular exam seems normal and his overall range of motion without anesthesia seems to be in the 120 degree range I will remeasure prior to surgery there is no flexion contracture  Vital signs in last 24 hours:    Labs:   Estimated body mass index is 31.6 kg/m as calculated from the following:   Height as of 07/12/24: 6' (1.829 m).   Weight as of 07/12/24: 105.7 kg.   Imaging Review Plain radiographs demonstrate severe degenerative joint disease of the right knee(s). The overall alignment issignificant valgus. The bone quality appears to be good for age and reported activity level.      Assessment/Plan:  End stage arthritis, right knee   The patient history, physical examination, clinical judgment of the provider and imaging studies are consistent with end stage degenerative joint disease of the right knee(s) and total knee arthroplasty is deemed medically necessary. The treatment options including medical management, injection therapy arthroscopy and arthroplasty were discussed at length. The risks and benefits of total knee arthroplasty were presented and reviewed. The risks due to aseptic loosening, infection, stiffness, patella tracking problems, thromboembolic complications and other imponderables were discussed. The patient acknowledged the explanation, agreed to proceed with the plan and consent was signed. Patient is being admitted for inpatient treatment for surgery, pain control, PT, OT, prophylactic antibiotics, VTE prophylaxis, progressive ambulation and ADL's and discharge planning. The patient is planning to be discharged home with home health services         [1] No Known  Allergies

## 2024-07-23 ENCOUNTER — Ambulatory Visit (HOSPITAL_COMMUNITY): Admitting: Anesthesiology

## 2024-07-23 ENCOUNTER — Observation Stay (HOSPITAL_COMMUNITY)
Admission: RE | Admit: 2024-07-23 | Discharge: 2024-07-24 | Disposition: A | Attending: Orthopedic Surgery | Admitting: Orthopedic Surgery

## 2024-07-23 ENCOUNTER — Ambulatory Visit (HOSPITAL_COMMUNITY)

## 2024-07-23 ENCOUNTER — Encounter: Admission: RE | Disposition: A | Payer: Self-pay | Attending: Orthopedic Surgery

## 2024-07-23 ENCOUNTER — Other Ambulatory Visit: Payer: Self-pay

## 2024-07-23 ENCOUNTER — Encounter (HOSPITAL_COMMUNITY): Payer: Self-pay | Admitting: Orthopedic Surgery

## 2024-07-23 DIAGNOSIS — Z79899 Other long term (current) drug therapy: Secondary | ICD-10-CM | POA: Diagnosis not present

## 2024-07-23 DIAGNOSIS — I10 Essential (primary) hypertension: Secondary | ICD-10-CM | POA: Diagnosis not present

## 2024-07-23 DIAGNOSIS — M1711 Unilateral primary osteoarthritis, right knee: Principal | ICD-10-CM | POA: Diagnosis present

## 2024-07-23 DIAGNOSIS — Z96642 Presence of left artificial hip joint: Secondary | ICD-10-CM | POA: Diagnosis not present

## 2024-07-23 DIAGNOSIS — Z96661 Presence of right artificial ankle joint: Secondary | ICD-10-CM | POA: Diagnosis not present

## 2024-07-23 HISTORY — PX: TOTAL KNEE ARTHROPLASTY: SHX125

## 2024-07-23 LAB — CBC
HCT: 42.3 % (ref 39.0–52.0)
Hemoglobin: 13.8 g/dL (ref 13.0–17.0)
MCH: 29.6 pg (ref 26.0–34.0)
MCHC: 32.6 g/dL (ref 30.0–36.0)
MCV: 90.8 fL (ref 80.0–100.0)
Platelets: 197 K/uL (ref 150–400)
RBC: 4.66 MIL/uL (ref 4.22–5.81)
RDW: 13.2 % (ref 11.5–15.5)
WBC: 8.7 K/uL (ref 4.0–10.5)
nRBC: 0 % (ref 0.0–0.2)

## 2024-07-23 SURGERY — ARTHROPLASTY, KNEE, TOTAL
Anesthesia: Spinal | Site: Knee | Laterality: Right

## 2024-07-23 MED ORDER — ROPIVACAINE HCL 5 MG/ML IJ SOLN
INTRAMUSCULAR | Status: AC
  Filled 2024-07-23: qty 30

## 2024-07-23 MED ORDER — TRANEXAMIC ACID-NACL 1000-0.7 MG/100ML-% IV SOLN
1000.0000 mg | Freq: Once | INTRAVENOUS | Status: AC
  Administered 2024-07-23: 14:00:00 1000 mg via INTRAVENOUS
  Filled 2024-07-23: qty 100

## 2024-07-23 MED ORDER — METHOCARBAMOL 500 MG PO TABS
500.0000 mg | ORAL_TABLET | Freq: Four times a day (QID) | ORAL | Status: DC | PRN
  Administered 2024-07-23: 23:00:00 500 mg via ORAL
  Filled 2024-07-23: qty 1

## 2024-07-23 MED ORDER — ONDANSETRON HCL 4 MG/2ML IJ SOLN
INTRAMUSCULAR | Status: DC | PRN
  Administered 2024-07-23: 13:00:00 4 mg via INTRAVENOUS

## 2024-07-23 MED ORDER — 0.9 % SODIUM CHLORIDE (POUR BTL) OPTIME
TOPICAL | Status: DC | PRN
  Administered 2024-07-23: 11:00:00 1000 mL

## 2024-07-23 MED ORDER — CHLORHEXIDINE GLUCONATE 0.12 % MT SOLN
15.0000 mL | Freq: Once | OROMUCOSAL | Status: AC
  Administered 2024-07-23: 09:00:00 15 mL via OROMUCOSAL

## 2024-07-23 MED ORDER — VANCOMYCIN HCL 1500 MG/300ML IV SOLN
1500.0000 mg | Freq: Once | INTRAVENOUS | Status: AC
  Administered 2024-07-23: 11:00:00 1500 mg via INTRAVENOUS
  Filled 2024-07-23: qty 300

## 2024-07-23 MED ORDER — LACTATED RINGERS IV SOLN: INTRAVENOUS | Status: DC

## 2024-07-23 MED ORDER — PHENOL 1.4 % MT LIQD: 1.0000 | OROMUCOSAL | Status: DC | PRN

## 2024-07-23 MED ORDER — EPHEDRINE SULFATE (PRESSORS) 25 MG/5ML IV SOSY
PREFILLED_SYRINGE | INTRAVENOUS | Status: DC | PRN
  Administered 2024-07-23 (×2): 10 mg via INTRAVENOUS
  Administered 2024-07-23: 11:00:00 5 mg via INTRAVENOUS

## 2024-07-23 MED ORDER — VANCOMYCIN HCL IN DEXTROSE 1-5 GM/200ML-% IV SOLN
1000.0000 mg | Freq: Two times a day (BID) | INTRAVENOUS | Status: AC
  Administered 2024-07-23: 21:00:00 1000 mg via INTRAVENOUS
  Filled 2024-07-23 (×2): qty 200

## 2024-07-23 MED ORDER — ACETAMINOPHEN 325 MG PO TABS: 325.0000 mg | ORAL_TABLET | Freq: Four times a day (QID) | ORAL | Status: DC | PRN

## 2024-07-23 MED ORDER — PROPOFOL 500 MG/50ML IV EMUL
INTRAVENOUS | Status: DC | PRN
  Administered 2024-07-23: 11:00:00 100 ug/kg/min via INTRAVENOUS

## 2024-07-23 MED ORDER — MENTHOL 3 MG MT LOZG: 1.0000 | LOZENGE | OROMUCOSAL | Status: DC | PRN

## 2024-07-23 MED ORDER — METHOCARBAMOL 1000 MG/10ML IJ SOLN
500.0000 mg | Freq: Four times a day (QID) | INTRAMUSCULAR | Status: DC | PRN
  Administered 2024-07-23: 17:00:00 500 mg via INTRAVENOUS
  Filled 2024-07-23: qty 10

## 2024-07-23 MED ORDER — MIDAZOLAM HCL (PF) 2 MG/2ML IJ SOLN
INTRAMUSCULAR | Status: DC | PRN
  Administered 2024-07-23: 11:00:00 2 mg via INTRAVENOUS

## 2024-07-23 MED ORDER — METOCLOPRAMIDE HCL 5 MG PO TABS: 5.0000 mg | ORAL_TABLET | Freq: Three times a day (TID) | ORAL | Status: DC | PRN

## 2024-07-23 MED ORDER — DIPHENHYDRAMINE HCL 12.5 MG/5ML PO ELIX: 12.5000 mg | ORAL_SOLUTION | ORAL | Status: DC | PRN

## 2024-07-23 MED ORDER — HYDROMORPHONE HCL 1 MG/ML IJ SOLN
0.5000 mg | INTRAMUSCULAR | Status: DC | PRN
  Administered 2024-07-23: 15:00:00 0.5 mg via INTRAVENOUS
  Administered 2024-07-23: 23:00:00 1 mg via INTRAVENOUS
  Filled 2024-07-23: qty 0.5
  Filled 2024-07-23: qty 1

## 2024-07-23 MED ORDER — PANTOPRAZOLE SODIUM 40 MG PO TBEC
40.0000 mg | DELAYED_RELEASE_TABLET | Freq: Every day | ORAL | Status: DC
  Administered 2024-07-23 – 2024-07-24 (×2): 40 mg via ORAL
  Filled 2024-07-23 (×4): qty 1

## 2024-07-23 MED ORDER — METOCLOPRAMIDE HCL 5 MG/ML IJ SOLN: 5.0000 mg | Freq: Three times a day (TID) | INTRAMUSCULAR | Status: DC | PRN

## 2024-07-23 MED ORDER — TRANEXAMIC ACID-NACL 1000-0.7 MG/100ML-% IV SOLN
1000.0000 mg | INTRAVENOUS | Status: AC
  Administered 2024-07-23: 11:00:00 1000 mg via INTRAVENOUS

## 2024-07-23 MED ORDER — CELECOXIB 100 MG PO CAPS
200.0000 mg | ORAL_CAPSULE | Freq: Two times a day (BID) | ORAL | Status: DC
  Administered 2024-07-23 – 2024-07-24 (×2): 200 mg via ORAL
  Filled 2024-07-23 (×2): qty 2

## 2024-07-23 MED ORDER — ONDANSETRON HCL 4 MG PO TABS: 4.0000 mg | ORAL_TABLET | Freq: Four times a day (QID) | ORAL | Status: DC | PRN

## 2024-07-23 MED ORDER — BUPIVACAINE IN DEXTROSE 0.75-8.25 % IT SOLN
INTRATHECAL | Status: DC | PRN
  Administered 2024-07-23: 11:00:00 2 mL via INTRATHECAL

## 2024-07-23 MED ORDER — DEXAMETHASONE SOD PHOSPHATE PF 10 MG/ML IJ SOLN
INTRAMUSCULAR | Status: DC | PRN
  Administered 2024-07-23: 11:00:00 10 mg via INTRAVENOUS

## 2024-07-23 MED ORDER — ACETAMINOPHEN 500 MG PO TABS
1000.0000 mg | ORAL_TABLET | Freq: Four times a day (QID) | ORAL | Status: DC
  Administered 2024-07-24: 04:00:00 1000 mg via ORAL
  Filled 2024-07-23: qty 2

## 2024-07-23 MED ORDER — MAGNESIUM OXIDE -MG SUPPLEMENT 400 (240 MG) MG PO TABS
200.0000 mg | ORAL_TABLET | Freq: Every day | ORAL | Status: DC
  Administered 2024-07-24: 09:00:00 200 mg via ORAL
  Filled 2024-07-23: qty 1

## 2024-07-23 MED ORDER — BISACODYL 10 MG RE SUPP: 10.0000 mg | Freq: Every day | RECTAL | Status: DC | PRN

## 2024-07-23 MED ORDER — ONDANSETRON HCL 4 MG/2ML IJ SOLN: 4.0000 mg | Freq: Four times a day (QID) | INTRAMUSCULAR | Status: DC | PRN

## 2024-07-23 MED ORDER — VITAMIN D 25 MCG (1000 UNIT) PO TABS
2000.0000 [IU] | ORAL_TABLET | Freq: Every morning | ORAL | Status: DC
  Administered 2024-07-24: 09:00:00 2000 [IU] via ORAL
  Filled 2024-07-23 (×2): qty 2

## 2024-07-23 MED ORDER — SODIUM CHLORIDE 0.9 % IR SOLN
Status: DC | PRN
  Administered 2024-07-23: 11:00:00 3000 mL

## 2024-07-23 MED ORDER — FENTANYL CITRATE (PF) 100 MCG/2ML IJ SOLN
INTRAMUSCULAR | Status: DC | PRN
  Administered 2024-07-23 (×2): 50 ug via INTRAVENOUS

## 2024-07-23 MED ORDER — DOCUSATE SODIUM 100 MG PO CAPS
100.0000 mg | ORAL_CAPSULE | Freq: Two times a day (BID) | ORAL | Status: DC
  Administered 2024-07-23 – 2024-07-24 (×2): 100 mg via ORAL
  Filled 2024-07-23 (×4): qty 1

## 2024-07-23 MED ORDER — OXYCODONE HCL 5 MG PO TABS
5.0000 mg | ORAL_TABLET | ORAL | Status: DC | PRN
  Administered 2024-07-23: 21:00:00 5 mg via ORAL
  Administered 2024-07-24: 04:00:00 10 mg via ORAL
  Filled 2024-07-23: qty 1
  Filled 2024-07-23: qty 2

## 2024-07-23 MED ORDER — KETOROLAC TROMETHAMINE 30 MG/ML IJ SOLN
15.0000 mg | Freq: Once | INTRAMUSCULAR | Status: AC
  Administered 2024-07-23: 14:00:00 15 mg via INTRAVENOUS
  Filled 2024-07-23: qty 1

## 2024-07-23 MED ORDER — PREGABALIN 50 MG PO CAPS
50.0000 mg | ORAL_CAPSULE | Freq: Three times a day (TID) | ORAL | Status: DC
  Administered 2024-07-23 – 2024-07-24 (×3): 50 mg via ORAL
  Filled 2024-07-23 (×4): qty 1

## 2024-07-23 MED ORDER — MIDAZOLAM HCL 2 MG/2ML IJ SOLN
INTRAMUSCULAR | Status: AC
  Filled 2024-07-23: qty 2

## 2024-07-23 MED ORDER — BUPIVACAINE-MELOXICAM ER 200-6 MG/7ML IJ SOLN
INTRAMUSCULAR | Status: DC | PRN
  Administered 2024-07-23: 13:00:00 400 mg

## 2024-07-23 MED ORDER — PROPOFOL 500 MG/50ML IV EMUL
INTRAVENOUS | Status: AC
  Filled 2024-07-23: qty 250

## 2024-07-23 MED ORDER — POVIDONE-IODINE 10 % EX SWAB
2.0000 | Freq: Once | CUTANEOUS | Status: AC
  Administered 2024-07-23: 09:00:00 2 via TOPICAL

## 2024-07-23 MED ORDER — DEXAMETHASONE SODIUM PHOSPHATE 4 MG/ML IJ SOLN
8.0000 mg | Freq: Once | INTRAMUSCULAR | Status: AC
  Administered 2024-07-23: 17:00:00 8 mg via INTRAVENOUS
  Filled 2024-07-23 (×2): qty 2

## 2024-07-23 MED ORDER — TRANEXAMIC ACID-NACL 1000-0.7 MG/100ML-% IV SOLN
INTRAVENOUS | Status: AC
  Filled 2024-07-23: qty 100

## 2024-07-23 MED ORDER — OXYCODONE HCL 5 MG PO TABS
10.0000 mg | ORAL_TABLET | ORAL | Status: DC | PRN
  Administered 2024-07-24: 12:00:00 10 mg via ORAL
  Filled 2024-07-23: qty 2

## 2024-07-23 MED ORDER — SODIUM CHLORIDE 0.9 % IV SOLN: INTRAVENOUS | Status: AC

## 2024-07-23 MED ORDER — FENTANYL CITRATE (PF) 100 MCG/2ML IJ SOLN
INTRAMUSCULAR | Status: AC
  Filled 2024-07-23: qty 2

## 2024-07-23 MED ORDER — ACETAMINOPHEN 500 MG PO TABS
1000.0000 mg | ORAL_TABLET | Freq: Four times a day (QID) | ORAL | Status: DC
  Administered 2024-07-23 – 2024-07-24 (×3): 1000 mg via ORAL
  Filled 2024-07-23 (×3): qty 2

## 2024-07-23 MED ORDER — POLYETHYLENE GLYCOL 3350 17 G PO PACK
17.0000 g | PACK | Freq: Every day | ORAL | Status: DC
  Administered 2024-07-23 – 2024-07-24 (×2): 17 g via ORAL
  Filled 2024-07-23 (×4): qty 1

## 2024-07-23 MED ORDER — OXYCODONE HCL 5 MG PO TABS: 5.0000 mg | ORAL_TABLET | ORAL | Status: DC | PRN

## 2024-07-23 MED ORDER — BUPIVACAINE-MELOXICAM ER 200-6 MG/7ML IJ SOLN
INTRAMUSCULAR | Status: AC
  Filled 2024-07-23: qty 2

## 2024-07-23 MED ORDER — ENOXAPARIN SODIUM 30 MG/0.3ML IJ SOSY
30.0000 mg | PREFILLED_SYRINGE | Freq: Two times a day (BID) | INTRAMUSCULAR | Status: DC
  Administered 2024-07-24: 09:00:00 30 mg via SUBCUTANEOUS
  Filled 2024-07-23: qty 0.3

## 2024-07-23 MED ORDER — ORAL CARE MOUTH RINSE: 15.0000 mL | OROMUCOSAL | Status: DC | PRN

## 2024-07-23 SURGICAL SUPPLY — 54 items
ATTUNE MED DOME PAT 41 KNEE (Knees) IMPLANT
ATTUNE PS FEM RT SZ 8 CEM KNEE (Femur) IMPLANT
BASEPLATE TIB CMT FB PCKT SZ 9 (Knees) IMPLANT
BLADE SAG 18X100X1.27 (BLADE) IMPLANT
BLADE SAGITTAL 25.0X1.27X90 (BLADE) ×1 IMPLANT
BLADE SAW SGTL 11.0X1.19X90.0M (BLADE) ×1 IMPLANT
BNDG COMPR ESMARK 6X3 LF (GAUZE/BANDAGES/DRESSINGS) ×1 IMPLANT
CEMENT HV SMART SET (Cement) ×2 IMPLANT
CLOTH BEACON ORANGE TIMEOUT ST (SAFETY) ×1 IMPLANT
COOLER ICEMAN CLASSIC (MISCELLANEOUS) ×1 IMPLANT
COUNTER NDL MAGNETIC 40 RED (SET/KITS/TRAYS/PACK) ×1 IMPLANT
COUNTER NEEDLE MAGNETIC 40 RED (SET/KITS/TRAYS/PACK) ×1 IMPLANT
COVER LIGHT HANDLE STERIS (MISCELLANEOUS) ×2 IMPLANT
CUFF TRNQT CYL 34X4.125X (TOURNIQUET CUFF) ×1 IMPLANT
DRAPE BACK TABLE (DRAPES) ×1 IMPLANT
DRAPE EXTREMITY T 121X128X90 (DISPOSABLE) ×1 IMPLANT
DRESSING AQUACEL AG ADV 3.5X12 (MISCELLANEOUS) ×1 IMPLANT
DURAPREP 26ML APPLICATOR (WOUND CARE) ×2 IMPLANT
ELECTRODE REM PT RTRN 9FT ADLT (ELECTROSURGICAL) ×1 IMPLANT
GLOVE BIOGEL PI IND STRL 7.0 (GLOVE) ×6 IMPLANT
GLOVE BIOGEL PI IND STRL 8.5 (GLOVE) ×1 IMPLANT
GLOVE SKINSENSE STRL SZ8.0 LF (GLOVE) ×1 IMPLANT
GOWN STRL REUS W/TWL LRG LVL3 (GOWN DISPOSABLE) ×3 IMPLANT
GOWN STRL REUS W/TWL XL LVL3 (GOWN DISPOSABLE) ×1 IMPLANT
HOOD PEEL AWAY T7 (MISCELLANEOUS) ×4 IMPLANT
INSERT TIB ATTUNE FB SZ8X10 (Insert) IMPLANT
INST SET MAJOR BONE (KITS) ×1 IMPLANT
KIT TURNOVER KIT A (KITS) ×1 IMPLANT
MANIFOLD NEPTUNE II (INSTRUMENTS) ×1 IMPLANT
MARKER SKIN DUAL TIP RULER LAB (MISCELLANEOUS) ×1 IMPLANT
PACK TOTAL JOINT (CUSTOM PROCEDURE TRAY) ×1 IMPLANT
PAD ARMBOARD POSITIONER FOAM (MISCELLANEOUS) ×1 IMPLANT
PAD COLD SHLDR SM WRAP-ON (PAD) ×1 IMPLANT
PENCIL SMOKE EVACUATOR COATED (MISCELLANEOUS) ×1 IMPLANT
PILLOW KNEE EXTENSION 0 DEG (MISCELLANEOUS) ×1 IMPLANT
PIN STEINMAN FIXATION KNEE (PIN) ×1 IMPLANT
POSITIONER HEAD 8X9X4 ADT (SOFTGOODS) ×1 IMPLANT
SET BASIN LINEN APH (SET/KITS/TRAYS/PACK) ×1 IMPLANT
SET HNDPC FAN SPRY TIP SCT (DISPOSABLE) ×1 IMPLANT
SOL .9 NS 3000ML IRR UROMATIC (IV SOLUTION) ×1 IMPLANT
SOLN 0.9% NACL POUR BTL 1000ML (IV SOLUTION) ×1 IMPLANT
SOLN STERILE WATER BTL 1000 ML (IV SOLUTION) ×2 IMPLANT
SOLUTION IRRIG SURGIPHOR (IV SOLUTION) ×1 IMPLANT
STAPLER VISISTAT (STAPLE) ×1 IMPLANT
SUT BRALON NAB BRD #1 30IN (SUTURE) ×1 IMPLANT
SUT MNCRL 0 VIOLET CTX 36 (SUTURE) ×1 IMPLANT
SUT MON AB 0 CT1 (SUTURE) ×1 IMPLANT
SUT VIC AB 1 CT1 27XBRD ANTBC (SUTURE) IMPLANT
SYR BULB IRRIG 60ML STRL (SYRINGE) ×1 IMPLANT
TOWEL OR 17X26 4PK STRL BLUE (TOWEL DISPOSABLE) ×1 IMPLANT
TOWER CARTRIDGE SMART MIX (DISPOSABLE) ×1 IMPLANT
TRAY FOLEY MTR SLVR 16FR STAT (SET/KITS/TRAYS/PACK) ×1 IMPLANT
TUBING CONNECTOR 18X5MM (MISCELLANEOUS) ×1 IMPLANT
YANKAUER SUCT 12FT TUBE ARGYLE (SUCTIONS) ×1 IMPLANT

## 2024-07-23 NOTE — Anesthesia Preprocedure Evaluation (Signed)
 Anesthesia Evaluation  Patient identified by MRN, date of birth, ID band Patient awake    Reviewed: Allergy & Precautions, H&P , NPO status , Patient's Chart, lab work & pertinent test results, reviewed documented beta blocker date and time   Airway Mallampati: II  TM Distance: >3 FB Neck ROM: full    Dental no notable dental hx.    Pulmonary neg pulmonary ROS   Pulmonary exam normal breath sounds clear to auscultation       Cardiovascular Exercise Tolerance: Good hypertension, negative cardio ROS  Rhythm:regular Rate:Normal     Neuro/Psych negative neurological ROS  negative psych ROS   GI/Hepatic negative GI ROS, Neg liver ROS,GERD  ,,  Endo/Other  negative endocrine ROS    Renal/GU negative Renal ROS  negative genitourinary   Musculoskeletal   Abdominal   Peds  Hematology negative hematology ROS (+)   Anesthesia Other Findings   Reproductive/Obstetrics negative OB ROS                              Anesthesia Physical Anesthesia Plan  ASA: 2  Anesthesia Plan: Spinal   Post-op Pain Management: Regional block*   Induction:   PONV Risk Score and Plan: Ondansetron   Airway Management Planned:   Additional Equipment:   Intra-op Plan:   Post-operative Plan:   Informed Consent: I have reviewed the patients History and Physical, chart, labs and discussed the procedure including the risks, benefits and alternatives for the proposed anesthesia with the patient or authorized representative who has indicated his/her understanding and acceptance.     Dental Advisory Given  Plan Discussed with: CRNA  Anesthesia Plan Comments:         Anesthesia Quick Evaluation

## 2024-07-23 NOTE — Anesthesia Procedure Notes (Signed)
 Spinal  Start time: 07/23/2024 10:50 AM End time: 07/23/2024 10:55 AM  Staffing Performed: resident/CRNA  Authorized by: Kendell Yvonna PARAS, MD   Performed by: Cordella Elvie HERO, CRNA  Preanesthetic Checklist Completed: patient identified Spinal Block Patient position: sitting Prep: ChloraPrep Patient monitoring: heart rate, cardiac monitor, continuous pulse ox and blood pressure Approach: midline Location: L4-5 Injection technique: single-shot Needle Needle type: Pencan  Needle gauge: 24 G Assessment Sensory level: T6

## 2024-07-23 NOTE — Transfer of Care (Signed)
 Immediate Anesthesia Transfer of Care Note  Patient: Dillon [name]  Procedure(s) Performed: ARTHROPLASTY, KNEE, TOTAL (Right: Knee)  Patient Location: PACU  Anesthesia Type:Spinal  Level of Consciousness: awake, alert , oriented, and patient cooperative  Airway & Oxygen Therapy: Patient Spontanous Breathing  Post-op Assessment: Report given to RN, Post -op Vital signs reviewed and stable, and Patient moving all extremities X 4  Post vital signs: Reviewed and stable  Last Vitals:  Vitals Value Taken Time  BP    Temp    Pulse    Resp    SpO2      Last Pain:  Vitals:   07/23/24 0847  PainSc: 0-No pain         Complications: No notable events documented.

## 2024-07-23 NOTE — Progress Notes (Signed)
°   07/23/24 1454  TOC Brief Assessment  Insurance and Status Reviewed  Patient has primary care physician Yes  Home environment has been reviewed From Home  Prior level of function: Independent  Prior/Current Home Services No current home services  Social Drivers of Health Review SDOH reviewed no interventions necessary  Readmission risk has been reviewed Yes  Transition of care needs transition of care needs identified, TOC will continue to follow   Pending PT consult for recommendations. ICM will continue to follow.

## 2024-07-23 NOTE — Op Note (Signed)
 Dictation for total knee replacement  Orthopaedic Surgery Operative Note (CSN: [id number])  Dillon [name]  14-Oct-1955 Date of Surgery: 07/23/2024   Diagnoses:  Right  knee osteoarthritis  Procedure: Right total knee arthroplasty   TXA used   Operative Finding Normal bone appearance of the lateral compartment especially the femur osteophytes posterolateral tibia surrounding the femur medial tibia  The median ridge of the patella was completely 1 OA with grade 4 arthritis of the remaining area  The medial compartment even had grade 3 arthritis  Bone cuts Distal femur -set for 8 an additional 2 mm were taken because the initial cut did not cut any lateral femur  Proximal tibia -we referenced the lateral compartment 5 mm resection (total of 12 mm of proximal tibial bone was removed) to 12 mm was on the medial side  Patella -23-1/2 down to 10  Post-Op Diagnosis: Same Surgeons:Primary: Margrette Taft FORBES, MD Assistants: Montie armin Constant Location: [facility] ROOM 4 Anesthesia: Spinal Antibiotics: Vancomycin  1500 mg Tourniquet time: 90 minutes with tourniquet pressure 280 mm Estimated Blood Loss: 50 cc Complications: None Specimens: None   Implants: Implant Name Type Inv. Item Serial No. Manufacturer Lot No. LRB No. Used Action  CEMENT HV SMART SET - [id number] Cement CEMENT HV SMART SET  [employer] [id number] Right 2 Implanted  INSERT TIB ATTUNE FB [id number] - [id number] Insert INSERT TIB ATTUNE FB [id number]  [employer] OR[employer] [id number] Right 1 Implanted  ATTUNE MED DOME PAT 41 KNEE - [id number] Knees ATTUNE MED DOME PAT 41 KNEE  [employer] [id number] Right 1 Implanted  ATTUNE PS FEM RT SZ 8 CEM KNEE - [id number] Femur ATTUNE PS FEM RT SZ 8 CEM KNEE  [employer] [id number] Right 1 Implanted  BASEPLATE TIB CMT FB PCKT SZ 9 - [id number] Knees BASEPLATE TIB CMT FB PCKT SZ 9  [employer] [id number] Right 1 Implanted     Indications for Surgery:   Disabling pain of  the right knee which did not improve despite nonoperative measures. The benefits and risks of operative and nonoperative management were discussed prior to surgery with patient/guardian(s) and informed consent form was completed.  While all risks cannot be anticipated, specific risks including infection, need for additional surgery, stiffness, postop pain, infection, implant removal, loosening, infection requiring amputation, deep vein thrombosis, pulmonary embolus were discussed.   Procedure:    Details of surgery: The patient was identified by 2 approved identification mechanisms. The operative extremity was evaluated and found to be acceptable for surgical treatment today. The chart was reviewed. The surgical site was confirmed and marked over the right knee   Saphenous nerve block was planned and completed in the PACU yes [Y/N]   The patient was taken to the operating room and given vancomycin  antibiotic.  This is consistent with the SCIP protocol.  The patient was given the following anesthetic: Spinal  The patient was then placed supine on the operating table. A Foley catheter was inserted. The operative extremity was prepped and draped sterilely from the toes to the groin.  Timeout was executed confirming the patient's name, surgical site, antibiotic administration, x-rays available, and implants available.  The operative limb,  was exsanguinated with a six-inch Esmarch and the tourniquet was inflated to 280 mmHg.  A straight midline incision was made over the right KNEE and taken down to the extensor mechanism. A medial arthrotomy was performed. The patella was everted and the patellofemoral soft tissue was released, along with the patellar fat pad.  The  anterior cruciate ligament and PCL were resected. The anterior horns of the lateral and medial meniscus were resected. The medial soft tissue sleeve was elevated to the mid coronal plane.   A three-eighths inch drill bit was used to  enter the femoral canal which was decompressed with suction and irrigation until clear.  The distal femoral cutting guide was set for 9 mm distal resection,  4valgus alignment, for a right knee. The distal femur was resected and checked for flatness.  And measured resection was only 8 mm.  This block was dropped back 2 mm as the lateral condyle was not prominent enough to remove bone with the initial 8 mm resection The attune sizing femoral guide was placed and the femur was preliminarily sized to a size it was between a 9 and 8  The external alignment guide for the tibial resection was then applied to the distal and proximal tibia and set for 3 degree posterior slope along with 5 MM resection  from the deficient lateral side.  Rotational alignment was set using the malleolus, the tibial tubercle and the tibial spines. The proximal tibia was resected along with  residual menisci. The tibia was sized using a base plate to a size 8.     I then placed an extension gap spacer block 5678 and 10 with 10 feeding best.  I put laminar spreaders in place to balance the extension gap as the lateral side was tight.  We used a pie crusting technique and continued pie crusting until we got a balanced extension gap with a 10 mm block in place  The block was moved down [Y/N] yes.  The size 10 spacer block balance the flexion gap.  Rotation was assessed using Whitesides line and the epicondyles.  Once I was satisfied with the spacer block collateral ligament retractors were placed and I completed the 4 distal femoral cuts   The extension gap was rechecked with the size 10 spacer block.  The flexion and extension gaps were now balanced and I proceeded to cut the notch in the femur. The correct sized notch cutting guide for the femur was then applied and the notch cut was made.   Trial reduction was completed using size 8 tibia 9 femur trial implants. Patella tracking was normal  We then skeletonized the  patella. It measured 22-1/2 in thickness and the patellar resection was set for 0.5 millimeters. the patellar resection was completed. The patella diameter measured 41. We then drilled the peg holes for the patella.    The proximal tibia was prepared using the size 8 base plate.  Thorough irrigation was performed using saline  and the bone was dried and prepared for cement. The cement was mixed on the back table using third generation preparation techniques  The implants were then cemented in place and excess cement was removed. The cement was allowed to cure.  Normal saline irrigation followed by Surgipor irrigation followed by normal saline irrigation  Any excess bone fragments and cement were removed.  Closure was done with the following sutures  Extensor mechanism was closed with #1 Vicryl and #1 Surgilon.  We injected the Zen relief and then closed the opening in the extensor mechanism with #1 Vicryl  We then used 0 Monocryl interrupted and 0 Monocryl running to close the skin  Skin approximation was performed using staples  A sterile dressing was applied, TED hose were placed on the operative extremity followed by Cryo/Cuff.  The patient was taken recovery room  in stable condition  Postop plan: Weightbearing as tolerated CPM machine Immediate physical therapy Discharge tomorrow if stable

## 2024-07-23 NOTE — Interval H&P Note (Signed)
 History and Physical Interval Note:  07/23/2024 10:19 AM  Dillon [name]  has presented today for surgery, with the diagnosis of Left knee osteoarthritis.  The various methods of treatment have been discussed with the patient and family. After consideration of risks, benefits and other options for treatment, the patient has consented to  Procedures: ARTHROPLASTY, KNEE, TOTAL (Right) as a surgical intervention.  The patient's history has been reviewed, patient examined, no change in status, stable for surgery.  I have reviewed the patient's chart and labs.  Questions were answered to the patient's satisfaction.     [name]

## 2024-07-23 NOTE — Brief Op Note (Signed)
 07/23/2024  12:59 PM  PATIENT:  Dillon [name]  68 y.o. male  PRE-OPERATIVE DIAGNOSIS:  Right  knee osteoarthritis  POST-OPERATIVE DIAGNOSIS:  Right  knee osteoarthritis  PROCEDURE:  Procedures: ARTHROPLASTY, KNEE, TOTAL (Right)  SURGEON:  Surgeons and Role:    DEWAINE Margrette Taft FORBES, MD - Primary  PHYSICIAN ASSISTANT:   ASSISTANTS: cynthia and harley    ANESTHESIA:   spinal  EBL:  75 mL   BLOOD ADMINISTERED:none  DRAINS: none   LOCAL MEDICATIONS USED:  OTHER  Marcaine  with meloxicam    SPECIMEN:  No Specimen  DISPOSITION OF SPECIMEN:  N/A  COUNTS:  YES  TOURNIQUET:   Total Tourniquet Time Documented: Thigh (Right) - 90 minutes Total: Thigh (Right) - 90 minutes   DICTATION: .Nechama Dictation  PLAN OF CARE: Admit for overnight observation  PATIENT DISPOSITION:  PACU - hemodynamically stable.   Delay start of Pharmacological VTE agent (>24hrs) due to surgical blood loss or risk of bleeding: yes

## 2024-07-24 ENCOUNTER — Encounter (HOSPITAL_COMMUNITY): Payer: Self-pay | Admitting: Orthopedic Surgery

## 2024-07-24 DIAGNOSIS — M1711 Unilateral primary osteoarthritis, right knee: Secondary | ICD-10-CM | POA: Diagnosis not present

## 2024-07-24 LAB — TYPE AND SCREEN
ABO/RH(D): O POS
Antibody Screen: NEGATIVE
Unit division: 0
Unit division: 0
Unit division: 0
Unit division: 0

## 2024-07-24 LAB — BPAM RBC
Blood Product Expiration Date: 202601042359
Blood Product Expiration Date: 202601042359
Blood Product Expiration Date: 202601122359
Blood Product Expiration Date: 202601132359
Unit Type and Rh: 5100
Unit Type and Rh: 5100
Unit Type and Rh: 5100
Unit Type and Rh: 5100

## 2024-07-24 LAB — CBC
HCT: 34.2 % — ABNORMAL LOW (ref 39.0–52.0)
Hemoglobin: 11.4 g/dL — ABNORMAL LOW (ref 13.0–17.0)
MCH: 30 pg (ref 26.0–34.0)
MCHC: 33.3 g/dL (ref 30.0–36.0)
MCV: 90 fL (ref 80.0–100.0)
Platelets: 189 K/uL (ref 150–400)
RBC: 3.8 MIL/uL — ABNORMAL LOW (ref 4.22–5.81)
RDW: 13.2 % (ref 11.5–15.5)
WBC: 12.5 K/uL — ABNORMAL HIGH (ref 4.0–10.5)
nRBC: 0 % (ref 0.0–0.2)

## 2024-07-24 LAB — BASIC METABOLIC PANEL WITH GFR
Anion gap: 7 (ref 5–15)
BUN: 17 mg/dL (ref 8–23)
CO2: 27 mmol/L (ref 22–32)
Calcium: 8.2 mg/dL — ABNORMAL LOW (ref 8.9–10.3)
Chloride: 102 mmol/L (ref 98–111)
Creatinine, Ser: 0.9 mg/dL (ref 0.61–1.24)
GFR, Estimated: >60 mL/min (ref >60)
Glucose, Bld: 151 mg/dL — ABNORMAL HIGH (ref 70–99)
Potassium: 4.4 mmol/L (ref 3.5–5.1)
Sodium: 136 mmol/L (ref 135–145)

## 2024-07-24 MED ORDER — DOCUSATE SODIUM 100 MG PO CAPS: 100.0000 mg | ORAL_CAPSULE | Freq: Two times a day (BID) | ORAL | 0 refills | Status: AC

## 2024-07-24 MED ORDER — CELECOXIB 200 MG PO CAPS: 200.0000 mg | ORAL_CAPSULE | Freq: Two times a day (BID) | ORAL | 1 refills | Status: AC

## 2024-07-24 MED ORDER — METHOCARBAMOL 500 MG PO TABS: 500.0000 mg | ORAL_TABLET | Freq: Four times a day (QID) | ORAL | 1 refills | Status: DC | PRN

## 2024-07-24 MED ORDER — ACETAMINOPHEN 500 MG PO TABS: 1000.0000 mg | ORAL_TABLET | Freq: Four times a day (QID) | ORAL | 0 refills | Status: AC

## 2024-07-24 MED ORDER — POLYETHYLENE GLYCOL 3350 17 G PO PACK: 17.0000 g | PACK | Freq: Every day | ORAL | 0 refills | Status: AC

## 2024-07-24 NOTE — Evaluation (Signed)
 Physical Therapy Evaluation Patient Details Name: Dillon [name] MRN: [id number] DOB: 1956-05-20 Today's Date: 07/24/2024   RIGHT KNEE ROM: 0 - 100 degrees AMBULATION DISTANCE: 120 feet using RW with Modified Independence    History of Present Illness  Dillon [name] is a 68 y/o male, s/p Left TKA on 07/23/24, with the diagnosis of Left knee osteoarthritis.  Clinical Impression  Patient demonstrates good return for going up/down steps in stair using 1 side rail and cane, fair return for going up/down using cane only requiring Min/CGA and understanding acknowledged to have assistance when going/up down stairs at home. Patient tolerated up to 100 degrees right knee self stretching at bedside and tolerated sitting up in chair with RLE dangling after therapy. Patient will benefit from continued skilled physical therapy in hospital and recommended venue below to increase strength, balance, endurance for safe ADLs and gait.       If plan is discharge home, recommend the following: A little help with walking and/or transfers;A little help with bathing/dressing/bathroom;Help with stairs or ramp for entrance;Assist for transportation;Assistance with cooking/housework   Can travel by private vehicle        Equipment Recommendations None recommended by PT  Recommendations for Other Services       Functional Status Assessment Patient has had a recent decline in their functional status and demonstrates the ability to make significant improvements in function in a reasonable and predictable amount of time.     Precautions / Restrictions Precautions Precautions: Fall Recall of Precautions/Restrictions: Intact Restrictions Weight Bearing Restrictions Per Provider Order: No      Mobility  Bed Mobility Overal bed mobility: Modified Independent                  Transfers Overall transfer level: Modified independent Equipment used: Rolling walker (2 wheels)                General transfer comment: fair/good eturn for transferring to/from commode in bathroom using RW    Ambulation/Gait Ambulation/Gait assistance: Modified independent (Device/Increase time) Gait Distance (Feet): 120 Feet Assistive device: Rolling walker (2 wheels) Gait Pattern/deviations: Decreased step length - right, Decreased step length - left, Decreased stance time - right, Decreased stride length, Antalgic Gait velocity: decreased     General Gait Details: fair/good return for right heel to toe stepping using RW without loss  of balance, required verbal cueing to push RW rather than picking up with good carryover demonstrated  Stairs Stairs: Yes Stairs assistance: Supervision, Contact guard assist Stair Management: One rail Right, With cane Number of Stairs: 6 General stair comments: demonstrates good return for going up/down steps using 1 side rail and SPC, but required Min/CGA assist using SPC only and advised to have assistance for stairs at home with understanding acknowledged  Wheelchair Mobility     Tilt Bed    Modified Rankin (Stroke Patients Only)       Balance Overall balance assessment: Needs assistance Sitting-balance support: Feet supported, No upper extremity supported Sitting balance-Leahy Scale: Good Sitting balance - Comments: seated at EOB   Standing balance support: During functional activity, Bilateral upper extremity supported Standing balance-Leahy Scale: Fair Standing balance comment: fair/good using RW                             Pertinent Vitals/Pain Pain Assessment Pain Assessment: Faces Faces Pain Scale: Hurts little more Pain Location: Right knee Pain Descriptors / Indicators: Sore, Discomfort  Home Living Family/patient expects to be discharged to:: Private residence Living Arrangements: Spouse/significant other Available Help at Discharge: Family;Available 24 hours/day Type of Home: House Home Access: Stairs to  enter Entrance Stairs-Rails: None Entrance Stairs-Number of Steps: 1 Alternate Level Stairs-Number of Steps: 6 steps with right siderail to bedroom, 9 steps to basement with right side rail Home Layout: Two level Home Equipment: Agricultural Consultant (2 wheels);BSC/3in1      Prior Function Prior Level of Function : Independent/Modified Independent;Driving             Mobility Comments: Community ambulation without AD, drives, works as clinical biochemist ADLs Comments: Independent     Extremity/Trunk Assessment   Upper Extremity Assessment Upper Extremity Assessment: Overall WFL for tasks assessed    Lower Extremity Assessment Lower Extremity Assessment: Overall WFL for tasks assessed;RLE deficits/detail RLE Deficits / Details: grossly -4/5 RLE: Unable to fully assess due to pain RLE Sensation: WNL RLE Coordination: WNL    Cervical / Trunk Assessment Cervical / Trunk Assessment: Normal  Communication   Communication Communication: No apparent difficulties    Cognition Arousal: Alert Behavior During Therapy: WFL for tasks assessed/performed   PT - Cognitive impairments: No apparent impairments                         Following commands: Intact       Cueing Cueing Techniques: Verbal cues     General Comments      Exercises Total Joint Exercises Ankle Circles/Pumps: 10 reps, Right, Strengthening, AROM, Supine Quad Sets: AROM, Strengthening, Right, 10 reps, Supine Short Arc Quad: AROM, Strengthening, Right, 10 reps, Supine Heel Slides: AROM, Strengthening, Right, 10 reps, Supine Goniometric ROM: right knee ROM: 0 - 100 degrees   Assessment/Plan    PT Assessment Patient needs continued PT services  PT Problem List Decreased strength;Decreased activity tolerance;Decreased balance;Decreased mobility;Decreased range of motion;Pain       PT Treatment Interventions DME instruction;Gait training;Stair training;Functional mobility  training;Therapeutic activities;Therapeutic exercise;Balance training;Patient/family education    PT Goals (Current goals can be found in the Care Plan section)  Acute Rehab PT Goals Patient Stated Goal: return home wtih family to assist PT Goal Formulation: With patient Time For Goal Achievement: 07/25/24 Potential to Achieve Goals: Good    Frequency BID     Co-evaluation               AM-PAC PT 6 Clicks Mobility  Outcome Measure Help needed turning from your back to your side while in a flat bed without using bedrails?: None Help needed moving from lying on your back to sitting on the side of a flat bed without using bedrails?: None Help needed moving to and from a bed to a chair (including a wheelchair)?: A Little Help needed standing up from a chair using your arms (e.g., wheelchair or bedside chair)?: A Little Help needed to walk in hospital room?: A Little Help needed climbing 3-5 steps with a railing? : A Little 6 Click Score: 20    End of Session   Activity Tolerance: Patient tolerated treatment well;Patient limited by fatigue Patient left: in chair;with call bell/phone within reach;with family/visitor present Nurse Communication: Mobility status PT Visit Diagnosis: Unsteadiness on feet (R26.81);Other abnormalities of gait and mobility (R26.89);Muscle weakness (generalized) (M62.81)    Time: 9154-9082 PT Time Calculation (min) (ACUTE ONLY): 32 min   Charges:   PT Evaluation $PT Eval Moderate Complexity: 1 Mod PT Treatments $Gait Training: 23-37  mins PT General Charges $$ ACUTE PT VISIT: 1 Visit         10:59 AM, 07/24/2024 Lynwood Music, MPT Physical Therapist with [facility] 336 [phone] office [phone] mobile phone

## 2024-07-24 NOTE — Plan of Care (Signed)

## 2024-07-24 NOTE — Progress Notes (Signed)
 Subjective: 1 Day Post-Op Procedures (LRB): ARTHROPLASTY, KNEE, TOTAL (Right) Patient reports pain as mild.    Objective: Vital signs in last 24 hours:BP 136/79 (BP Location: Right Arm)   Pulse 77   Temp 97.9 F (36.6 C) (Oral)   Resp 18   Ht 6' (1.829 m)   Wt 105.7 kg   SpO2 98%   BMI 31.60 kg/m    Intake/Output from previous day: 12/16 0701 - 12/17 0700 In: 3255 [P.O.:480; I.V.:2375; IV Piggyback:400] Out: 2625 [Urine:2550; Blood:75] Intake/Output this shift: No intake/output data recorded.  Recent Labs    07/23/24 1501 07/24/24 0443  HGB 13.8 11.4*   Recent Labs    07/23/24 1501 07/24/24 0443  WBC 8.7 12.5*  RBC 4.66 3.80*  HCT 42.3 34.2*  PLT 197 189   Recent Labs    07/24/24 0443  NA 136  K 4.4  CL 102  CO2 27  BUN 17  CREATININE 0.90  GLUCOSE 151*  CALCIUM 8.2*   No results for input(s): LABPT, INR in the last 72 hours.  Neurologically intact ABD soft Neurovascular intact Sensation intact distally Intact pulses distally Dorsiflexion/Plantar flexion intact Incision: dressing C/D/I Compartment soft   Assessment/Plan: 1 Day Post-Op Procedures (LRB): ARTHROPLASTY, KNEE, TOTAL (Right) Discharge home with home health   Dillon [name] 07/24/2024, 7:24 AM

## 2024-07-24 NOTE — Plan of Care (Signed)
°  Problem: Acute Rehab PT Goals(only PT should resolve) Goal: Pt Will Go Supine/Side To Sit Outcome: Progressing Flowsheets (Taken 07/24/2024 1100) Pt will go Supine/Side to Sit: Independently Goal: Patient Will Transfer Sit To/From Stand Outcome: Progressing Flowsheets (Taken 07/24/2024 1100) Patient will transfer sit to/from stand:  Independently  with modified independence Goal: Pt Will Transfer Bed To Chair/Chair To Bed Outcome: Progressing Flowsheets (Taken 07/24/2024 1100) Pt will Transfer Bed to Chair/Chair to Bed:  with modified independence  Independently Goal: Pt Will Ambulate Outcome: Progressing Flowsheets (Taken 07/24/2024 1100) Pt will Ambulate:  > 125 feet  with modified independence  with rolling walker   11:01 AM, 07/24/2024 Lynwood Music, MPT Physical Therapist with [facility] [phone] office [phone] mobile phone

## 2024-07-25 NOTE — Discharge Summary (Signed)
 Physician Discharge Summary  Patient ID: Dillon [name] MRN: [id number] DOB/AGE: 68/68/1957 68 y.o.  Admit date: 07/23/2024 Discharge date: 07/24/2024  Admission Diagnoses: RIGHT  Knee osteoarthritis  Discharge Diagnoses: RIGHT Knee osteoarthritis  Discharged Condition: Stable  Procedure: RIGHT  knee arthroplasty  Hospital Course:  Hospital day 1 uncomplicated total knee arthroplasty RIGHT  knee - Hospital day 2 the patient continued to do well remained afebrile with stable vital signs -Advance in physical therapy tolerating the stairs with good pain control  ROM REPORTED 100 DEGREES   Implant Name Type Inv. Item Serial No. Manufacturer Lot No. LRB No. Used Action  CEMENT HV SMART SET - [id number] Cement CEMENT HV SMART SET  [employer] [id number] Right 2 Implanted  INSERT TIB ATTUNE FB [id number] - [id number] Insert INSERT TIB ATTUNE FB [id number]  [employer] [id number] Right 1 Implanted  ATTUNE MED DOME PAT 41 KNEE - [id number] Knees ATTUNE MED DOME PAT 41 KNEE  [employer] [id number] Right 1 Implanted  ATTUNE PS FEM RT SZ 8 CEM KNEE - [id number] Femur ATTUNE PS FEM RT SZ 8 CEM KNEE  [employer] [id number] Right 1 Implanted  BASEPLATE TIB CMT FB PCKT SZ 9 - [id number] Knees BASEPLATE TIB CMT FB PCKT SZ 9  [employer] [id number] Right 1 Implanted    Lab reports:    Latest Ref Rng & Units 07/24/2024    4:43 AM 07/23/2024    3:01 PM 07/17/2024    2:55 PM  CBC  WBC 4.0 - 10.5 K/uL 12.5  8.7  6.8   Hemoglobin 13.0 - 17.0 g/dL 88.5  86.1  85.1   Hematocrit 39.0 - 52.0 % 34.2  42.3  44.5   Platelets 150 - 400 K/uL 189  197  216       Latest Ref Rng & Units 07/24/2024    4:43 AM 07/17/2024    2:55 PM 12/20/2023    8:06 AM  BMP  Glucose 70 - 99 mg/dL 848  81  892   BUN 8 - 23 mg/dL 17  16  16    Creatinine 0.61 - 1.24 mg/dL 9.09  9.22  9.10   BUN/Creat Ratio 10 - 24   18   Sodium 135 - 145 mmol/L 136  140  143   Potassium 3.5 - 5.1 mmol/L 4.4  4.4  4.6    Chloride 98 - 111 mmol/L 102  104  104   CO2 22 - 32 mmol/L 27  22  23    Calcium 8.9 - 10.3 mg/dL 8.2  9.3  9.6       Discharge Exam: BP 136/79 (BP Location: Right Arm)   Pulse 77   Temp 97.9 F (36.6 C) (Oral)   Resp 18   Ht 6' (1.829 m)   Wt 105.7 kg   SpO2 98%   BMI 31.60 kg/m  Physical Exam Skin:    General: Skin is warm.  Neurological:     Mental Status: He is alert and oriented to person, place, and time.     Gait: Gait abnormal.  Psychiatric:        Mood and Affect: Mood normal.        Behavior: Behavior normal.        Thought Content: Thought content normal.        Judgment: Judgment normal.    DRESSING DRY CALF SFT (-) HOMANS SIGN    Disposition: Discharge disposition: 01-Home or Self Care       Discharge Instructions  Call MD / Call 911   Complete by: As directed    If you experience chest pain or shortness of breath, CALL 911 and be transported to the hospital emergency room.  If you develope a fever above 101 F, pus (white drainage) or increased drainage or redness at the wound, or calf pain, call your surgeon's office.   Constipation Prevention   Complete by: As directed    Drink plenty of fluids.  Prune juice may be helpful.  You may use a stool softener, such as Colace (over the counter) 100 mg twice a day.  Use MiraLax  (over the counter) for constipation as needed.   Discharge instructions   Complete by: As directed    Dressing : do not change; if the middle turns black call the office for a new dressing   Blue foam pillow: 30 min 3 x a day   CPM: 6 hrs per day; start at 0-90 increase 10 per day as needed   Exercises: as instructed by PT  Pain:  1. Use the ice pad as much as possible to help with pain and swelling, 30 min every 2 hrs  2. Tylenol  [phone] mg every 6 hrs  3.  Celebrex  200 milligrams twice a day 4. Oxycodone  5-10 mg every 4-6 hrs IF the above medications are not working   Increase activity slowly as tolerated    Complete by: As directed    Post-operative opioid taper instructions:   Complete by: As directed    POST-OPERATIVE OPIOID TAPER INSTRUCTIONS: It is important to wean off of your opioid medication as soon as possible. If you do not need pain medication after your surgery it is ok to stop day one. Opioids include: Codeine, Hydrocodone (Norco, Vicodin), Oxycodone (Percocet, oxycontin ) and hydromorphone  amongst others.  Long term and even short term use of opiods can cause: Increased pain response Dependence Constipation Depression Respiratory depression And more.  Withdrawal symptoms can include Flu like symptoms Nausea, vomiting And more Techniques to manage these symptoms Hydrate well Eat regular healthy meals Stay active Use relaxation techniques(deep breathing, meditating, yoga) Do Not substitute Alcohol to help with tapering If you have been on opioids for less than two weeks and do not have pain than it is ok to stop all together.  Plan to wean off of opioids This plan should start within one week post op of your joint replacement. Maintain the same interval or time between taking each dose and first decrease the dose.  Cut the total daily intake of opioids by one tablet each day Next start to increase the time between doses. The last dose that should be eliminated is the evening dose.         Allergies as of 07/24/2024   No Known Allergies      Medication List     STOP taking these medications    predniSONE  20 MG tablet Commonly known as: DELTASONE        TAKE these medications    acetaminophen  500 MG tablet Commonly known as: TYLENOL  Take 2 tablets (1,000 mg total) by mouth every 6 (six) hours.   amLODipine  5 MG tablet Commonly known as: NORVASC  Take 1 tablet (5 mg total) by mouth daily.   aspirin  EC 325 MG tablet Take 1 tablet (325 mg total) by mouth daily.   celecoxib  200 MG capsule Commonly known as: CELEBREX  Take 1 capsule (200 mg total) by  mouth 2 (two) times daily.   docusate sodium  100 MG capsule Commonly known as:  COLACE Take 1 capsule (100 mg total) by mouth 2 (two) times daily.   famotidine  20 MG tablet Commonly known as: PEPCID  Take 1 tablet (20 mg total) by mouth daily.   Magnesium  250 MG Tabs Take 1 tablet by mouth daily.   methocarbamol  500 MG tablet Commonly known as: ROBAXIN  Take 1 tablet (500 mg total) by mouth every 6 (six) hours as needed for muscle spasms.   NYQUIL PO Take 10 mLs by mouth at bedtime as needed.   OVER THE COUNTER MEDICATION Take 3 each by mouth at bedtime. Hemp gummies   oxyCODONE  5 MG immediate release tablet Commonly known as: Oxy IR/ROXICODONE  Take 1 tablet (5 mg total) by mouth every 4 (four) hours as needed for moderate pain (pain score 4-6).   polyethylene glycol 17 g packet Commonly known as: MIRALAX  / GLYCOLAX  Take 17 g by mouth daily.   RABEprazole  20 MG tablet Commonly known as: ACIPHEX  Take 1 tablet (20 mg total) by mouth daily.   Vitamin D3 50 MCG (2000 UT) Tabs Take 2,000 Units by mouth in the morning.         Signed: [name] 07/25/2024, 7:35 AM

## 2024-07-26 ENCOUNTER — Telehealth: Payer: Self-pay

## 2024-07-26 NOTE — Anesthesia Postprocedure Evaluation (Signed)
"   Anesthesia Post Note  Patient: Dillon [name]  Procedure(s) Performed: ARTHROPLASTY, KNEE, TOTAL (Right: Knee)  Patient location during evaluation: Phase II Anesthesia Type: Spinal Level of consciousness: awake Pain management: pain level controlled Vital Signs Assessment: post-procedure vital signs reviewed and stable Respiratory status: spontaneous breathing and respiratory function stable Cardiovascular status: blood pressure returned to baseline and stable Postop Assessment: no headache and no apparent nausea or vomiting Anesthetic complications: no Comments: Late entry   No notable events documented.   Last Vitals:  Vitals:   07/23/24 2039 07/24/24 0413  BP: 136/77 136/79  Pulse: 80 77  Resp: 18 18  Temp: 36.9 C 36.6 C  SpO2: 93% 98%    Last Pain:  Vitals:   07/24/24 1139  TempSrc:   PainSc: 7                  [employer]      "

## 2024-07-26 NOTE — Telephone Encounter (Signed)
 Patient called stating that he hasn't heard from home health and was wondering if there was something that he needed to do.  Please call and advise

## 2024-07-29 ENCOUNTER — Telehealth: Payer: Self-pay | Admitting: Orthopedic Surgery

## 2024-07-29 NOTE — Telephone Encounter (Signed)
 Dr. Areatha pt - spoke w/the pt to schedule his postop appointment.  He stated that he has not had any PT yet.  He stated that he text Dr. Margrette this weekend and that he's supposed to follow up w/[facility] this morning.  Advised I'd also send a message out on this end.  [phone]

## 2024-07-30 ENCOUNTER — Telehealth: Payer: Self-pay

## 2024-07-30 NOTE — Telephone Encounter (Signed)
 325 ASA Ok to not wear stockings at night if he is able to put them back on in the am, otherwise wear all the time  Gave verbal orders

## 2024-07-30 NOTE — Telephone Encounter (Signed)
 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 He called for some clarification about a couple of things: Does the patient need to continue wearing the compression stocking 24 hours a day or can he take it off at night?  He also stated that the patient's wife is giving him 81 mg Aspirin , but his paperwork says he is to take 325 mg aspirin , should he stop 81 mg and take the 325 MG Aspirin  or continue with the 81 mg. He wants to see him 2 times this week and 1 time next week.  Please call and advise [phone]

## 2024-07-30 NOTE — Telephone Encounter (Signed)
 I have emailed w/ Leita at [facility], she has confirmed they will attempt to get HHPT appt tmrw for patient.  I also s/w patient and he says he is doing well, walking some, went to church Sunday, is doing leg lifts, and has CPM.  He has some bruising that was of concern, just appeared yesterday.  I told him this can be normal.  He has not other complaints and is doing well otherwise.  Has appt to see Dr Margrette for postop on 08/07/24.

## 2024-07-30 NOTE — Telephone Encounter (Signed)
 I called last night after 530 pm, got the triage line, who sent another message to the [place] office.  I spoke with the [place] office 3 x's yesterday as well.  I called again this morning.

## 2024-08-05 DIAGNOSIS — Z96651 Presence of right artificial knee joint: Secondary | ICD-10-CM | POA: Insufficient documentation

## 2024-08-06 ENCOUNTER — Ambulatory Visit (HOSPITAL_COMMUNITY): Attending: Orthopedic Surgery

## 2024-08-06 DIAGNOSIS — M25661 Stiffness of right knee, not elsewhere classified: Secondary | ICD-10-CM | POA: Diagnosis present

## 2024-08-06 DIAGNOSIS — M1711 Unilateral primary osteoarthritis, right knee: Secondary | ICD-10-CM | POA: Diagnosis not present

## 2024-08-06 DIAGNOSIS — R262 Difficulty in walking, not elsewhere classified: Secondary | ICD-10-CM | POA: Diagnosis present

## 2024-08-06 DIAGNOSIS — R600 Localized edema: Secondary | ICD-10-CM | POA: Diagnosis present

## 2024-08-06 NOTE — Therapy (Signed)
 " OUTPATIENT PHYSICAL THERAPY LOWER EXTREMITY EVALUATION   Patient Name: Dillon [name] MRN: [id number] DOB:[birth year], 68 y.o., male Today's Date: 08/06/2024  END OF SESSION:  PT End of Session - 08/06/24 0815     Visit Number 1    Number of Visits 12    Date for Recertification  10/04/24    Authorization Type [employer]    Authorization Time Period No authorization required    PT Start Time 0814    PT Stop Time 0905    PT Time Calculation (min) 51 min    Activity Tolerance Patient tolerated treatment well    Behavior During Therapy [facility] for tasks assessed/performed          Past Medical History:  Diagnosis Date   Arthritis    GERD (gastroesophageal reflux disease)    Hypertension    Lumbar spondylosis    Past Surgical History:  Procedure Laterality Date   COLONOSCOPY N/A 01/24/2019   Procedure: COLONOSCOPY;  Surgeon: Golda Claudis PENNER, MD;  [facility];  Service: Endoscopy;  Laterality: N/A;  1030   COLONOSCOPY N/A 03/07/2024   Procedure: COLONOSCOPY;  Surgeon: Cinderella Deatrice FALCON, MD;  Location: [facility];  Service: Endoscopy;  Laterality: N/A;  10:00 am, asa 1/2   HAND SURGERY Left    closed reduction left hand   JOINT REPLACEMENT Right    Ankle replacement   KNEE SURGERY Right    meniscetomy   POLYPECTOMY  01/24/2019   Procedure: POLYPECTOMY;  Surgeon: Golda Claudis PENNER, MD;  Location: [facility];  Service: Endoscopy;;  colon   TOTAL HIP ARTHROPLASTY Left 08/24/2021   Procedure: TOTAL HIP ARTHROPLASTY;  Surgeon: Margrette Taft FORBES, MD;  Location: [facility];  Service: Orthopedics;  Laterality: Left;   TOTAL KNEE ARTHROPLASTY Right 07/23/2024   Procedure: ARTHROPLASTY, KNEE, TOTAL;  Surgeon: Margrette Taft FORBES, MD;  Location: [facility];  Service: Orthopedics;  Laterality: Right;   WRIST FRACTURE SURGERY     Patient Active Problem List   Diagnosis Date Noted   S/P total knee replacement, right 07/23/24 08/05/2024   Primary osteoarthritis of right  knee 07/23/2024   Adenomatous polyp of transverse colon 03/07/2024   Essential hypertension 01/03/2024   GERD (gastroesophageal reflux disease) 01/03/2024   Hyperlipidemia 12/20/2022   Annual physical exam 12/16/2021   S/P hip replacement, left 08/24/21 09/02/2021    PCP: Bluford Jacqulyn MATSU, DO   REFERRING PROVIDER: Margrette Taft FORBES, MD   REFERRING DIAG: Primary osteoarthritis of right knee   THERAPY DIAG:  Stiffness of right knee, not elsewhere classified  Difficulty in walking, not elsewhere classified  Localized edema  Rationale for Evaluation and Treatment: Rehabilitation  ONSET DATE: 07/23/24  SUBJECTIVE:   SUBJECTIVE STATEMENT: Patient reports that he had a right knee replacement on 07/23/24. He notes that the knee is not hurting, but the tourniquet pain is very bad. He had home health physical therapy which ended yesterday. He was able to get to 95 degrees of knee flexion yesterday. He uses a SPC for ambulation, but he will use a RW if he walks outside for 15+ minutes. He has been using his CPM machine at home. He has been walking a half block everyday at home.   PERTINENT HISTORY: HTN and OA PAIN:  Are you having pain? No  PRECAUTIONS: None  RED FLAGS: None   WEIGHT BEARING RESTRICTIONS: No  FALLS:  Has patient fallen in last 6 months? 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  DIAGNOSTIC FINDINGS: 07/23/24 right knee  x-ray IMPRESSION: Expected changes post right total knee arthroplasty.  PATIENT SURVEYS:  LEFS  Extreme difficulty/unable (0), Quite a bit of difficulty (1), Moderate difficulty (2), Little difficulty (3), No difficulty (4) Survey date:  08/06/24  Any of your usual work, housework or school activities 2  2. Usual hobbies, recreational or sporting activities 0  3. Getting into/out of the bath 0  4. Walking between rooms 4  5. Putting on socks/shoes 0  6. Squatting  3  7. Lifting an object, like a bag of groceries from the floor 4  8. Performing light activities around your home 4  9. Performing heavy activities around your home 0  10. Getting into/out of a car 3  11. Walking 2 blocks 0  12. Walking 1 mile 0  13. Going up/down 10 stairs (1 flight) 3  14. Standing for 1 hour 1  15.  sitting for 1 hour 4  16. Running on even ground 0  17. Running on uneven ground 0  18. Making sharp turns while running fast 0  19. Hopping  0  20. Rolling over in bed 4  Score total:  29/80     COGNITION: Overall cognitive status: Within functional limits for tasks assessed     SENSATION: Patient reports no numbness or tingling.   EDEMA:  Circumferential: Right tibiofemoral joint line: 48.7 cm Left tibiofemoral joint line: 42.7 cm   PALPATION: TTP: right IT band and TFL   LOWER EXTREMITY ROM:  Active ROM Right eval Left eval  Hip flexion    Hip extension    Hip abduction    Hip adduction    Hip internal rotation    Hip external rotation    Knee flexion 85 PROM: 95 degrees 124  Knee extension 4 0  Ankle dorsiflexion    Ankle plantarflexion    Ankle inversion    Ankle eversion     (Blank rows = not tested)  LOWER EXTREMITY MMT: not tested due to surgical condition  MMT Right eval Left eval  Hip flexion    Hip extension    Hip abduction    Hip adduction    Hip internal rotation    Hip external rotation    Knee flexion    Knee extension    Ankle dorsiflexion    Ankle  plantarflexion    Ankle inversion    Ankle eversion     (Blank rows = not tested)  FUNCTIONAL TESTS:  2 minute walk test: 301 feet with SPC   GAIT: Distance walked: 301 feet Assistive device utilized: Single point cane Level of assistance: Modified independence Comments: decreased stride length, gait speed, and right ankle external rotation  TREATMENT DATE:   08/06/24: PT evaluation and patient education  PATIENT EDUCATION:  Education details: HEP, POC, healing, anatomy, expectations following surgery, prognosis, objective findings, healing, benefits of walking, and goals for physical therapy Person educated: Patient Education method: Explanation Education comprehension: verbalized understanding  HOME EXERCISE PROGRAM:   ASSESSMENT:  CLINICAL IMPRESSION: Patient is a 68 y.o. male who was seen today for physical therapy evaluation and treatment following a right total hip arthroplasty on 07/23/24. He presented with low pain severity and irritability with right knee flexion being the most aggravating to his familiar symptoms. He exhibited increased right knee edema compared to the left. However, he exhibited no other signs or symptoms of a post-operative complication. He was educated on the benefits of walking and healing following a total knee arthroplasty. He reported understanding. Recommend that he continue with skilled physical therapy to address his impairments to return to his prior level of function.    OBJECTIVE IMPAIRMENTS: Abnormal gait, decreased activity tolerance, decreased endurance, decreased mobility, difficulty walking, decreased ROM, decreased strength, hypomobility, increased edema, and pain.   ACTIVITY LIMITATIONS: lifting, standing, squatting, stairs, bed mobility, dressing, and locomotion level  PARTICIPATION LIMITATIONS: driving,  shopping, community activity, occupation, and yard work  PERSONAL FACTORS: Past/current experiences and 1-2 comorbidities: HTN and OA are also affecting patient's functional outcome.   REHAB POTENTIAL: Excellent  CLINICAL DECISION MAKING: Stable/uncomplicated  EVALUATION COMPLEXITY: Low   GOALS: Goals reviewed with patient? Yes  SHORT TERM GOALS: Target date: 08/27/24 Patient will be independent with his initial HEP.  Baseline: Goal status: INITIAL  2.  Patient will be able to demonstrate at least 120 degrees of active right knee flexion for improved function navigating stairs.  Baseline:  Goal status: INITIAL  3.  Patient will improve his LEFS score to at least 50/80 for improved perceived function with his daily activities.  Baseline:  Goal status: INITIAL  4.  Patient will be able to walk at least 200 feet without an assistive device for improved household mobility.  Baseline:  Goal status: INITIAL  LONG TERM GOALS: Target date: 09/17/24  Patient will be independent with his advanced HEP.  Baseline:  Goal status: INITIAL  2.  Patient will be able to navigate at least 4 steps with a reciprocal pattern for improved household mobility.  Baseline:  Goal status: INITIAL  3.  Patient will improve his two minute walk test distance by at least 80 feet for improved community mobility.  Baseline:  Goal status: INITIAL  4.  Patient will be able to squat at lift at least 30 pounds with proper biomechanics to return to his critical job demands.  Baseline:  Goal status: INITIAL  PLAN:  PT FREQUENCY: 2x/week  PT DURATION: 6 weeks  PLANNED INTERVENTIONS: 02835- PT Re-evaluation, 97750- Physical Performance Testing, 97110-Therapeutic exercises, 97530- Therapeutic activity, 97112- Neuromuscular re-education, 97535- Self Care, 02859- Manual therapy, [phone]- Gait training, Patient/Family education, Balance training, Stair training, Joint mobilization, Scar mobilization,  Cryotherapy, and Moist heat  PLAN FOR NEXT SESSION: review and update HEP, lower extremity strengthening, gait training, and balance interventions   Lacinda JAYSON Fass, PT 08/06/2024, 5:01 PM  "

## 2024-08-07 ENCOUNTER — Ambulatory Visit (INDEPENDENT_AMBULATORY_CARE_PROVIDER_SITE_OTHER): Admitting: Orthopedic Surgery

## 2024-08-07 ENCOUNTER — Encounter: Payer: Self-pay | Admitting: Orthopedic Surgery

## 2024-08-07 DIAGNOSIS — Z96651 Presence of right artificial knee joint: Secondary | ICD-10-CM

## 2024-08-07 NOTE — Progress Notes (Signed)
 "   POST OP VISIT   Patient: Dillon [name]           Date of Birth: [birth year]           MRN: [id number] Visit Date: 08/07/2024 Requested by: Cook, Jayce G, DO [address] [place],  [place] [address] PCP: Cook, Jayce G, DO   Encounter Diagnosis  Name Primary?   S/P total knee replacement, right 07/23/24 Yes   PROCEDURE: Right total knee arthroplasty attune fixed-bearing posterior stabilized correction of valgus deformity  Chief Complaint  Patient presents with   Post-op Follow-up    Right knee / TKR 06/23/24    Allergies[1]   Current Medications[2]  PAIN MED: Oxycodone  half in the morning half in the evening [id number] aspirin   Exam findings Swelling of the right knee as expected Incision looks clean dry and intact with mild bleeding from the top No calf pain or tenderness negative Homans' sign Obvious correction of major valgus deformity noted as the leg is much straighter  No peroneal nerve problems at this time    ASSESSMENT AND PLAN:  68 year old male doing well postop  Patient advised to proceed with physical therapy use Tylenol  Celebrex  and oxycodone  for pain control and ice  Okay to walk.  Okay to shower tomorrow.  Return in 2 weeks.  Note the patient's therapy was delayed as there was something arrived with the physical therapy service.  He did miss therapy for several days.  Outpatient therapy indicates he has 95 degree range of motion with 4 degree lack of extension    I have reviewed the patient's history and given the presence of a fragility fracture, I have deemed the necessity of a osteoporosis management referral or confirmed that the patient is currently enrolled in a osteoporosis treatment program.  Per [facility] clinic policy, our goal is ensure optimal postoperative pain control with a multimodal pain management strategy. For all [facility] patients, our goal is to wean post-operative narcotic medications by 6 weeks post-operatively. If this  is not possible due to utilization of pain medication prior to surgery, your [facility] doctor will support your acute post-operative pain control for the first 6 weeks postoperatively, with a plan to transition you back to your primary pain team following that. [name] will work to ensure a therapist, occupational.     [1] No Known Allergies [2]  Current Outpatient Medications:    acetaminophen  (TYLENOL ) 500 MG tablet, Take 2 tablets (1,000 mg total) by mouth every 6 (six) hours., Disp: 30 tablet, Rfl: 0   amLODipine  (NORVASC ) 5 MG tablet, Take 1 tablet (5 mg total) by mouth daily., Disp: 90 tablet, Rfl: 3   aspirin  EC 325 MG tablet, Take 1 tablet (325 mg total) by mouth daily., Disp: 100 tablet, Rfl: 3   celecoxib  (CELEBREX ) 200 MG capsule, Take 1 capsule (200 mg total) by mouth 2 (two) times daily., Disp: 60 capsule, Rfl: 1   Cholecalciferol  (VITAMIN D3) 50 MCG (2000 UT) TABS, Take 2,000 Units by mouth in the morning., Disp: , Rfl:    docusate sodium  (COLACE) 100 MG capsule, Take 1 capsule (100 mg total) by mouth 2 (two) times daily., Disp: 10 capsule, Rfl: 0   famotidine  (PEPCID ) 20 MG tablet, Take 1 tablet (20 mg total) by mouth daily., Disp: 90 tablet, Rfl: 1   Magnesium  250 MG TABS, Take 1 tablet by mouth daily., Disp: , Rfl:    methocarbamol  (ROBAXIN ) 500 MG tablet, Take 1 tablet (500 mg total) by  mouth every 6 (six) hours as needed for muscle spasms., Disp: 60 tablet, Rfl: 1   OVER THE COUNTER MEDICATION, Take 3 each by mouth at bedtime. Hemp gummies, Disp: , Rfl:    oxyCODONE  (OXY IR/ROXICODONE ) 5 MG immediate release tablet, Take 1 tablet (5 mg total) by mouth every 4 (four) hours as needed for moderate pain (pain score 4-6)., Disp: 30 tablet, Rfl: 0   polyethylene glycol (MIRALAX  / GLYCOLAX ) 17 [name] packet, Take 17 [name] by mouth daily., Disp: 14 each, Rfl: 0   Pseudoeph-Doxylamine-DM-APAP (NYQUIL PO), Take 10 mLs by mouth at bedtime as needed., Disp: , Rfl:    RABEprazole  (ACIPHEX ) 20 MG tablet,  Take 1 tablet (20 mg total) by mouth daily., Disp: 90 tablet, Rfl: 1  "

## 2024-08-07 NOTE — Patient Instructions (Signed)
 For pain   Ice  tylenol   Oxycodone    [facility] in 2 weeks

## 2024-08-07 NOTE — Progress Notes (Signed)
" ° ° °  08/07/2024   Chief Complaint  Patient presents with   Post-op Follow-up    Right knee / TKR 06/23/24    Encounter Diagnosis  Name Primary?   S/P total knee replacement, right 07/23/24 Yes    What pharmacy do you use ? _____[facility]______________________  DOI/DOS/ Date: 07/23/24  Did you get better, worse or no change (Answer below)   Improved      "

## 2024-08-13 ENCOUNTER — Ambulatory Visit (HOSPITAL_COMMUNITY): Attending: Orthopedic Surgery | Admitting: Physical Therapy

## 2024-08-13 DIAGNOSIS — R262 Difficulty in walking, not elsewhere classified: Secondary | ICD-10-CM | POA: Insufficient documentation

## 2024-08-13 DIAGNOSIS — R600 Localized edema: Secondary | ICD-10-CM | POA: Diagnosis present

## 2024-08-13 DIAGNOSIS — M25661 Stiffness of right knee, not elsewhere classified: Secondary | ICD-10-CM | POA: Diagnosis present

## 2024-08-13 NOTE — Therapy (Signed)
 " OUTPATIENT PHYSICAL THERAPY LOWER EXTREMITY TREATMENT  Patient Name: Dillon [name] MRN: [id number] DOB:1956/01/24, 69 y.o., male Today's Date: 08/13/2024  END OF SESSION:  PT End of Session - 08/13/24 0911     Visit Number 2    Number of Visits 12    Date for Recertification  10/04/24    Authorization Type [employer]    Authorization Time Period No authorization required    PT Start Time 0904    PT Stop Time 0945    PT Time Calculation (min) 41 min    Activity Tolerance Patient tolerated treatment well    Behavior During Therapy [facility] for tasks assessed/performed           Past Medical History:  Diagnosis Date   Arthritis    GERD (gastroesophageal reflux disease)    Hypertension    Lumbar spondylosis    Past Surgical History:  Procedure Laterality Date   COLONOSCOPY N/A 01/24/2019   Procedure: COLONOSCOPY;  Surgeon: Golda Claudis PENNER, MD;  [facility];  Service: Endoscopy;  Laterality: N/A;  1030   COLONOSCOPY N/A 03/07/2024   Procedure: COLONOSCOPY;  Surgeon: Cinderella Deatrice FALCON, MD;  Location: [facility];  Service: Endoscopy;  Laterality: N/A;  10:00 am, asa 1/2   HAND SURGERY Left    closed reduction left hand   JOINT REPLACEMENT Right    Ankle replacement   KNEE SURGERY Right    meniscetomy   POLYPECTOMY  01/24/2019   Procedure: POLYPECTOMY;  Surgeon: Golda Claudis PENNER, MD;  [facility];  Service: Endoscopy;;  colon   TOTAL HIP ARTHROPLASTY Left 08/24/2021   Procedure: TOTAL HIP ARTHROPLASTY;  Surgeon: Margrette Taft FORBES, MD;  Location: [facility];  Service: Orthopedics;  Laterality: Left;   TOTAL KNEE ARTHROPLASTY Right 07/23/2024   Procedure: ARTHROPLASTY, KNEE, TOTAL;  Surgeon: Margrette Taft FORBES, MD;  Location: [facility];  Service: Orthopedics;  Laterality: Right;   WRIST FRACTURE SURGERY     Patient Active Problem List   Diagnosis Date Noted   S/P total knee replacement, right 07/23/24 08/05/2024   Primary osteoarthritis of right  knee 07/23/2024   Adenomatous polyp of transverse colon 03/07/2024   Essential hypertension 01/03/2024   GERD (gastroesophageal reflux disease) 01/03/2024   Hyperlipidemia 12/20/2022   Annual physical exam 12/16/2021   S/P hip replacement, left 08/24/21 09/02/2021    PCP: Bluford Jacqulyn MATSU, DO   REFERRING PROVIDER: Margrette Taft FORBES, MD   REFERRING DIAG: Primary osteoarthritis of right knee   THERAPY DIAG:  Stiffness of right knee, not elsewhere classified  Difficulty in walking, not elsewhere classified  Localized edema  Rationale for Evaluation and Treatment: Rehabilitation  ONSET DATE: 07/23/24  SUBJECTIVE:   SUBJECTIVE STATEMENT: Pt reports swelling, tightness in knee and thigh otherwise doing well.  Compliance with HEP reported.    Evaluation:  Patient reports that he had a right knee replacement on 07/23/24. He notes that the knee is not hurting, but the tourniquet pain is very bad. He had home health physical therapy which ended yesterday. He was able to get to 95 degrees of knee flexion yesterday. He uses a SPC for ambulation, but he will use a RW if he walks outside for 15+ minutes. He has been using his CPM machine at home. He has been walking a half block everyday at home.   PERTINENT HISTORY: HTN and OA PAIN:  Are you having pain? No  PRECAUTIONS: None  RED FLAGS: None   WEIGHT  BEARING RESTRICTIONS: No  FALLS:  Has patient fallen in last 6 months? 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  DIAGNOSTIC FINDINGS: 07/23/24 right knee x-ray IMPRESSION: Expected changes post right total knee arthroplasty.  PATIENT SURVEYS:  LEFS  Extreme difficulty/unable (0), Quite a bit of difficulty (1), Moderate difficulty (2), Little difficulty (3), No difficulty (4) Survey date:  08/06/24  Any of your usual work, housework or school activities 2  2. Usual hobbies, recreational or sporting activities 0  3. Getting into/out of the bath 0  4. Walking between rooms 4  5. Putting on socks/shoes 0  6. Squatting  3  7. Lifting an object, like a bag of groceries from the floor 4  8. Performing light activities around your home 4  9. Performing heavy activities around your home 0  10. Getting into/out of a car 3  11. Walking 2 blocks 0  12. Walking 1 mile 0  13. Going up/down 10 stairs (1 flight) 3  14. Standing for 1 hour 1  15.  sitting for 1 hour 4  16. Running on even ground 0  17. Running on uneven ground 0  18. Making sharp turns while running fast 0  19. Hopping  0  20. Rolling over in bed 4  Score total:  29/80     COGNITION: Overall cognitive status: Within functional limits for tasks assessed     SENSATION: Patient reports no numbness or tingling.   EDEMA:  Circumferential: Right tibiofemoral joint line: 48.7 cm Left tibiofemoral joint line: 42.7 cm   PALPATION: TTP: right IT band and TFL   LOWER EXTREMITY ROM:  Active ROM Right eval Left eval  Hip flexion    Hip extension    Hip abduction    Hip adduction    Hip internal rotation    Hip external rotation    Knee flexion 85 PROM: 95 degrees 124  Knee extension 4 0  Ankle dorsiflexion    Ankle plantarflexion    Ankle inversion    Ankle eversion     (Blank rows = not tested)  LOWER EXTREMITY MMT: not tested due to surgical condition  MMT Right eval Left eval  Hip flexion    Hip extension    Hip abduction    Hip adduction    Hip internal  rotation    Hip external rotation    Knee flexion    Knee extension    Ankle dorsiflexion    Ankle plantarflexion    Ankle inversion    Ankle eversion     (Blank rows = not tested)  FUNCTIONAL TESTS:  2 minute walk test: 301 feet with SPC   GAIT: Distance walked: 301 feet Assistive device utilized: Single point cane Level of assistance: Modified independence Comments: decreased stride length, gait speed, and right ankle external rotation  TREATMENT DATE:   08/14/23 Bike seat rocking 5 minutes Standing:  heelraises 20X  Rt knee flexion stretch 10X10 on 12 step  Rt knee flexion 10X  Slant board stretch 5X Supine:  quad sets 10X5  Heelslides Rt 5X  AROM 95 degrees after manual Manual with elevation:  retro massage, decongestive techniques, scar massage/adhesions knee and thigh  08/06/24: PT evaluation and patient education  PATIENT EDUCATION:  Education details: HEP, POC, healing, anatomy, expectations following surgery, prognosis, objective findings, healing, benefits of walking, and goals for physical therapy Person educated: Patient Education method: Explanation Education comprehension: verbalized understanding  HOME EXERCISE PROGRAM:   ASSESSMENT:  CLINICAL IMPRESSION: Educated on compression stockings and suggested he wear thigh high vs knee high (currently wearing bil knee highs) and explained how the fluid is being pushed to the knee and settling there.  Pt verbalized understanding, actually stating his MD told him he didn't have to wear the stockings at all.   Pt with good amount of edema in knee and up into thigh.  Indurated lateral thigh and bruising and most scar tissue located at proximal end of scar.   Manual completed to help reduce this and also worked on the tightness/adhesions resulting in vast improvement following.  Suggested he  have his wife do this at home as well. AROM improved today to 95 following session.  Pt will continue to benefit from skilled physical therapy to address his impairments to return to his prior level of function.    OBJECTIVE IMPAIRMENTS: Abnormal gait, decreased activity tolerance, decreased endurance, decreased mobility, difficulty walking, decreased ROM, decreased strength, hypomobility, increased edema, and pain.   ACTIVITY LIMITATIONS: lifting, standing, squatting, stairs, bed mobility, dressing, and locomotion level  PARTICIPATION LIMITATIONS: driving, shopping, community activity, occupation, and yard work  PERSONAL FACTORS: Past/current experiences and 1-2 comorbidities: HTN and OA are also affecting patient's functional outcome.   REHAB POTENTIAL: Excellent  CLINICAL DECISION MAKING: Stable/uncomplicated  EVALUATION COMPLEXITY: Low   GOALS: Goals reviewed with patient? Yes  SHORT TERM GOALS: Target date: 08/27/24 Patient will be independent with his initial HEP.  Baseline: Goal status: INITIAL  2.  Patient will be able to demonstrate at least 120 degrees of active right knee flexion for improved function navigating stairs.  Baseline:  Goal status: INITIAL  3.  Patient will improve his LEFS score to at least 50/80 for improved perceived function with his daily activities.  Baseline:  Goal status: INITIAL  4.  Patient will be able to walk at least 200 feet without an assistive device for improved household mobility.  Baseline:  Goal status: INITIAL  LONG TERM GOALS: Target date: 09/17/24  Patient will be independent with his advanced HEP.  Baseline:  Goal status: INITIAL  2.  Patient will be able to navigate at least 4 steps with a reciprocal pattern for improved household mobility.  Baseline:  Goal status: INITIAL  3.  Patient will improve his two minute walk test distance by at least 80 feet for improved community mobility.  Baseline:  Goal status: INITIAL  4.   Patient will be able to squat at lift at least 30 pounds with proper biomechanics to return to his critical job demands.  Baseline:  Goal status: INITIAL  PLAN:  PT FREQUENCY: 2x/week  PT DURATION: 6 weeks  PLANNED INTERVENTIONS: 97164- PT Re-evaluation, 97750- Physical Performance Testing, 97110-Therapeutic exercises, 97530- Therapeutic activity, [id number]- Neuromuscular re-education, 97535- Self Care, [id number]- Manual therapy, [phone]- Gait training, Patient/Family education, Balance training, Stair training, Joint  mobilization, Scar mobilization, Cryotherapy, and Moist heat  PLAN FOR NEXT SESSION: Update HEP as needed, lower extremity strengthening, gait training, and balance interventions. Follow up with edema and continue manual as needed.  Greig KATHEE Fuse, PTA/CLT [facility] Ph: [phone]  Fuse Greig KATHEE, PTA 08/13/2024, 9:50 AM  "

## 2024-08-15 ENCOUNTER — Ambulatory Visit (HOSPITAL_COMMUNITY)

## 2024-08-15 ENCOUNTER — Encounter (HOSPITAL_COMMUNITY): Payer: Self-pay

## 2024-08-15 DIAGNOSIS — M25661 Stiffness of right knee, not elsewhere classified: Secondary | ICD-10-CM | POA: Diagnosis not present

## 2024-08-15 DIAGNOSIS — R600 Localized edema: Secondary | ICD-10-CM

## 2024-08-15 DIAGNOSIS — R262 Difficulty in walking, not elsewhere classified: Secondary | ICD-10-CM

## 2024-08-15 NOTE — Therapy (Signed)
 " OUTPATIENT PHYSICAL THERAPY LOWER EXTREMITY TREATMENT  Patient Name: Dillon [name] MRN: [id number] DOB:10/20/1955, 69 y.o., male Today's Date: 08/15/2024  END OF SESSION:  PT End of Session - 08/15/24 1459     Visit Number 3    Number of Visits 12    Date for Recertification  10/04/24    Authorization Type [employer]    Authorization Time Period No authorization required    PT Start Time 1500    PT Stop Time 1540    PT Time Calculation (min) 40 min    Activity Tolerance Patient tolerated treatment well    Behavior During Therapy WFL for tasks assessed/performed            Past Medical History:  Diagnosis Date   Arthritis    GERD (gastroesophageal reflux disease)    Hypertension    Lumbar spondylosis    Past Surgical History:  Procedure Laterality Date   COLONOSCOPY N/A 01/24/2019   Procedure: COLONOSCOPY;  Surgeon: Golda Claudis PENNER, MD;  [facility];  Service: Endoscopy;  Laterality: N/A;  1030   COLONOSCOPY N/A 03/07/2024   Procedure: COLONOSCOPY;  Surgeon: Cinderella Deatrice FALCON, MD;  [facility];  Service: Endoscopy;  Laterality: N/A;  10:00 am, asa 1/2   HAND SURGERY Left    closed reduction left hand   JOINT REPLACEMENT Right    Ankle replacement   KNEE SURGERY Right    meniscetomy   POLYPECTOMY  01/24/2019   Procedure: POLYPECTOMY;  Surgeon: Golda Claudis PENNER, MD;  [facility];  Service: Endoscopy;;  colon   TOTAL HIP ARTHROPLASTY Left 08/24/2021   Procedure: TOTAL HIP ARTHROPLASTY;  Surgeon: Margrette Taft FORBES, MD;  Location: [facility];  Service: Orthopedics;  Laterality: Left;   TOTAL KNEE ARTHROPLASTY Right 07/23/2024   Procedure: ARTHROPLASTY, KNEE, TOTAL;  Surgeon: Margrette Taft FORBES, MD;  Location: [facility];  Service: Orthopedics;  Laterality: Right;   WRIST FRACTURE SURGERY     Patient Active Problem List   Diagnosis Date Noted   S/P total knee replacement, right 07/23/24 08/05/2024   Primary osteoarthritis of right  knee 07/23/2024   Adenomatous polyp of transverse colon 03/07/2024   Essential hypertension 01/03/2024   GERD (gastroesophageal reflux disease) 01/03/2024   Hyperlipidemia 12/20/2022   Annual physical exam 12/16/2021   S/P hip replacement, left 08/24/21 09/02/2021    PCP: Bluford Jacqulyn MATSU, DO   REFERRING PROVIDER: Margrette Taft FORBES, MD   REFERRING DIAG: Primary osteoarthritis of right knee   THERAPY DIAG:  Stiffness of right knee, not elsewhere classified  Difficulty in walking, not elsewhere classified  Localized edema  Rationale for Evaluation and Treatment: Rehabilitation  ONSET DATE: 07/23/24  SUBJECTIVE:   SUBJECTIVE STATEMENT: Patient reports that he feels good today. He was able to walk a block without any problems.   Evaluation:  Patient reports that he had a right knee replacement on 07/23/24. He notes that the knee is not hurting, but the tourniquet pain is very bad. He had home health physical therapy which ended yesterday. He was able to get to 95 degrees of knee flexion yesterday. He uses a SPC for ambulation, but he will use a RW if he walks outside for 15+ minutes. He has been using his CPM machine at home. He has been walking a half block everyday at home.   PERTINENT HISTORY: HTN and OA PAIN:  Are you having pain? No  PRECAUTIONS: None  RED FLAGS: None  WEIGHT BEARING RESTRICTIONS: No  FALLS:  Has patient fallen in last 6 months? 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  DIAGNOSTIC FINDINGS: 07/23/24 right knee x-ray IMPRESSION: Expected changes post right total knee arthroplasty.  PATIENT SURVEYS:  LEFS  Extreme difficulty/unable (0), Quite a bit of difficulty (1), Moderate difficulty (2), Little difficulty (3), No difficulty (4) Survey date:  08/06/24  Any of your usual work, housework or school activities 2  2. Usual hobbies, recreational or sporting activities 0  3. Getting into/out of the bath 0  4. Walking between rooms 4  5. Putting on socks/shoes 0  6. Squatting  3  7. Lifting an object, like a bag of groceries from the floor 4  8. Performing light activities around your home 4  9. Performing heavy activities around your home 0  10. Getting into/out of a car 3  11. Walking 2 blocks 0  12. Walking 1 mile 0  13. Going up/down 10 stairs (1 flight) 3  14. Standing for 1 hour 1  15.  sitting for 1 hour 4  16. Running on even ground 0  17. Running on uneven ground 0  18. Making sharp turns while running fast 0  19. Hopping  0  20. Rolling over in bed 4  Score total:  29/80     COGNITION: Overall cognitive status: Within functional limits for tasks assessed     SENSATION: Patient reports no numbness or tingling.   EDEMA:  Circumferential: Right tibiofemoral joint line: 48.7 cm Left tibiofemoral joint line: 42.7 cm   PALPATION: TTP: right IT band and TFL   LOWER EXTREMITY ROM:  Active ROM Right eval Left eval  Hip flexion    Hip extension    Hip abduction    Hip adduction    Hip internal rotation    Hip external rotation    Knee flexion 85 PROM: 95 degrees 124  Knee extension 4 0  Ankle dorsiflexion    Ankle plantarflexion    Ankle inversion    Ankle eversion     (Blank rows = not tested)  LOWER EXTREMITY MMT: not tested due to surgical condition  MMT Right eval Left eval  Hip flexion    Hip extension    Hip abduction    Hip adduction    Hip internal rotation     Hip external rotation    Knee flexion    Knee extension    Ankle dorsiflexion    Ankle plantarflexion    Ankle inversion    Ankle eversion     (Blank rows = not tested)  FUNCTIONAL TESTS:  2 minute walk test: 301 feet with SPC   GAIT: Distance walked: 301 feet Assistive device utilized: Single point cane Level of assistance: Modified independence Comments: decreased stride length, gait speed, and right ankle external rotation  TREATMENT DATE:                                     08/15/24 EXERCISE LOG  Exercise Repetitions and Resistance Comments  Recumbent bike  L1 x 8 minutes @ seat 19    Lunges onto step  12 step x 2 minutes    Thomas stretch  4 x 30 seconds  RLE only   Manual therapy   STM to right IT band and quadriceps PROM with focus on knee flexion  Max 100 degrees   Stairs  1 set low steps (7 steps) 1 set standard steps (4 steps)  With reciprocal pattern  Lateral step ups  6 step x 10 reps    Step up  6 step x 10 reps  RLE leading   Blank cell = exercise not performed today   08/14/23 Bike seat rocking 5 minutes Standing:  heelraises 20X  Rt knee flexion stretch 10X10 on 12 step  Rt knee flexion 10X  Slant board stretch 5X Supine:  quad sets 10X5  Heelslides Rt 5X  AROM 95 degrees after manual Manual with elevation:  retro massage, decongestive techniques, scar massage/adhesions knee and thigh  PATIENT EDUCATION:  Education details: HEP, POC, healing, anatomy, expectations following surgery, prognosis, objective findings, healing, benefits of walking, and goals for physical therapy Person educated: Patient Education method: Explanation Education comprehension: verbalized understanding  HOME EXERCISE PROGRAM:   ASSESSMENT:  CLINICAL IMPRESSION: Patient was progressed with multiple new interventions for improved right knee  flexion. He required minimal cueing with today's new exercises for proper positioning. Manual therapy focused on improved knee flexion through the use of soft tissue mobilization and passive range of motion with overpressure. This was followed by appropriately matched interventions for improved knee flexion with good results. He was able to achieve 100 degrees of passive right knee flexion. He reported that his knee felt better upon the conclusion of treatment. Patient continues to require skilled physical therapy to address her remaining impairments to return to her prior level of function.   OBJECTIVE IMPAIRMENTS: Abnormal gait, decreased activity tolerance, decreased endurance, decreased mobility, difficulty walking, decreased ROM, decreased strength, hypomobility, increased edema, and pain.   ACTIVITY LIMITATIONS: lifting, standing, squatting, stairs, bed mobility, dressing, and locomotion level  PARTICIPATION LIMITATIONS: driving, shopping, community activity, occupation, and yard work  PERSONAL FACTORS: Past/current experiences and 1-2 comorbidities: HTN and OA are also affecting patient's functional outcome.   REHAB POTENTIAL: Excellent  CLINICAL DECISION MAKING: Stable/uncomplicated  EVALUATION COMPLEXITY: Low   GOALS: Goals reviewed with patient? Yes  SHORT TERM GOALS: Target date: 08/27/24 Patient will be independent with his initial HEP.  Baseline: Goal status: INITIAL  2.  Patient will be able to demonstrate at least 120 degrees of active right knee flexion for improved function navigating stairs.  Baseline:  Goal status: INITIAL  3.  Patient will improve his LEFS score to at least 50/80 for improved perceived function with his daily activities.  Baseline:  Goal status: INITIAL  4.  Patient will be able to walk at least 200 feet without an assistive device for improved household mobility.  Baseline:  Goal status: INITIAL  LONG TERM GOALS: Target date:  09/17/24  Patient will be independent with his advanced HEP.  Baseline:  Goal status: INITIAL  2.  Patient will be able to navigate at least 4 steps with a reciprocal pattern for improved household  mobility.  Baseline:  Goal status: INITIAL  3.  Patient will improve his two minute walk test distance by at least 80 feet for improved community mobility.  Baseline:  Goal status: INITIAL  4.  Patient will be able to squat at lift at least 30 pounds with proper biomechanics to return to his critical job demands.  Baseline:  Goal status: INITIAL  PLAN:  PT FREQUENCY: 2x/week  PT DURATION: 6 weeks  PLANNED INTERVENTIONS: 97164- PT Re-evaluation, 97750- Physical Performance Testing, 97110-Therapeutic exercises, 97530- Therapeutic activity, 97112- Neuromuscular re-education, 97535- Self Care, 02859- Manual therapy, [phone]- Gait training, Patient/Family education, Balance training, Stair training, Joint mobilization, Scar mobilization, Cryotherapy, and Moist heat  PLAN FOR NEXT SESSION: Update HEP as needed, lower extremity strengthening, gait training, and balance interventions. Follow up with edema and continue manual as needed.   Lacinda JAYSON Fass, PT 08/15/2024, 5:33 PM  "

## 2024-08-16 ENCOUNTER — Telehealth: Payer: Self-pay | Admitting: Orthopedic Surgery

## 2024-08-16 NOTE — Telephone Encounter (Signed)
 Received vm from pt. IC he has FMLA forms. I advised for him to bring to [facility] and they will give him auth and accept form payment. He expressed understanding. [phone]

## 2024-08-19 ENCOUNTER — Encounter (HOSPITAL_COMMUNITY): Payer: Self-pay

## 2024-08-19 ENCOUNTER — Ambulatory Visit (HOSPITAL_COMMUNITY)

## 2024-08-19 DIAGNOSIS — M25661 Stiffness of right knee, not elsewhere classified: Secondary | ICD-10-CM

## 2024-08-19 DIAGNOSIS — R600 Localized edema: Secondary | ICD-10-CM

## 2024-08-19 DIAGNOSIS — R262 Difficulty in walking, not elsewhere classified: Secondary | ICD-10-CM

## 2024-08-19 NOTE — Therapy (Cosign Needed Addendum)
 " OUTPATIENT PHYSICAL THERAPY LOWER EXTREMITY TREATMENT  Patient Name: Dillon [name] MRN: [id number] DOB:1955/12/13, 69 y.o., male Today's Date: 08/19/2024  END OF SESSION:  PT End of Session - 08/19/24 1458     Visit Number 4    Number of Visits 12    Date for Recertification  10/04/24    Authorization Type [employer]    Authorization Time Period No authorization required    PT Start Time 1459    PT Stop Time 1542    PT Time Calculation (min) 43 min    Activity Tolerance Patient tolerated treatment well    Behavior During Therapy WFL for tasks assessed/performed             Past Medical History:  Diagnosis Date   Arthritis    GERD (gastroesophageal reflux disease)    Hypertension    Lumbar spondylosis    Past Surgical History:  Procedure Laterality Date   COLONOSCOPY N/A 01/24/2019   Procedure: COLONOSCOPY;  Surgeon: Golda Claudis PENNER, MD;  Location: [facility];  Service: Endoscopy;  Laterality: N/A;  1030   COLONOSCOPY N/A 03/07/2024   Procedure: COLONOSCOPY;  Surgeon: Cinderella Deatrice FALCON, MD;  Location: [facility];  Service: Endoscopy;  Laterality: N/A;  10:00 am, asa 1/2   HAND SURGERY Left    closed reduction left hand   JOINT REPLACEMENT Right    Ankle replacement   KNEE SURGERY Right    meniscetomy   POLYPECTOMY  01/24/2019   Procedure: POLYPECTOMY;  Surgeon: Golda Claudis PENNER, MD;  Location: [facility];  Service: Endoscopy;;  colon   TOTAL HIP ARTHROPLASTY Left 08/24/2021   Procedure: TOTAL HIP ARTHROPLASTY;  Surgeon: Margrette Taft FORBES, MD;  Location: [facility];  Service: Orthopedics;  Laterality: Left;   TOTAL KNEE ARTHROPLASTY Right 07/23/2024   Procedure: ARTHROPLASTY, KNEE, TOTAL;  Surgeon: Margrette Taft FORBES, MD;  Location: [facility];  Service: Orthopedics;  Laterality: Right;   WRIST FRACTURE SURGERY     Patient Active Problem List   Diagnosis Date Noted   S/P total knee replacement, right 07/23/24 08/05/2024   Primary osteoarthritis of  right knee 07/23/2024   Adenomatous polyp of transverse colon 03/07/2024   Essential hypertension 01/03/2024   GERD (gastroesophageal reflux disease) 01/03/2024   Hyperlipidemia 12/20/2022   Annual physical exam 12/16/2021   S/P hip replacement, left 08/24/21 09/02/2021    PCP: Bluford Jacqulyn MATSU, DO   REFERRING PROVIDER: Margrette Taft FORBES, MD   REFERRING DIAG: Primary osteoarthritis of right knee   THERAPY DIAG:  Stiffness of right knee, not elsewhere classified  Difficulty in walking, not elsewhere classified  Localized edema  Rationale for Evaluation and Treatment: Rehabilitation  ONSET DATE: 07/23/24  SUBJECTIVE:   SUBJECTIVE STATEMENT: Patient reports feeling no pain today, just tightness on the anterior knee and stiffness from being in the car for a long time. He reports not using his cane and feeling comfortable ambulating without it.  Evaluation:  Patient reports that he had a right knee replacement on 07/23/24. He notes that the knee is not hurting, but the tourniquet pain is very bad. He had home health physical therapy which ended yesterday. He was able to get to 95 degrees of knee flexion yesterday. He uses a SPC for ambulation, but he will use a RW if he walks outside for 15+ minutes. He has been using his CPM machine at home. He has been walking a half block everyday at home.   PERTINENT HISTORY: HTN  and OA PAIN:  Are you having pain? No  PRECAUTIONS: None  RED FLAGS: None   WEIGHT BEARING RESTRICTIONS: No  FALLS:  Has patient fallen in last 6 months? 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  DIAGNOSTIC FINDINGS: 07/23/24 right knee x-ray IMPRESSION: Expected changes post right total knee arthroplasty.  PATIENT SURVEYS:  LEFS  Extreme difficulty/unable (0), Quite a bit of difficulty (1), Moderate difficulty (2), Little difficulty (3), No difficulty (4) Survey date:  08/06/24  Any of your usual work, housework or school activities 2  2. Usual hobbies, recreational or sporting activities 0  3. Getting into/out of the bath 0  4. Walking between rooms 4  5. Putting on socks/shoes 0  6. Squatting  3  7. Lifting an object, like a bag of groceries from the floor 4  8. Performing light activities around your home 4  9. Performing heavy activities around your home 0  10. Getting into/out of a car 3  11. Walking 2 blocks 0  12. Walking 1 mile 0  13. Going up/down 10 stairs (1 flight) 3  14. Standing for 1 hour 1  15.  sitting for 1 hour 4  16. Running on even ground 0  17. Running on uneven ground 0  18. Making sharp turns while running fast 0  19. Hopping  0  20. Rolling over in bed 4  Score total:  29/80     COGNITION: Overall cognitive status: Within functional limits for tasks assessed     SENSATION: Patient reports no numbness or tingling.   EDEMA:  Circumferential: Right tibiofemoral joint line: 48.7 cm Left tibiofemoral joint line: 42.7 cm   PALPATION: TTP: right IT band and TFL   LOWER EXTREMITY ROM:  Active ROM Right eval Left eval  Hip flexion    Hip extension    Hip abduction    Hip adduction    Hip internal rotation    Hip external rotation    Knee flexion 85 PROM: 95 degrees 124  Knee extension 4 0  Ankle dorsiflexion    Ankle plantarflexion    Ankle inversion    Ankle eversion     (Blank rows = not tested)  LOWER EXTREMITY MMT: not tested due to surgical condition  MMT  Right eval Left eval  Hip flexion    Hip extension    Hip abduction    Hip adduction    Hip internal rotation    Hip external rotation    Knee flexion    Knee extension    Ankle dorsiflexion    Ankle plantarflexion    Ankle inversion    Ankle eversion     (Blank rows = not tested)  FUNCTIONAL TESTS:  2 minute walk test: 301 feet with SPC   GAIT: Distance walked: 301 feet Assistive device utilized: Single point cane Level of assistance: Modified independence Comments: decreased stride length, gait speed, and right ankle external rotation  TREATMENT DATE:                                      08/19/24 EXERCISE LOG  Exercise Repetitions and Resistance Comments  Recumbent bike  L1 x 5 minutes @ seat 15   Lunge stretch  2 minutes w/ 5 second hold x 6 inch step   Hamstring stretch 2 minutes w/ 5 second hold   LAQ 15 reps x 5#   Marches  20 reps each side x 5 # Bilateral UE support with parallel bars  Step ups 15 each side x 6 inch step Bilateral UE support  with parallel bars  Eccentric step downs  15 each side x 6 inch step Bilateral UE support with parallel bars  Tandem stance on foam  2 x 30 seconds Intermittent Bilateral UE support. Moderate loss of balance, more significant with left foot in front.  Clock taps on foam 10 reps each side Unable to complete with no UE support. Completed with unilateral UE support with parallel bars  Hamstring curls 6 plates x 15 reps Unilateral   Manual therapy  STM to quads and lateral thigh. Trigger points in quads and IT band. PROM knee flexion, max 102 degrees.   Blank cell = exercise not performed today                                    08/15/24 EXERCISE LOG  Exercise Repetitions and Resistance Comments  Recumbent bike  L1 x 8 minutes @ seat 19    Lunges onto step  12 step x 2 minutes    Thomas stretch  4 x  30 seconds  RLE only   Manual therapy   STM to right IT band and quadriceps PROM with focus on knee flexion  Max 100 degrees   Stairs  1 set low steps (7 steps) 1 set standard steps (4 steps)  With reciprocal pattern  Lateral step ups  6 step x 10 reps    Step up  6 step x 10 reps  RLE leading   Blank cell = exercise not performed today   08/14/23 Bike seat rocking 5 minutes Standing:  heelraises 20X  Rt knee flexion stretch 10X10 on 12 step  Rt knee flexion 10X  Slant board stretch 5X Supine:  quad sets 10X5  Heelslides Rt 5X  AROM 95 degrees after manual Manual with elevation:  retro massage, decongestive techniques, scar massage/adhesions knee and thigh  PATIENT EDUCATION:  Education details: HEP, POC, healing, anatomy, expectations following surgery, prognosis, objective findings, healing, benefits of walking, and goals for physical therapy Person educated: Patient Education method: Explanation Education comprehension: verbalized understanding  HOME EXERCISE PROGRAM:   ASSESSMENT:  CLINICAL IMPRESSION: Pt continued to progress strength and mobility exercises, as well as beginning balance interventions. He responded well to progressed strength exercises and required minimal cueing to demonstrate proper form. He required moderate, intermittent support from parallel bars during  tandem stance. He demonstrated difficulty and required unilateral support to perform single leg stance during clock taps. STM performed to address trigger points and tightness in quads and IT band. PROM measured 102 degrees following manual therapy. He experienced a mild increase in right knee edema upon the conclusion of today's interventions. Pt reports feeling no pain or soreness at the end of the session today.  Pt will continue to benefit from skilled physical therapy to address mobility and balance deficits to improve functional limitations.  Patient was progressed with multiple new interventions  for improved right knee flexion. He required minimal cueing with today's new exercises for proper positioning. Manual therapy focused on improved knee flexion through the use of soft tissue mobilization and passive range of motion with overpressure. This was followed by appropriately matched interventions for improved knee flexion with good results. He was able to achieve 100 degrees of passive right knee flexion. He reported that his knee felt better upon the conclusion of treatment. Patient continues to require skilled physical therapy to address her remaining impairments to return to her prior level of function.   OBJECTIVE IMPAIRMENTS: Abnormal gait, decreased activity tolerance, decreased endurance, decreased mobility, difficulty walking, decreased ROM, decreased strength, hypomobility, increased edema, and pain.   ACTIVITY LIMITATIONS: lifting, standing, squatting, stairs, bed mobility, dressing, and locomotion level  PARTICIPATION LIMITATIONS: driving, shopping, community activity, occupation, and yard work  PERSONAL FACTORS: Past/current experiences and 1-2 comorbidities: HTN and OA are also affecting patient's functional outcome.   REHAB POTENTIAL: Excellent  CLINICAL DECISION MAKING: Stable/uncomplicated  EVALUATION COMPLEXITY: Low   GOALS: Goals reviewed with patient? Yes  SHORT TERM GOALS: Target date: 08/27/24 Patient will be independent with his initial HEP.  Baseline: Goal status: INITIAL  2.  Patient will be able to demonstrate at least 120 degrees of active right knee flexion for improved function navigating stairs.  Baseline:  Goal status: INITIAL  3.  Patient will improve his LEFS score to at least 50/80 for improved perceived function with his daily activities.  Baseline:  Goal status: INITIAL  4.  Patient will be able to walk at least 200 feet without an assistive device for improved household mobility.  Baseline:  Goal status: INITIAL  LONG TERM GOALS: Target  date: 09/17/24  Patient will be independent with his advanced HEP.  Baseline:  Goal status: INITIAL  2.  Patient will be able to navigate at least 4 steps with a reciprocal pattern for improved household mobility.  Baseline:  Goal status: INITIAL  3.  Patient will improve his two minute walk test distance by at least 80 feet for improved community mobility.  Baseline:  Goal status: INITIAL  4.  Patient will be able to squat at lift at least 30 pounds with proper biomechanics to return to his critical job demands.  Baseline:  Goal status: INITIAL  PLAN:  PT FREQUENCY: 2x/week  PT DURATION: 6 weeks  PLANNED INTERVENTIONS: 97164- PT Re-evaluation, 97750- Physical Performance Testing, 97110-Therapeutic exercises, 97530- Therapeutic activity, [id number]- Neuromuscular re-education, 97535- Self Care, [id number]- Manual therapy, [phone]- Gait training, Patient/Family education, Balance training, Stair training, Joint mobilization, Scar mobilization, Cryotherapy, and Moist heat  PLAN FOR NEXT SESSION: Progress LE strength and mobility exercises focusing on knee flexion. Progress balance interventions to improve functional limitations. Continue manual therapy as needed.   Venus Galeazzi, Student-PT 08/19/2024, 4:38 PM   Today's treatment was completed with direct 1:1 supervision by Lacinda Fass, PT, DPT  "

## 2024-08-21 ENCOUNTER — Ambulatory Visit: Admitting: Orthopedic Surgery

## 2024-08-21 ENCOUNTER — Encounter: Payer: Self-pay | Admitting: Orthopedic Surgery

## 2024-08-21 DIAGNOSIS — Z96651 Presence of right artificial knee joint: Secondary | ICD-10-CM

## 2024-08-21 NOTE — Progress Notes (Signed)
" ° ° °  08/21/2024   Chief Complaint  Patient presents with   Post-op Follow-up    Right TKR 07/23/24    Encounter Diagnosis  Name Primary?   S/P total knee replacement, right 07/23/24 Yes    What pharmacy do you use ? _____[facility]______________________  DOI/DOS/ Date: 07/23/24  Did you get better, worse or no change (Answer below)   Improved but has swelling       "

## 2024-08-21 NOTE — Patient Instructions (Signed)
 RTW 3 days a week   And then on Feb 16 th full time

## 2024-08-21 NOTE — Progress Notes (Signed)
" ° °  Office Visit Note   Patient: Dillon [name]           Date of Birth: 10-06-1955           MRN: [id number] Visit Date: 08/21/2024 Requested by: Cook, Jayce G, DO [address] [place],  [place] [address] PCP: Cook, Jayce G, DO   Assessment & Plan:   Encounter Diagnosis  Name Primary?   S/P total knee replacement, right 07/23/24 Yes    No orders of the defined types were placed in this encounter.   Arran continues to do well.  His only limitation now is the swelling in the knee.  He should continue physical therapy and work on range of motion and control of the swelling  Return in 1 month to check range of motion   Subjective: Chief Complaint  Patient presents with   Post-op Follow-up    Right TKR 07/23/24   Visit Diagnoses:  1. S/P total knee replacement, right 07/23/24    No pain at this time  The patient walks independently  He does have swelling in the right leg and edema in the thigh, there is some joint effusion there are no signs of infection the wound looks clean dry and intact  He is ready to return to work part-time 3 days a week and then on February 16 can return full-time  He will return in 1 month for me to check on his range of motion  He is to continue physical therapy and continue ice to control swelling    "

## 2024-08-22 ENCOUNTER — Encounter (HOSPITAL_COMMUNITY): Payer: Self-pay

## 2024-08-22 ENCOUNTER — Ambulatory Visit (HOSPITAL_COMMUNITY)

## 2024-08-22 DIAGNOSIS — M25661 Stiffness of right knee, not elsewhere classified: Secondary | ICD-10-CM | POA: Diagnosis not present

## 2024-08-22 DIAGNOSIS — R262 Difficulty in walking, not elsewhere classified: Secondary | ICD-10-CM

## 2024-08-22 DIAGNOSIS — R600 Localized edema: Secondary | ICD-10-CM

## 2024-08-22 NOTE — Therapy (Addendum)
 " OUTPATIENT PHYSICAL THERAPY LOWER EXTREMITY TREATMENT  Patient Name: Dillon [name] MRN: [id number] DOB:April 01, 1956, 69 y.o., male Today's Date: 08/22/2024  END OF SESSION:  PT End of Session - 08/22/24 1458     Visit Number 5    Number of Visits 12    Date for Recertification  10/04/24    Authorization Type [employer]    Authorization Time Period No authorization required    PT Start Time 1500    PT Stop Time 1542    PT Time Calculation (min) 42 min    Activity Tolerance Patient tolerated treatment well    Behavior During Therapy WFL for tasks assessed/performed              Past Medical History:  Diagnosis Date   Arthritis    GERD (gastroesophageal reflux disease)    Hypertension    Lumbar spondylosis    Past Surgical History:  Procedure Laterality Date   COLONOSCOPY N/A 01/24/2019   Procedure: COLONOSCOPY;  Surgeon: Golda Claudis PENNER, MD;  Location: [facility];  Service: Endoscopy;  Laterality: N/A;  1030   COLONOSCOPY N/A 03/07/2024   Procedure: COLONOSCOPY;  Surgeon: Cinderella Deatrice FALCON, MD;  Location: [facility];  Service: Endoscopy;  Laterality: N/A;  10:00 am, asa 1/2   HAND SURGERY Left    closed reduction left hand   JOINT REPLACEMENT Right    Ankle replacement   KNEE SURGERY Right    meniscetomy   POLYPECTOMY  01/24/2019   Procedure: POLYPECTOMY;  Surgeon: Golda Claudis PENNER, MD;  Location: [facility];  Service: Endoscopy;;  colon   TOTAL HIP ARTHROPLASTY Left 08/24/2021   Procedure: TOTAL HIP ARTHROPLASTY;  Surgeon: Margrette Taft FORBES, MD;  Location: [facility];  Service: Orthopedics;  Laterality: Left;   TOTAL KNEE ARTHROPLASTY Right 07/23/2024   Procedure: ARTHROPLASTY, KNEE, TOTAL;  Surgeon: Margrette Taft FORBES, MD;  Location: [facility];  Service: Orthopedics;  Laterality: Right;   WRIST FRACTURE SURGERY     Patient Active Problem List   Diagnosis Date Noted   S/P total knee replacement, right 07/23/24 08/05/2024   Primary osteoarthritis of  right knee 07/23/2024   Adenomatous polyp of transverse colon 03/07/2024   Essential hypertension 01/03/2024   GERD (gastroesophageal reflux disease) 01/03/2024   Hyperlipidemia 12/20/2022   Annual physical exam 12/16/2021   S/P hip replacement, left 08/24/21 09/02/2021    PCP: Bluford Jacqulyn MATSU, DO   REFERRING PROVIDER: Margrette Taft FORBES, MD   REFERRING DIAG: Primary osteoarthritis of right knee   THERAPY DIAG:  Stiffness of right knee, not elsewhere classified  Difficulty in walking, not elsewhere classified  Localized edema  Rationale for Evaluation and Treatment: Rehabilitation  ONSET DATE: 07/23/24  SUBJECTIVE:   SUBJECTIVE STATEMENT: Pt reports feeling good today. No stiffness in the joint, just general stiffness from the swelling. He reports that his post-op follow up went well, with no concerns of current progress.  Evaluation:  Patient reports that he had a right knee replacement on 07/23/24. He notes that the knee is not hurting, but the tourniquet pain is very bad. He had home health physical therapy which ended yesterday. He was able to get to 95 degrees of knee flexion yesterday. He uses a SPC for ambulation, but he will use a RW if he walks outside for 15+ minutes. He has been using his CPM machine at home. He has been walking a half block everyday at home.   PERTINENT HISTORY: HTN and OA PAIN:  Are you having pain? No  PRECAUTIONS: None  RED FLAGS: None   WEIGHT BEARING RESTRICTIONS: No  FALLS:  Has patient fallen in last 6 months? 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  DIAGNOSTIC FINDINGS: 07/23/24 right knee x-ray IMPRESSION: Expected changes post right total knee arthroplasty.  PATIENT SURVEYS:  LEFS  Extreme difficulty/unable (0), Quite a bit of difficulty (1), Moderate difficulty (2), Little difficulty (3), No difficulty (4) Survey date:  08/06/24  Any of your usual work, housework or school activities 2  2. Usual hobbies, recreational or sporting activities 0  3. Getting into/out of the bath 0  4. Walking between rooms 4  5. Putting on socks/shoes 0  6. Squatting  3  7. Lifting an object, like a bag of groceries from the floor 4  8. Performing light activities around your home 4  9. Performing heavy activities around your home 0  10. Getting into/out of a car 3  11. Walking 2 blocks 0  12. Walking 1 mile 0  13. Going up/down 10 stairs (1 flight) 3  14. Standing for 1 hour 1  15.  sitting for 1 hour 4  16. Running on even ground 0  17. Running on uneven ground 0  18. Making sharp turns while running fast 0  19. Hopping  0  20. Rolling over in bed 4  Score total:  29/80     COGNITION: Overall cognitive status: Within functional limits for tasks assessed     SENSATION: Patient reports no numbness or tingling.   EDEMA:  Circumferential: Right tibiofemoral joint line: 48.7 cm Left tibiofemoral joint line: 42.7 cm   PALPATION: TTP: right IT band and TFL   LOWER EXTREMITY ROM:  Active ROM Right eval Left eval  Hip flexion    Hip extension    Hip abduction    Hip adduction    Hip internal rotation    Hip external rotation    Knee flexion 85 PROM: 95 degrees 124  Knee extension 4 0  Ankle dorsiflexion    Ankle plantarflexion    Ankle inversion    Ankle eversion     (Blank rows = not tested)  LOWER EXTREMITY MMT: not tested due to surgical condition  MMT Right eval Left eval   Hip flexion    Hip extension    Hip abduction    Hip adduction    Hip internal rotation    Hip external rotation    Knee flexion    Knee extension    Ankle dorsiflexion    Ankle plantarflexion    Ankle inversion    Ankle eversion     (Blank rows = not tested)  FUNCTIONAL TESTS:  2 minute walk test: 301 feet with SPC   GAIT: Distance walked: 301 feet Assistive device utilized: Single point cane Level of assistance: Modified independence Comments: decreased stride length, gait speed, and right ankle external rotation  TREATMENT DATE:                                       08/22/24 EXERCISE LOG  Exercise Repetitions and Resistance Comments  Recumbent bike  Seat 19 x 8 minutes Full rotations.  Lunge stretch 12 inch step x 3 minutes   Manual Therapy  STM to quads and IT Band. Palpable trigger points in quads addressed. PROM performed to encourage knee flexion. Max 110 degrees.  Kettle Bell Leg Extension on 12 inch step 2 x 15 reps each side Bilateral UE support from parallel bars.  Rocker board Forward and backward taps x 2 minutes Minimal support from UE with parallel bars.  Tandem stance on foam  More difficult with right leg in the back. Intermittent use of UE support with parallel bars.  Putting with PVC pipe and tennis ball  Beginning return to functional activities/limitations.   Blank cell = exercise not performed today                                     08/19/24 EXERCISE LOG  Exercise Repetitions and Resistance Comments  Recumbent bike  L1 x 5 minutes @ seat 15   Lunge stretch  2 minutes w/ 5 second hold x 6 inch step   Hamstring stretch 2 minutes w/ 5 second hold   LAQ 15 reps x 5#   Marches  20 reps each side x 5 # Bilateral UE support with parallel bars  Step ups 15 each side x 6 inch step Bilateral UE support  with parallel bars   Eccentric step downs  15 each side x 6 inch step Bilateral UE support with parallel bars  Tandem stance on foam  2 x 30 seconds Intermittent Bilateral UE support. Moderate loss of balance, more significant with left foot in front.  Clock taps on foam 10 reps each side Unable to complete with no UE support. Completed with unilateral UE support with parallel bars  Hamstring curls 6 plates x 15 reps Unilateral   Manual therapy  STM to quads and lateral thigh. Trigger points in quads and IT band. PROM knee flexion, max 102 degrees.   Blank cell = exercise not performed today                                    08/15/24 EXERCISE LOG  Exercise Repetitions and Resistance Comments  Recumbent bike  L1 x 8 minutes @ seat 19    Lunges onto step  12 step x 2 minutes    Thomas stretch  4 x 30 seconds  RLE only   Manual therapy   STM to right IT band and quadriceps PROM with focus on knee flexion  Max 100 degrees   Stairs  1 set low steps (7 steps) 1 set standard steps (4 steps)  With reciprocal pattern  Lateral step ups  6 step x 10 reps    Step up  6 step x 10 reps  RLE leading   Blank cell = exercise not performed today   PATIENT EDUCATION:  Education details: HEP, POC, healing, anatomy, expectations following surgery, prognosis, objective findings, healing, benefits of walking, and goals for physical therapy Person educated: Patient Education method: Explanation Education comprehension:  verbalized understanding  HOME EXERCISE PROGRAM:   ASSESSMENT:  CLINICAL IMPRESSION: Pt continued to progress LE strength and balance interventions. He needed minimal assistance from parallel bars when completing balance interventions. STM was performed to quad and lateral thigh, palpable trigger points in the quads and IT band relieved quickly with compression. PROM performed to encourage knee flexion, max knee flexion measured 110 degrees. Pt began addressing functional deficits preventing recreational  activities with putting. He reports feeling no soreness or pain at the end of session today. Pt will continue to benefit from skilled physical therapy to address mobility, balance, and functional deficits to improve current activity limitations.  Patient was progressed with multiple new interventions for improved right knee flexion. He required minimal cueing with today's new exercises for proper positioning. Manual therapy focused on improved knee flexion through the use of soft tissue mobilization and passive range of motion with overpressure. This was followed by appropriately matched interventions for improved knee flexion with good results. He was able to achieve 100 degrees of passive right knee flexion. He reported that his knee felt better upon the conclusion of treatment. Patient continues to require skilled physical therapy to address her remaining impairments to return to her prior level of function.   OBJECTIVE IMPAIRMENTS: Abnormal gait, decreased activity tolerance, decreased endurance, decreased mobility, difficulty walking, decreased ROM, decreased strength, hypomobility, increased edema, and pain.   ACTIVITY LIMITATIONS: lifting, standing, squatting, stairs, bed mobility, dressing, and locomotion level  PARTICIPATION LIMITATIONS: driving, shopping, community activity, occupation, and yard work  PERSONAL FACTORS: Past/current experiences and 1-2 comorbidities: HTN and OA are also affecting patient's functional outcome.   REHAB POTENTIAL: Excellent  CLINICAL DECISION MAKING: Stable/uncomplicated  EVALUATION COMPLEXITY: Low   GOALS: Goals reviewed with patient? Yes  SHORT TERM GOALS: Target date: 08/27/24 Patient will be independent with his initial HEP.  Baseline: Goal status: INITIAL  2.  Patient will be able to demonstrate at least 120 degrees of active right knee flexion for improved function navigating stairs.  Baseline:  Goal status: INITIAL  3.  Patient will improve  his LEFS score to at least 50/80 for improved perceived function with his daily activities.  Baseline:  Goal status: INITIAL  4.  Patient will be able to walk at least 200 feet without an assistive device for improved household mobility.  Baseline:  Goal status: INITIAL  LONG TERM GOALS: Target date: 09/17/24  Patient will be independent with his advanced HEP.  Baseline:  Goal status: INITIAL  2.  Patient will be able to navigate at least 4 steps with a reciprocal pattern for improved household mobility.  Baseline:  Goal status: INITIAL  3.  Patient will improve his two minute walk test distance by at least 80 feet for improved community mobility.  Baseline:  Goal status: INITIAL  4.  Patient will be able to squat at lift at least 30 pounds with proper biomechanics to return to his critical job demands.  Baseline:  Goal status: INITIAL  PLAN:  PT FREQUENCY: 2x/week  PT DURATION: 6 weeks  PLANNED INTERVENTIONS: 97164- PT Re-evaluation, 97750- Physical Performance Testing, 97110-Therapeutic exercises, 97530- Therapeutic activity, [id number]- Neuromuscular re-education, 97535- Self Care, [id number]- Manual therapy, [phone]- Gait training, Patient/Family education, Balance training, Stair training, Joint mobilization, Scar mobilization, Cryotherapy, and Moist heat  PLAN FOR NEXT SESSION: continue to progress LE strength, balance and encourage knee flexion. Continue manual therapy as needed.   Venus Galeazzi, Student-PT 08/22/2024, 5:39 PM   Today's treatment was completed with direct  1:1 supervision by Lacinda Fass, PT, DPT  "

## 2024-08-26 ENCOUNTER — Ambulatory Visit (HOSPITAL_COMMUNITY)

## 2024-08-26 ENCOUNTER — Encounter (HOSPITAL_COMMUNITY): Payer: Self-pay

## 2024-08-26 DIAGNOSIS — M25661 Stiffness of right knee, not elsewhere classified: Secondary | ICD-10-CM | POA: Diagnosis not present

## 2024-08-26 DIAGNOSIS — R600 Localized edema: Secondary | ICD-10-CM

## 2024-08-26 DIAGNOSIS — R262 Difficulty in walking, not elsewhere classified: Secondary | ICD-10-CM

## 2024-08-26 NOTE — Therapy (Addendum)
 " OUTPATIENT PHYSICAL THERAPY LOWER EXTREMITY TREATMENT  Patient Name: Dillon [name] MRN: [id number] DOB:1956-06-22, 69 y.o., male Today's Date: 08/26/2024  END OF SESSION:  PT End of Session - 08/26/24 0812     Visit Number 6    Number of Visits 12    Date for Recertification  10/04/24    Authorization Type [employer]    Authorization Time Period No authorization required    PT Start Time 0813    PT Stop Time 0854    PT Time Calculation (min) 41 min    Activity Tolerance Patient tolerated treatment well    Behavior During Therapy [facility] for tasks assessed/performed               Past Medical History:  Diagnosis Date   Arthritis    GERD (gastroesophageal reflux disease)    Hypertension    Lumbar spondylosis    Past Surgical History:  Procedure Laterality Date   COLONOSCOPY N/A 01/24/2019   Procedure: COLONOSCOPY;  Surgeon: Golda Claudis PENNER, MD;  Location: [facility];  Service: Endoscopy;  Laterality: N/A;  1030   COLONOSCOPY N/A 03/07/2024   Procedure: COLONOSCOPY;  Surgeon: Cinderella Deatrice FALCON, MD;  Location: [facility];  Service: Endoscopy;  Laterality: N/A;  10:00 am, asa 1/2   HAND SURGERY Left    closed reduction left hand   JOINT REPLACEMENT Right    Ankle replacement   KNEE SURGERY Right    meniscetomy   POLYPECTOMY  01/24/2019   Procedure: POLYPECTOMY;  Surgeon: Golda Claudis PENNER, MD;  Location: [facility];  Service: Endoscopy;;  colon   TOTAL HIP ARTHROPLASTY Left 08/24/2021   Procedure: TOTAL HIP ARTHROPLASTY;  Surgeon: Margrette Taft FORBES, MD;  Location: [facility];  Service: Orthopedics;  Laterality: Left;   TOTAL KNEE ARTHROPLASTY Right 07/23/2024   Procedure: ARTHROPLASTY, KNEE, TOTAL;  Surgeon: Margrette Taft FORBES, MD;  Location: [facility];  Service: Orthopedics;  Laterality: Right;   WRIST FRACTURE SURGERY     Patient Active Problem List   Diagnosis Date Noted   S/P total knee replacement, right 07/23/24 08/05/2024   Primary osteoarthritis of  right knee 07/23/2024   Adenomatous polyp of transverse colon 03/07/2024   Essential hypertension 01/03/2024   GERD (gastroesophageal reflux disease) 01/03/2024   Hyperlipidemia 12/20/2022   Annual physical exam 12/16/2021   S/P hip replacement, left 08/24/21 09/02/2021    PCP: Bluford Jacqulyn MATSU, DO   REFERRING PROVIDER: Margrette Taft FORBES, MD   REFERRING DIAG: Primary osteoarthritis of right knee   THERAPY DIAG:  Stiffness of right knee, not elsewhere classified  Difficulty in walking, not elsewhere classified  Localized edema  Rationale for Evaluation and Treatment: Rehabilitation  ONSET DATE: 07/23/24  SUBJECTIVE:   SUBJECTIVE STATEMENT: Pt reports no pain in the knee, just tightness. He has been wearing a compression sleeve on the knee and reports swelling has improved since the last visit.   Evaluation:  Patient reports that he had a right knee replacement on 07/23/24. He notes that the knee is not hurting, but the tourniquet pain is very bad. He had home health physical therapy which ended yesterday. He was able to get to 95 degrees of knee flexion yesterday. He uses a SPC for ambulation, but he will use a RW if he walks outside for 15+ minutes. He has been using his CPM machine at home. He has been walking a half block everyday at home.   PERTINENT HISTORY: HTN and OA PAIN:  Are you having pain? No  PRECAUTIONS: None  RED FLAGS: None   WEIGHT BEARING RESTRICTIONS: No  FALLS:  Has patient fallen in last 6 months? 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  DIAGNOSTIC FINDINGS: 07/23/24 right knee x-ray IMPRESSION: Expected changes post right total knee arthroplasty.  PATIENT SURVEYS:  LEFS  Extreme difficulty/unable (0), Quite a bit of difficulty (1), Moderate difficulty (2), Little difficulty (3), No difficulty (4) Survey date:  08/06/24  Any of your usual work, housework or school activities 2  2. Usual hobbies, recreational or sporting activities 0  3. Getting into/out of the bath 0  4. Walking between rooms 4  5. Putting on socks/shoes 0  6. Squatting  3  7. Lifting an object, like a bag of groceries from the floor 4  8. Performing light activities around your home 4  9. Performing heavy activities around your home 0  10. Getting into/out of a car 3  11. Walking 2 blocks 0  12. Walking 1 mile 0  13. Going up/down 10 stairs (1 flight) 3  14. Standing for 1 hour 1  15.  sitting for 1 hour 4  16. Running on even ground 0  17. Running on uneven ground 0  18. Making sharp turns while running fast 0  19. Hopping  0  20. Rolling over in bed 4  Score total:  29/80     COGNITION: Overall cognitive status: Within functional limits for tasks assessed     SENSATION: Patient reports no numbness or tingling.   EDEMA:  Circumferential: Right tibiofemoral joint line: 48.7 cm Left tibiofemoral joint line: 42.7 cm   PALPATION: TTP: right IT band and TFL   LOWER EXTREMITY ROM:  Active ROM Right eval Left eval  Hip flexion    Hip extension    Hip abduction    Hip adduction    Hip internal rotation    Hip external rotation    Knee flexion 85 PROM: 95 degrees 124  Knee extension 4 0  Ankle dorsiflexion    Ankle plantarflexion    Ankle inversion    Ankle eversion     (Blank rows = not tested)  LOWER EXTREMITY MMT: not tested due to surgical condition  MMT Right eval Left eval  Hip flexion    Hip extension     Hip abduction    Hip adduction    Hip internal rotation    Hip external rotation    Knee flexion    Knee extension    Ankle dorsiflexion    Ankle plantarflexion    Ankle inversion    Ankle eversion     (Blank rows = not tested)  FUNCTIONAL TESTS:  2 minute walk test: 301 feet with SPC   GAIT: Distance walked: 301 feet Assistive device utilized: Single point cane Level of assistance: Modified independence Comments: decreased stride length, gait speed, and right ankle external rotation  TREATMENT DATE:                                      08/26/24 EXERCISE LOG  Exercise Repetitions and Resistance Comments  Recumbent Bike  Seat 18 x 5 minutes Full rotations, forward and backward.  Lunge stretch 12 in step x 2 minutes   Manual Therapy  STM to quads and IT band to address palpable trigger points. PROM knee flexion; max 117 degrees. AROM knee flexion; max 106.  Straight leg raise 15 reps each leg x 5 lb   Sit to stand with tidal tank 10 reps Pt reports pain on anterior knee with sit to stand. Cueing to put more weight through surgical leg.  Single leg stance 2 x 30 seconds each leg  Intermittent need for bilateral UE support from parallel bars, moderate loss of balance.  Marching on foam 2 minutes alternating LE No UE support. Decreased time and eccentric control when standing on right leg, when compared to left leg.  Banded TKE 20 reps each side x blue theraband   Stairs 1 set 6 inch stairs, up and down No pain, lack of eccentric control when descending.  Eccentric step downs 1 minute each side   Hamstring curls 15 reps each side x 6 plates Unilateral  Walking 180 ft for gait analysis Hip hiking on the left leg.    Blank cell = exercise not performed today                                     08/22/24 EXERCISE LOG  Exercise Repetitions and Resistance  Comments  Recumbent bike  Seat 19 x 8 minutes Full rotations.  Lunge stretch 12 inch step x 3 minutes   Manual Therapy  STM to quads and IT Band. Palpable trigger points in quads addressed. PROM performed to encourage knee flexion. Max 110 degrees.  Kettle Bell Leg Extension on 12 inch step 2 x 15 reps each side Bilateral UE support from parallel bars.  Rocker board Forward and backward taps x 2 minutes Minimal support from UE with parallel bars.  Tandem stance on foam  More difficult with right leg in the back. Intermittent use of UE support with parallel bars.  Putting with PVC pipe and tennis ball  Beginning return to functional activities/limitations.   Blank cell = exercise not performed today                                     08/19/24 EXERCISE LOG  Exercise Repetitions and Resistance Comments  Recumbent bike  L1 x 5 minutes @ seat 15   Lunge stretch  2 minutes w/ 5 second hold x 6 inch step   Hamstring stretch 2 minutes w/ 5 second hold   LAQ 15 reps x 5#   Marches  20 reps each side x 5 # Bilateral UE support with parallel bars  Step ups 15 each side x 6 inch step Bilateral UE support  with parallel bars  Eccentric step downs  15 each side x 6 inch step Bilateral UE support with parallel bars  Tandem stance on foam  2 x 30 seconds Intermittent Bilateral UE support. Moderate loss of balance, more significant with left foot in front.  Clock taps on foam 10 reps each side Unable to complete with no UE support. Completed with unilateral UE support with parallel bars  Hamstring curls 6 plates x 15 reps Unilateral   Manual therapy  STM to quads and lateral thigh. Trigger points in quads and IT band. PROM knee flexion, max 102 degrees.   Blank cell = exercise not performed today   PATIENT EDUCATION:  Education details: HEP, POC, healing, anatomy, expectations following surgery, prognosis, objective findings, healing, benefits of walking, and goals for physical therapy Person educated:  Patient Education method: Explanation Education comprehension: verbalized understanding  HOME EXERCISE PROGRAM:   ASSESSMENT:  CLINICAL IMPRESSION: Pt continues to tolerate progressed LE strength and balance interventions well. He demonstrated increased active range of motion on the recumbent bike because of new seat positioning. STM performed to the quads and lateral leg, with palpable trigger points being relieved. Pt was able to get 106 degrees of knee flexion actively and 117 degrees of knee flexion passively, improved from 110 degrees last session. He was introduced to new balance interventions and tolerated them well. He began progressing from tandem stance to single leg stance with need for intermittent support from parallel bars. He walked 180 ft and demonstrated one set of stairs to assess current deficits in gait. He reported feeling no pain or soreness after today's session. Pt will continue to benefit from skilled physical therapy to address LE mobility, strength, and balance deficits to improve functional limitations.  OBJECTIVE IMPAIRMENTS: Abnormal gait, decreased activity tolerance, decreased endurance, decreased mobility, difficulty walking, decreased ROM, decreased strength, hypomobility, increased edema, and pain.   ACTIVITY LIMITATIONS: lifting, standing, squatting, stairs, bed mobility, dressing, and locomotion level  PARTICIPATION LIMITATIONS: driving, shopping, community activity, occupation, and yard work  PERSONAL FACTORS: Past/current experiences and 1-2 comorbidities: HTN and OA are also affecting patient's functional outcome.   REHAB POTENTIAL: Excellent  CLINICAL DECISION MAKING: Stable/uncomplicated  EVALUATION COMPLEXITY: Low   GOALS: Goals reviewed with patient? Yes  SHORT TERM GOALS: Target date: 08/27/24 Patient will be independent with his initial HEP.  Baseline: Goal status: INITIAL  2.  Patient will be able to demonstrate at least 120 degrees of  active right knee flexion for improved function navigating stairs.  Baseline:  Goal status: INITIAL  3.  Patient will improve his LEFS score to at least 50/80 for improved perceived function with his daily activities.  Baseline:  Goal status: INITIAL  4.  Patient will be able to walk at least 200 feet without an assistive device for improved household mobility.  Baseline:  Goal status: INITIAL  LONG TERM GOALS: Target date: 09/17/24  Patient will be independent with his advanced HEP.  Baseline:  Goal status: INITIAL  2.  Patient will be able to navigate at least 4 steps with a reciprocal pattern for improved household mobility.  Baseline:  Goal status: INITIAL  3.  Patient will improve his two minute walk test distance by at least 80 feet for improved community mobility.  Baseline:  Goal status: INITIAL  4.  Patient will be able to squat at lift at least 30 pounds with proper biomechanics to return to his critical job demands.  Baseline:  Goal status: INITIAL  PLAN:  PT FREQUENCY: 2x/week  PT DURATION: 6 weeks  PLANNED INTERVENTIONS: 97164- PT Re-evaluation, 97750- Physical Performance Testing, 97110-Therapeutic exercises, 97530- Therapeutic activity, [id number]- Neuromuscular re-education, 97535- Self Care, [id number]- Manual therapy, [phone]- Gait training, Patient/Family education, Balance training, Stair training, Joint mobilization, Scar mobilization, Cryotherapy,  and Moist heat  PLAN FOR NEXT SESSION: Progress LE strength, mobility, and balance interventions. Begin hip abduction strength interventions to improve hip hiking and gait training to encourage proper gait pattern when walking. Introduce lifting exercise for need to lift 45 lbsContinue manual therapy as needed.   Venus Galeazzi, Student-PT 08/26/2024, 9:22 AM   Today's treatment was completed with direct 1:1 supervision by Lacinda Fass, PT, DPT  "

## 2024-08-29 ENCOUNTER — Ambulatory Visit (HOSPITAL_COMMUNITY)

## 2024-08-29 ENCOUNTER — Encounter (HOSPITAL_COMMUNITY): Payer: Self-pay

## 2024-08-29 DIAGNOSIS — M25661 Stiffness of right knee, not elsewhere classified: Secondary | ICD-10-CM | POA: Diagnosis not present

## 2024-08-29 DIAGNOSIS — R600 Localized edema: Secondary | ICD-10-CM

## 2024-08-29 DIAGNOSIS — R262 Difficulty in walking, not elsewhere classified: Secondary | ICD-10-CM

## 2024-08-29 NOTE — Therapy (Addendum)
 " OUTPATIENT PHYSICAL THERAPY LOWER EXTREMITY TREATMENT  Patient Name: Dillon [name] MRN: [id number] DOB:[birth year], 69 y.o., male Today's Date: 08/29/2024  END OF SESSION:  PT End of Session - 08/29/24 1557     Visit Number 7    Number of Visits 12    Date for Recertification  10/04/24    Authorization Type [employer]    Authorization Time Period No authorization required    PT Start Time 1458    PT Stop Time 1542    PT Time Calculation (min) 44 min    Activity Tolerance Patient tolerated treatment well    Behavior During Therapy WFL for tasks assessed/performed                Past Medical History:  Diagnosis Date   Arthritis    GERD (gastroesophageal reflux disease)    Hypertension    Lumbar spondylosis    Past Surgical History:  Procedure Laterality Date   COLONOSCOPY N/A 01/24/2019   Procedure: COLONOSCOPY;  Surgeon: Golda Claudis PENNER, MD;  Location: [facility];  Service: Endoscopy;  Laterality: N/A;  1030   COLONOSCOPY N/A 03/07/2024   Procedure: COLONOSCOPY;  Surgeon: Cinderella Deatrice FALCON, MD;  Location: [facility];  Service: Endoscopy;  Laterality: N/A;  10:00 am, asa 1/2   HAND SURGERY Left    closed reduction left hand   JOINT REPLACEMENT Right    Ankle replacement   KNEE SURGERY Right    meniscetomy   POLYPECTOMY  01/24/2019   Procedure: POLYPECTOMY;  Surgeon: Golda Claudis PENNER, MD;  Location: [facility];  Service: Endoscopy;;  colon   TOTAL HIP ARTHROPLASTY Left 08/24/2021   Procedure: TOTAL HIP ARTHROPLASTY;  Surgeon: Margrette Taft FORBES, MD;  Location: [facility];  Service: Orthopedics;  Laterality: Left;   TOTAL KNEE ARTHROPLASTY Right 07/23/2024   Procedure: ARTHROPLASTY, KNEE, TOTAL;  Surgeon: Margrette Taft FORBES, MD;  Location: [facility];  Service: Orthopedics;  Laterality: Right;   WRIST FRACTURE SURGERY     Patient Active Problem List   Diagnosis Date Noted   S/P total knee replacement, right 07/23/24 08/05/2024   Primary osteoarthritis  of right knee 07/23/2024   Adenomatous polyp of transverse colon 03/07/2024   Essential hypertension 01/03/2024   GERD (gastroesophageal reflux disease) 01/03/2024   Hyperlipidemia 12/20/2022   Annual physical exam 12/16/2021   S/P hip replacement, left 08/24/21 09/02/2021    PCP: Bluford Jacqulyn MATSU, DO   REFERRING PROVIDER: Margrette Taft FORBES, MD   REFERRING DIAG: Primary osteoarthritis of right knee   THERAPY DIAG:  Stiffness of right knee, not elsewhere classified  Difficulty in walking, not elsewhere classified  Localized edema  Rationale for Evaluation and Treatment: Rehabilitation  ONSET DATE: 07/23/24  SUBJECTIVE:   SUBJECTIVE STATEMENT: Pt reports  stiffness in mid thigh but no pain or soreness. He was sore after the last session, but it resolved within 24 hours.  Evaluation:  Patient reports that he had a right knee replacement on 07/23/24. He notes that the knee is not hurting, but the tourniquet pain is very bad. He had home health physical therapy which ended yesterday. He was able to get to 95 degrees of knee flexion yesterday. He uses a SPC for ambulation, but he will use a RW if he walks outside for 15+ minutes. He has been using his CPM machine at home. He has been walking a half block everyday at home.   PERTINENT HISTORY: HTN and OA PAIN:  Are you having  pain? No  PRECAUTIONS: None  RED FLAGS: None   WEIGHT BEARING RESTRICTIONS: No  FALLS:  Has patient fallen in last 6 months? 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  DIAGNOSTIC FINDINGS: 07/23/24 right knee x-ray IMPRESSION: Expected changes post right total knee arthroplasty.  PATIENT SURVEYS:  LEFS  Extreme difficulty/unable (0), Quite a bit of difficulty (1), Moderate difficulty (2), Little difficulty (3), No difficulty (4) Survey date:  08/06/24  Any of your usual work, housework or school activities 2  2. Usual hobbies, recreational or sporting activities 0  3. Getting into/out of the bath 0  4. Walking between rooms 4  5. Putting on socks/shoes 0  6. Squatting  3  7. Lifting an object, like a bag of groceries from the floor 4  8. Performing light activities around your home 4  9. Performing heavy activities around your home 0  10. Getting into/out of a car 3  11. Walking 2 blocks 0  12. Walking 1 mile 0  13. Going up/down 10 stairs (1 flight) 3  14. Standing for 1 hour 1  15.  sitting for 1 hour 4  16. Running on even ground 0  17. Running on uneven ground 0  18. Making sharp turns while running fast 0  19. Hopping  0  20. Rolling over in bed 4  Score total:  29/80     COGNITION: Overall cognitive status: Within functional limits for tasks assessed     SENSATION: Patient reports no numbness or tingling.   EDEMA:  Circumferential: Right tibiofemoral joint line: 48.7 cm Left tibiofemoral joint line: 42.7 cm   PALPATION: TTP: right IT band and TFL   LOWER EXTREMITY ROM:  Active ROM Right eval Left eval  Hip flexion    Hip extension    Hip abduction    Hip adduction    Hip internal rotation    Hip external rotation    Knee flexion 85 PROM: 95 degrees 124  Knee extension 4 0  Ankle dorsiflexion    Ankle plantarflexion    Ankle inversion    Ankle eversion     (Blank rows = not tested)  LOWER EXTREMITY MMT: not tested due to surgical condition  MMT Right eval Left eval  Hip flexion    Hip extension    Hip  abduction    Hip adduction    Hip internal rotation    Hip external rotation    Knee flexion    Knee extension    Ankle dorsiflexion    Ankle plantarflexion    Ankle inversion    Ankle eversion     (Blank rows = not tested)  FUNCTIONAL TESTS:  2 minute walk test: 301 feet with SPC   GAIT: Distance walked: 301 feet Assistive device utilized: Single point cane Level of assistance: Modified independence Comments: decreased stride length, gait speed, and right ankle external rotation  TREATMENT DATE:                                    08/29/2024  EXERCISE LOG  Exercise Repetitions and Resistance Comments  Recumbent Bike Seat 17 x 7 minutes Full rotations.  Lunge stretch  2 minutes x 12 inch step   Manual therapy  STM to mid-low thigh. Palpable trigger points in quad and IT band. PROM: max 120. AROM max 115.  Single leg stance 2 x 30 seconds each leg Intermittent bilateral UE support from parallel bars with moderate losses of balance.  Gait training with tidal tank 2 laps around gym Verbal cueing to keep hips level. Visual cueing from tidal tank to keep water  level to prevent hip hiking.  Eccentric heel taps 15 reps x 10 lbs 8 in step  Squat 23 lb box x 20 reps  Lift box onto and off of 12 inch step  Standing hip abduction 15 reps each side x 10 lbs Unilateral UE support   Blank cell = exercise not performed today                                     08/26/24 EXERCISE LOG  Exercise Repetitions and Resistance Comments  Recumbent Bike  Seat 18 x 5 minutes Full rotations, forward and backward.  Lunge stretch 12 in step x 2 minutes   Manual Therapy  STM to quads and IT band to address palpable trigger points. PROM knee flexion; max 117 degrees. AROM knee flexion; max 106.  Straight leg raise 15 reps each leg x 5 lb   Sit to stand with tidal tank 10 reps Pt  reports pain on anterior knee with sit to stand. Cueing to put more weight through surgical leg.  Single leg stance 2 x 30 seconds each leg  Intermittent need for bilateral UE support from parallel bars, moderate loss of balance.  Marching on foam 2 minutes alternating LE No UE support. Decreased time and eccentric control when standing on right leg, when compared to left leg.  Banded TKE 20 reps each side x blue theraband   Stairs 1 set 6 inch stairs, up and down No pain, lack of eccentric control when descending.  Eccentric step downs 1 minute each side   Hamstring curls 15 reps each side x 6 plates Unilateral  Walking 180 ft for gait analysis Hip hiking on the left leg.    Blank cell = exercise not performed today                                     08/22/24 EXERCISE LOG  Exercise Repetitions and Resistance Comments  Recumbent bike  Seat 19 x 8 minutes Full rotations.  Lunge stretch 12 inch step x 3 minutes   Manual Therapy  STM to quads and IT Band. Palpable trigger points in quads addressed. PROM performed to encourage knee flexion. Max 110 degrees.  Kettle Bell Leg Extension on 12 inch step 2 x 15 reps each side Bilateral UE support from parallel bars.  Rocker board Forward and backward taps x 2 minutes Minimal support from UE with parallel bars.  Tandem stance on foam  More difficult with right leg in the back. Intermittent use of UE support with  parallel bars.  Putting with PVC pipe and tennis ball  Beginning return to functional activities/limitations.   Blank cell = exercise not performed today    PATIENT EDUCATION:  Education details: HEP, POC, healing, anatomy, expectations following surgery, prognosis, objective findings, healing, benefits of walking, and goals for physical therapy Person educated: Patient Education method: Explanation Education comprehension: verbalized understanding  HOME EXERCISE PROGRAM:   ASSESSMENT:  CLINICAL IMPRESSION: Pt continues to tolerate  progression of LE strength and balance exercises well. STM was performed to mid-low thigh to release palpable trigger points. PROM was performed with max knee flexion reaching 120 degrees. AROM was performed with max knee flexion being 115 degrees. He was introduced to gait training interventions today to decrease hip hiking during gait. He was also introduced to hip abduction intervention to strength gluteus medius to decrease hip hiking during gait. He began a lifting intervention to work towards his goal of being able to lift 30 lbs. Pt will continue to benefit from skilled physical therapy to address deficits in LE strengt, mobility, balance to decrease functional limitations.  OBJECTIVE IMPAIRMENTS: Abnormal gait, decreased activity tolerance, decreased endurance, decreased mobility, difficulty walking, decreased ROM, decreased strength, hypomobility, increased edema, and pain.   ACTIVITY LIMITATIONS: lifting, standing, squatting, stairs, bed mobility, dressing, and locomotion level  PARTICIPATION LIMITATIONS: driving, shopping, community activity, occupation, and yard work  PERSONAL FACTORS: Past/current experiences and 1-2 comorbidities: HTN and OA are also affecting patient's functional outcome.   REHAB POTENTIAL: Excellent  CLINICAL DECISION MAKING: Stable/uncomplicated  EVALUATION COMPLEXITY: Low   GOALS: Goals reviewed with patient? Yes  SHORT TERM GOALS: Target date: 08/27/24 Patient will be independent with his initial HEP.  Baseline: Goal status: INITIAL  2.  Patient will be able to demonstrate at least 120 degrees of active right knee flexion for improved function navigating stairs.  Baseline:  Goal status: INITIAL  3.  Patient will improve his LEFS score to at least 50/80 for improved perceived function with his daily activities.  Baseline:  Goal status: INITIAL  4.  Patient will be able to walk at least 200 feet without an assistive device for improved household  mobility.  Baseline:  Goal status: INITIAL  LONG TERM GOALS: Target date: 09/17/24  Patient will be independent with his advanced HEP.  Baseline:  Goal status: INITIAL  2.  Patient will be able to navigate at least 4 steps with a reciprocal pattern for improved household mobility.  Baseline:  Goal status: INITIAL  3.  Patient will improve his two minute walk test distance by at least 80 feet for improved community mobility.  Baseline:  Goal status: INITIAL  4.  Patient will be able to squat at lift at least 30 pounds with proper biomechanics to return to his critical job demands.  Baseline:  Goal status: INITIAL  PLAN:  PT FREQUENCY: 2x/week  PT DURATION: 6 weeks  PLANNED INTERVENTIONS: [id number]- PT Re-evaluation, 97750- Physical Performance Testing, 97110-Therapeutic exercises, 97530- Therapeutic activity, [id number]- Neuromuscular re-education, 97535- Self Care, [id number]- Manual therapy, [phone]- Gait training, Patient/Family education, Balance training, Stair training, Joint mobilization, Scar mobilization, Cryotherapy, and Moist heat  PLAN FOR NEXT SESSION: Progress LE strength, mobility, and balance interventions. Progress hip abduction strength interventions to improve hip hiking and gait training to encourage proper gait pattern when walking. Progress lifting exercise for need to lift 45 lbs. Continue manual therapy as needed.   Venus Galeazzi, Student-PT 08/29/2024, 5:43 PM   Today's treatment was completed with direct 1:1 supervision and  direction by Lacinda Fass, PT, DPT  "

## 2024-09-03 ENCOUNTER — Ambulatory Visit (HOSPITAL_COMMUNITY): Admitting: Physical Therapy

## 2024-09-03 DIAGNOSIS — R600 Localized edema: Secondary | ICD-10-CM

## 2024-09-03 DIAGNOSIS — R262 Difficulty in walking, not elsewhere classified: Secondary | ICD-10-CM

## 2024-09-03 DIAGNOSIS — M25661 Stiffness of right knee, not elsewhere classified: Secondary | ICD-10-CM | POA: Diagnosis not present

## 2024-09-03 NOTE — Therapy (Signed)
 " OUTPATIENT PHYSICAL THERAPY LOWER EXTREMITY TREATMENT  Patient Name: Dillon [name] MRN: [id number] DOB:1956/05/16, 69 y.o., male Today's Date: 09/03/2024  END OF SESSION:  PT End of Session - 09/03/24 0904     Number of Visits 12    Date for Recertification  10/04/24    Authorization Type [employer]    Authorization Time Period No authorization required    PT Start Time 0818    PT Stop Time 0850    PT Time Calculation (min) 32 min    Activity Tolerance Patient tolerated treatment well    Behavior During Therapy [facility] for tasks assessed/performed                 Past Medical History:  Diagnosis Date   Arthritis    GERD (gastroesophageal reflux disease)    Hypertension    Lumbar spondylosis    Past Surgical History:  Procedure Laterality Date   COLONOSCOPY N/A 01/24/2019   Procedure: COLONOSCOPY;  Surgeon: Golda Claudis PENNER, MD;  Location: [facility];  Service: Endoscopy;  Laterality: N/A;  1030   COLONOSCOPY N/A 03/07/2024   Procedure: COLONOSCOPY;  Surgeon: Cinderella Deatrice FALCON, MD;  Location: [facility];  Service: Endoscopy;  Laterality: N/A;  10:00 am, asa 1/2   HAND SURGERY Left    closed reduction left hand   JOINT REPLACEMENT Right    Ankle replacement   KNEE SURGERY Right    meniscetomy   POLYPECTOMY  01/24/2019   Procedure: POLYPECTOMY;  Surgeon: Golda Claudis PENNER, MD;  Location: [facility];  Service: Endoscopy;;  colon   TOTAL HIP ARTHROPLASTY Left 08/24/2021   Procedure: TOTAL HIP ARTHROPLASTY;  Surgeon: Margrette Taft FORBES, MD;  Location: [facility];  Service: Orthopedics;  Laterality: Left;   TOTAL KNEE ARTHROPLASTY Right 07/23/2024   Procedure: ARTHROPLASTY, KNEE, TOTAL;  Surgeon: Margrette Taft FORBES, MD;  Location: [facility];  Service: Orthopedics;  Laterality: Right;   WRIST FRACTURE SURGERY     Patient Active Problem List   Diagnosis Date Noted   S/P total knee replacement, right 07/23/24 08/05/2024   Primary osteoarthritis of right knee  07/23/2024   Adenomatous polyp of transverse colon 03/07/2024   Essential hypertension 01/03/2024   GERD (gastroesophageal reflux disease) 01/03/2024   Hyperlipidemia 12/20/2022   Annual physical exam 12/16/2021   S/P hip replacement, left 08/24/21 09/02/2021    PCP: Bluford Jacqulyn MATSU, DO   REFERRING PROVIDER: Margrette Taft FORBES, MD   REFERRING DIAG: Primary osteoarthritis of right knee   THERAPY DIAG:  Stiffness of right knee, not elsewhere classified  Difficulty in walking, not elsewhere classified  Localized edema  Rationale for Evaluation and Treatment: Rehabilitation  ONSET DATE: 07/23/24  SUBJECTIVE:   SUBJECTIVE STATEMENT: Pt reports just stiffness not real pain. Only limitation is his balance, feels it's getting stronger.   Evaluation:  Patient reports that he had a right knee replacement on 07/23/24. He notes that the knee is not hurting, but the tourniquet pain is very bad. He had home health physical therapy which ended yesterday. He was able to get to 95 degrees of knee flexion yesterday. He uses a SPC for ambulation, but he will use a RW if he walks outside for 15+ minutes. He has been using his CPM machine at home. He has been walking a half block everyday at home.   PERTINENT HISTORY: HTN and OA PAIN:  Are you having pain? No  PRECAUTIONS: None  RED FLAGS: None   WEIGHT BEARING  RESTRICTIONS: No  FALLS:  Has patient fallen in last 6 months? 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  DIAGNOSTIC FINDINGS: 07/23/24 right knee x-ray IMPRESSION: Expected changes post right total knee arthroplasty.  PATIENT SURVEYS:  LEFS  Extreme difficulty/unable (0), Quite a bit of difficulty (1), Moderate difficulty (2), Little difficulty (3), No difficulty (4) Survey date:  08/06/24  Any of your usual work, housework or school activities 2  2. Usual hobbies, recreational or sporting activities 0  3. Getting into/out of the bath 0  4. Walking between rooms 4  5. Putting on socks/shoes 0  6. Squatting  3  7. Lifting an object, like a bag of groceries from the floor 4  8. Performing light activities around your home 4  9. Performing heavy activities around your home 0  10. Getting into/out of a car 3  11. Walking 2 blocks 0  12. Walking 1 mile 0  13. Going up/down 10 stairs (1 flight) 3  14. Standing for 1 hour 1  15.  sitting for 1 hour 4  16. Running on even ground 0  17. Running on uneven ground 0  18. Making sharp turns while running fast 0  19. Hopping  0  20. Rolling over in bed 4  Score total:  29/80     COGNITION: Overall cognitive status: Within functional limits for tasks assessed     SENSATION: Patient reports no numbness or tingling.   EDEMA:  Circumferential: Right tibiofemoral joint line: 48.7 cm Left tibiofemoral joint line: 42.7 cm   PALPATION: TTP: right IT band and TFL   LOWER EXTREMITY ROM:  Active ROM Right eval Left eval  Hip flexion    Hip extension    Hip abduction    Hip adduction    Hip internal rotation    Hip external rotation    Knee flexion 85 PROM: 95 degrees 124  Knee extension 4 0  Ankle dorsiflexion    Ankle plantarflexion    Ankle inversion    Ankle eversion     (Blank rows = not tested)  LOWER EXTREMITY MMT: not tested due to surgical condition  MMT Right eval Left eval  Hip flexion    Hip extension    Hip abduction    Hip adduction    Hip internal rotation     Hip external rotation    Knee flexion    Knee extension    Ankle dorsiflexion    Ankle plantarflexion    Ankle inversion    Ankle eversion     (Blank rows = not tested)  FUNCTIONAL TESTS:  2 minute walk test: 301 feet with SPC   GAIT: Distance walked: 301 feet Assistive device utilized: Single point cane Level of assistance: Modified independence Comments: decreased stride length, gait speed, and right ankle external rotation  TREATMENT DATE:   09/03/2024 Bike seat 17, 7 minutes full rotations Standing:  knee flexion stretch with 12 step 10X10  Forward step downs, heel tap for eccentric 8 with bil UE assist 15X  Squat box lift to 12 step and back, 2X10, 20# in 8# smalll box =28# total  SLS max hold each without UE assist Rt:3, Lt:6:  Tandem stance max hold each without UE Rt Leading 30, Lt leading: 10  3 way kick, vectors 5X5 each with 1 UE assist 20 ft line, tandem gait RT Ambulation around clinic with Tidal tank 2RT around gym                                   08/29/2024  EXERCISE LOG  Exercise Repetitions and Resistance Comments  Recumbent Bike Seat 17 x 7 minutes Full rotations.  Lunge stretch  2 minutes x 12 inch step   Manual therapy  STM to mid-low thigh. Palpable trigger points in quad and IT band. PROM: max 120. AROM max 115.  Single leg stance 2 x 30 seconds each leg Intermittent bilateral UE support from parallel bars with moderate losses of balance.  Gait training with tidal tank 2 laps around gym Verbal cueing to keep hips level. Visual cueing from tidal tank to keep water  level to prevent hip hiking.  Eccentric heel taps 15 reps x 10 lbs 8 in step  Squat 23 lb box x 20 reps  Lift box onto and off of 12 inch step  Standing hip abduction 15 reps each side x 10 lbs Unilateral UE support   Blank cell = exercise not performed today                                      08/26/24 EXERCISE LOG  Exercise Repetitions and Resistance Comments  Recumbent Bike  Seat 18 x 5 minutes Full rotations, forward and backward.  Lunge stretch 12 in step x 2 minutes   Manual Therapy  STM to quads and IT band to address palpable trigger points. PROM knee flexion; max 117 degrees. AROM knee flexion; max 106.  Straight leg raise 15 reps each leg x 5 lb   Sit to stand with tidal tank 10 reps Pt reports pain on anterior knee with sit to stand. Cueing to put more weight through surgical leg.  Single leg stance 2 x 30 seconds each leg  Intermittent need for bilateral UE support from parallel bars, moderate loss of balance.  Marching on foam 2 minutes alternating LE No UE support. Decreased time and eccentric control when standing on right leg, when compared to left leg.  Banded TKE 20 reps each side x blue theraband   Stairs 1 set 6 inch stairs, up and down No pain, lack of eccentric control when descending.  Eccentric step downs 1 minute each side   Hamstring curls 15 reps each side x 6 plates Unilateral  Walking 180 ft for gait analysis Hip hiking on the left leg.    Blank cell = exercise not performed today                                     08/22/24 EXERCISE LOG  Exercise Repetitions and Resistance Comments  Recumbent bike  Seat  19 x 8 minutes Full rotations.  Lunge stretch 12 inch step x 3 minutes   Manual Therapy  STM to quads and IT Band. Palpable trigger points in quads addressed. PROM performed to encourage knee flexion. Max 110 degrees.  Kettle Bell Leg Extension on 12 inch step 2 x 15 reps each side Bilateral UE support from parallel bars.  Rocker board Forward and backward taps x 2 minutes Minimal support from UE with parallel bars.  Tandem stance on foam  More difficult with right leg in the back. Intermittent use of UE support with parallel bars.  Putting with PVC pipe and tennis ball  Beginning return to functional  activities/limitations.   Blank cell = exercise not performed today    PATIENT EDUCATION:  Education details: HEP, POC, healing, anatomy, expectations following surgery, prognosis, objective findings, healing, benefits of walking, and goals for physical therapy Person educated: Patient Education method: Explanation Education comprehension: verbalized understanding  HOME EXERCISE PROGRAM:   ASSESSMENT:  CLINICAL IMPRESSION: Good mobility on bike.  Main focus remains on LE strengthening and balance.  Dynamic balance challenge of tandem gait added in addition to static activities. Began vectors, 3 way kick with cues for posture and maintaining full 5 second hold in each position.  Lateral was most challenging.  Continued with tidal tank ambulation to reduce hip hiking during gait. Increased weight in box to 20# (28# total) to progress towards goal of being able to lift 45 lbs. Pt will continue to benefit from skilled physical therapy to address deficits in LE strengt, mobility, balance to decrease functional limitations.  OBJECTIVE IMPAIRMENTS: Abnormal gait, decreased activity tolerance, decreased endurance, decreased mobility, difficulty walking, decreased ROM, decreased strength, hypomobility, increased edema, and pain.   ACTIVITY LIMITATIONS: lifting, standing, squatting, stairs, bed mobility, dressing, and locomotion level  PARTICIPATION LIMITATIONS: driving, shopping, community activity, occupation, and yard work  PERSONAL FACTORS: Past/current experiences and 1-2 comorbidities: HTN and OA are also affecting patient's functional outcome.   REHAB POTENTIAL: Excellent  CLINICAL DECISION MAKING: Stable/uncomplicated  EVALUATION COMPLEXITY: Low   GOALS: Goals reviewed with patient? Yes  SHORT TERM GOALS: Target date: 08/27/24 Patient will be independent with his initial HEP.  Baseline: Goal status: INITIAL  2.  Patient will be able to demonstrate at least 120 degrees of active  right knee flexion for improved function navigating stairs.  Baseline:  Goal status: INITIAL  3.  Patient will improve his LEFS score to at least 50/80 for improved perceived function with his daily activities.  Baseline:  Goal status: INITIAL  4.  Patient will be able to walk at least 200 feet without an assistive device for improved household mobility.  Baseline:  Goal status: INITIAL  LONG TERM GOALS: Target date: 09/17/24  Patient will be independent with his advanced HEP.  Baseline:  Goal status: INITIAL  2.  Patient will be able to navigate at least 4 steps with a reciprocal pattern for improved household mobility.  Baseline:  Goal status: INITIAL  3.  Patient will improve his two minute walk test distance by at least 80 feet for improved community mobility.  Baseline:  Goal status: INITIAL  4.  Patient will be able to squat at lift at least 30 pounds with proper biomechanics to return to his critical job demands.  Baseline:  Goal status: INITIAL  PLAN:  PT FREQUENCY: 2x/week  PT DURATION: 6 weeks  PLANNED INTERVENTIONS: 97164- PT Re-evaluation, 97750- Physical Performance Testing, 97110-Therapeutic exercises, 97530- Therapeutic activity, [id number]- Neuromuscular re-education,  02464- Self Care, [id number]- Manual therapy, [phone]- Gait training, Patient/Family education, Balance training, Stair training, Joint mobilization, Scar mobilization, Cryotherapy, and Moist heat  PLAN FOR NEXT SESSION: Progress LE strength, mobility, and balance interventions. Progress hip abduction strength interventions to improve hip hiking and gait training to encourage proper gait pattern when walking. Progress lifting exercise for need to lift 45 lbs. Continue manual therapy as needed.  Greig KATHEE Fuse, PTA/CLT [facility] [place] Ph: [phone]  Fuse Greig KATHEE, PTA 09/03/2024, 9:05 AM   "

## 2024-09-05 ENCOUNTER — Encounter (HOSPITAL_COMMUNITY): Payer: Self-pay

## 2024-09-05 ENCOUNTER — Ambulatory Visit (HOSPITAL_COMMUNITY)

## 2024-09-05 DIAGNOSIS — M25661 Stiffness of right knee, not elsewhere classified: Secondary | ICD-10-CM | POA: Diagnosis not present

## 2024-09-05 DIAGNOSIS — R262 Difficulty in walking, not elsewhere classified: Secondary | ICD-10-CM

## 2024-09-05 DIAGNOSIS — R600 Localized edema: Secondary | ICD-10-CM

## 2024-09-05 NOTE — Therapy (Addendum)
 " OUTPATIENT PHYSICAL THERAPY LOWER EXTREMITY TREATMENT  Patient Name: Dillon [name] MRN: [id number] DOB:March 18, 1956, 69 y.o., male Today's Date: 09/05/2024  END OF SESSION:  PT End of Session - 09/05/24 0859     Visit Number 9    Number of Visits 12    Date for Recertification  10/04/24    Authorization Type [employer]    Authorization Time Period No authorization required    PT Start Time 0817    PT Stop Time 0858    PT Time Calculation (min) 41 min    Activity Tolerance Patient tolerated treatment well    Behavior During Therapy [facility] for tasks assessed/performed                  Past Medical History:  Diagnosis Date   Arthritis    GERD (gastroesophageal reflux disease)    Hypertension    Lumbar spondylosis    Past Surgical History:  Procedure Laterality Date   COLONOSCOPY N/A 01/24/2019   Procedure: COLONOSCOPY;  Surgeon: Golda Claudis PENNER, MD;  Location: [facility];  Service: Endoscopy;  Laterality: N/A;  1030   COLONOSCOPY N/A 03/07/2024   Procedure: COLONOSCOPY;  Surgeon: Cinderella Deatrice FALCON, MD;  Location: [facility];  Service: Endoscopy;  Laterality: N/A;  10:00 am, asa 1/2   HAND SURGERY Left    closed reduction left hand   JOINT REPLACEMENT Right    Ankle replacement   KNEE SURGERY Right    meniscetomy   POLYPECTOMY  01/24/2019   Procedure: POLYPECTOMY;  Surgeon: Golda Claudis PENNER, MD;  Location: [facility];  Service: Endoscopy;;  colon   TOTAL HIP ARTHROPLASTY Left 08/24/2021   Procedure: TOTAL HIP ARTHROPLASTY;  Surgeon: Margrette Taft FORBES, MD;  Location: [facility];  Service: Orthopedics;  Laterality: Left;   TOTAL KNEE ARTHROPLASTY Right 07/23/2024   Procedure: ARTHROPLASTY, KNEE, TOTAL;  Surgeon: Margrette Taft FORBES, MD;  Location: [facility];  Service: Orthopedics;  Laterality: Right;   WRIST FRACTURE SURGERY     Patient Active Problem List   Diagnosis Date Noted   S/P total knee replacement, right 07/23/24 08/05/2024   Primary  osteoarthritis of right knee 07/23/2024   Adenomatous polyp of transverse colon 03/07/2024   Essential hypertension 01/03/2024   GERD (gastroesophageal reflux disease) 01/03/2024   Hyperlipidemia 12/20/2022   Annual physical exam 12/16/2021   S/P hip replacement, left 08/24/21 09/02/2021    PCP: Bluford Jacqulyn MATSU, DO   REFERRING PROVIDER: Margrette Taft FORBES, MD   REFERRING DIAG: Primary osteoarthritis of right knee   THERAPY DIAG:  Stiffness of right knee, not elsewhere classified  Difficulty in walking, not elsewhere classified  Localized edema  Rationale for Evaluation and Treatment: Rehabilitation  ONSET DATE: 07/23/24  SUBJECTIVE:   SUBJECTIVE STATEMENT: Pt reports no pain, just stiffness in the knee today. Pt was sore yesterday after last session.   Evaluation:  Patient reports that he had a right knee replacement on 07/23/24. He notes that the knee is not hurting, but the tourniquet pain is very bad. He had home health physical therapy which ended yesterday. He was able to get to 95 degrees of knee flexion yesterday. He uses a SPC for ambulation, but he will use a RW if he walks outside for 15+ minutes. He has been using his CPM machine at home. He has been walking a half block everyday at home.   PERTINENT HISTORY: HTN and OA PAIN:  Are you having pain? No  PRECAUTIONS: None  RED FLAGS: None   WEIGHT BEARING RESTRICTIONS: No  FALLS:  Has patient fallen in last 6 months? 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  DIAGNOSTIC FINDINGS: 07/23/24 right knee x-ray IMPRESSION: Expected changes post right total knee arthroplasty.  PATIENT SURVEYS:  LEFS  Extreme difficulty/unable (0), Quite a bit of difficulty (1), Moderate difficulty (2), Little difficulty (3), No difficulty (4) Survey date:  08/06/24  Any of your usual work, housework or school activities 2  2. Usual hobbies, recreational or sporting activities 0  3. Getting into/out of the bath 0  4. Walking between rooms 4  5. Putting on socks/shoes 0  6. Squatting  3  7. Lifting an object, like a bag of groceries from the floor 4  8. Performing light activities around your home 4  9. Performing heavy activities around your home 0  10. Getting into/out of a car 3  11. Walking 2 blocks 0  12. Walking 1 mile 0  13. Going up/down 10 stairs (1 flight) 3  14. Standing for 1 hour 1  15.  sitting for 1 hour 4  16. Running on even ground 0  17. Running on uneven ground 0  18. Making sharp turns while running fast 0  19. Hopping  0  20. Rolling over in bed 4  Score total:  29/80     COGNITION: Overall cognitive status: Within functional limits for tasks assessed     SENSATION: Patient reports no numbness or tingling.   EDEMA:  Circumferential: Right tibiofemoral joint line: 48.7 cm Left tibiofemoral joint line: 42.7 cm   PALPATION: TTP: right IT band and TFL   LOWER EXTREMITY ROM:  Active ROM Right eval Left eval  Hip flexion    Hip extension    Hip abduction    Hip adduction    Hip internal rotation    Hip external rotation    Knee flexion 85 PROM: 95 degrees 124  Knee extension 4 0  Ankle dorsiflexion    Ankle plantarflexion    Ankle inversion    Ankle eversion     (Blank rows = not tested)  LOWER EXTREMITY MMT: not tested due to surgical condition  MMT Right eval Left eval  Hip flexion    Hip extension    Hip abduction    Hip adduction     Hip internal rotation    Hip external rotation    Knee flexion    Knee extension    Ankle dorsiflexion    Ankle plantarflexion    Ankle inversion    Ankle eversion     (Blank rows = not tested)  FUNCTIONAL TESTS:  2 minute walk test: 301 feet with SPC   GAIT: Distance walked: 301 feet Assistive device utilized: Single point cane Level of assistance: Modified independence Comments: decreased stride length, gait speed, and right ankle external rotation  TREATMENT DATE:                                   09/05/24   EXERCISE LOG  Exercise Repetitions and Resistance Comments  Recumbent Bike Seat 16 x 6 minutes Full rotations  Lunge stretch 2 minutes x 12 inch step   Step Up 15 reps x bosu ball Unilateral UE support from parallel bars; leading with RLE  Strength testing Left: 75 lbs  Right: 55 lbs Knee extension/ quads. Hand held dynamometer.  Single leg press 15 reps x 4 plates With RLE   Sled push 37 lbs x 250 m   Side stepping GTB x 29m (2 reps)   Squat with box taps 28 lbs x 20 reps x 2 sets Cueing for proper form.  Tandem stance on foam 2 reps x 30 sec each leg Intermittent loss of balance with bilateral UE support from parallel bars.  Walking with tidal tank 2 laps around clinic Cueing for level hips.  Rocker board 2 minutes x forward and backward Unilateral UE support from parallel bars.   Blank cell = exercise not performed today   09/03/2024 Bike seat 17, 7 minutes full rotations Standing:  knee flexion stretch with 12 step 10X10  Forward step downs, heel tap for eccentric 8 with bil UE assist 15X  Squat box lift to 12 step and back, 2X10, 20# in 8# smalll box =28# total  SLS max hold each without UE assist Rt:3, Lt:6:  Tandem stance max hold each without UE Rt Leading 30, Lt leading: 10  3 way kick, vectors 5X5 each with 1 UE  assist 20 ft line, tandem gait RT Ambulation around clinic with Tidal tank 2RT around gym                                   08/29/2024  EXERCISE LOG  Exercise Repetitions and Resistance Comments  Recumbent Bike Seat 17 x 7 minutes Full rotations.  Lunge stretch  2 minutes x 12 inch step   Manual therapy  STM to mid-low thigh. Palpable trigger points in quad and IT band. PROM: max 120. AROM max 115.  Single leg stance 2 x 30 seconds each leg Intermittent bilateral UE support from parallel bars with moderate losses of balance.  Gait training with tidal tank 2 laps around gym Verbal cueing to keep hips level. Visual cueing from tidal tank to keep water  level to prevent hip hiking.  Eccentric heel taps 15 reps x 10 lbs 8 in step  Squat 23 lb box x 20 reps  Lift box onto and off of 12 inch step  Standing hip abduction 15 reps each side x 10 lbs Unilateral UE support   Blank cell = exercise not performed today    PATIENT EDUCATION:  Education details: HEP, POC, healing, anatomy, expectations following surgery, prognosis, objective findings, healing, benefits of walking, and goals for physical therapy Person educated: Patient Education method: Explanation Education comprehension: verbalized understanding  HOME EXERCISE PROGRAM:   ASSESSMENT:  CLINICAL IMPRESSION: Pt is demonstrating improved mobility in knee flexion on the bike and stretching. When strength tested with hand held dynamometer, the pt was able to produce 20 more lbs of force on the non-surgical leg compared to the surgical leg. He continues to have weakness in right quad muscles, which limits functional activities and  recreational hobbies such as playing golf. He shows decreased gait deviations and increased gait speed when ambulating with the tidal tank. Pt states the visual cueing of the water  helps decrease his sway when walking. He demonstrated improved balance during tandem stance and rocker interventions as evidenced by  his increased duration without upper extremity support. Pt will continue to benefit from skilled physical therapy to address deficits in strength and balance to decrease functional and recreational limitations.  OBJECTIVE IMPAIRMENTS: Abnormal gait, decreased activity tolerance, decreased endurance, decreased mobility, difficulty walking, decreased ROM, decreased strength, hypomobility, increased edema, and pain.   ACTIVITY LIMITATIONS: lifting, standing, squatting, stairs, bed mobility, dressing, and locomotion level  PARTICIPATION LIMITATIONS: driving, shopping, community activity, occupation, and yard work  PERSONAL FACTORS: Past/current experiences and 1-2 comorbidities: HTN and OA are also affecting patient's functional outcome.   REHAB POTENTIAL: Excellent  CLINICAL DECISION MAKING: Stable/uncomplicated  EVALUATION COMPLEXITY: Low   GOALS: Goals reviewed with patient? Yes  SHORT TERM GOALS: Target date: 08/27/24 Patient will be independent with his initial HEP.  Baseline: Goal status: INITIAL  2.  Patient will be able to demonstrate at least 120 degrees of active right knee flexion for improved function navigating stairs.  Baseline:  Goal status: INITIAL  3.  Patient will improve his LEFS score to at least 50/80 for improved perceived function with his daily activities.  Baseline:  Goal status: INITIAL  4.  Patient will be able to walk at least 200 feet without an assistive device for improved household mobility.  Baseline:  Goal status: INITIAL  LONG TERM GOALS: Target date: 09/17/24  Patient will be independent with his advanced HEP.  Baseline:  Goal status: INITIAL  2.  Patient will be able to navigate at least 4 steps with a reciprocal pattern for improved household mobility.  Baseline:  Goal status: INITIAL  3.  Patient will improve his two minute walk test distance by at least 80 feet for improved community mobility.  Baseline:  Goal status: INITIAL  4.   Patient will be able to squat at lift at least 30 pounds with proper biomechanics to return to his critical job demands.  Baseline:  Goal status: INITIAL  PLAN:  PT FREQUENCY: 2x/week  PT DURATION: 6 weeks  PLANNED INTERVENTIONS: 97164- PT Re-evaluation, 97750- Physical Performance Testing, 97110-Therapeutic exercises, 97530- Therapeutic activity, [id number]- Neuromuscular re-education, 97535- Self Care, [id number]- Manual therapy, [phone]- Gait training, Patient/Family education, Balance training, Stair training, Joint mobilization, Scar mobilization, Cryotherapy, and Moist heat  PLAN FOR NEXT SESSION: Progress LE strength (focus on quad), mobility, and balance interventions. Progress hip abduction strength interventions to improve hip hiking and gait training to encourage proper gait pattern when walking. Progress lifting exercise for need to lift 45 lbs. Update HEP.  Jacob  Moore, PT 09/05/2024, 2:48 PM   Today's treatment was directly supervised by therapist below.  Lang Ada, PT, DPT [facility] Office: [phone] 2:48 PM, 09/05/24  "

## 2024-09-09 ENCOUNTER — Ambulatory Visit (HOSPITAL_COMMUNITY)

## 2024-09-09 ENCOUNTER — Encounter (HOSPITAL_COMMUNITY): Payer: Self-pay

## 2024-09-10 ENCOUNTER — Encounter (HOSPITAL_COMMUNITY): Payer: Self-pay

## 2024-09-10 ENCOUNTER — Other Ambulatory Visit: Payer: Self-pay

## 2024-09-10 ENCOUNTER — Ambulatory Visit (HOSPITAL_COMMUNITY)

## 2024-09-10 DIAGNOSIS — R600 Localized edema: Secondary | ICD-10-CM

## 2024-09-10 DIAGNOSIS — R262 Difficulty in walking, not elsewhere classified: Secondary | ICD-10-CM

## 2024-09-10 DIAGNOSIS — M25661 Stiffness of right knee, not elsewhere classified: Secondary | ICD-10-CM

## 2024-09-10 MED ORDER — RABEPRAZOLE SODIUM 20 MG PO TBEC: 20.0000 mg | DELAYED_RELEASE_TABLET | Freq: Every day | ORAL | 1 refills | Status: AC

## 2024-09-10 MED ORDER — FAMOTIDINE 20 MG PO TABS: 20.0000 mg | ORAL_TABLET | Freq: Every day | ORAL | 1 refills | Status: AC

## 2024-09-11 ENCOUNTER — Other Ambulatory Visit: Payer: Self-pay | Admitting: Orthopedic Surgery

## 2024-09-12 ENCOUNTER — Encounter (HOSPITAL_COMMUNITY): Payer: Self-pay

## 2024-09-12 ENCOUNTER — Ambulatory Visit (HOSPITAL_COMMUNITY)

## 2024-09-18 ENCOUNTER — Ambulatory Visit (HOSPITAL_COMMUNITY)

## 2024-09-18 ENCOUNTER — Ambulatory Visit (HOSPITAL_COMMUNITY): Admitting: Physical Therapy

## 2024-09-18 ENCOUNTER — Encounter: Admitting: Orthopedic Surgery

## 2024-09-18 DIAGNOSIS — Z96651 Presence of right artificial knee joint: Secondary | ICD-10-CM

## 2024-09-20 ENCOUNTER — Ambulatory Visit (HOSPITAL_COMMUNITY)
